# Patient Record
Sex: Male | Born: 1945
Health system: Southern US, Community
[De-identification: ages and names within clinical notes are randomized; demographics above are authoritative.]

## PROBLEM LIST (undated history)

## (undated) DIAGNOSIS — J45909 Unspecified asthma, uncomplicated: Secondary | ICD-10-CM

## (undated) DIAGNOSIS — T7840XA Allergy, unspecified, initial encounter: Secondary | ICD-10-CM

## (undated) DIAGNOSIS — I10 Essential (primary) hypertension: Secondary | ICD-10-CM

## (undated) DIAGNOSIS — Z973 Presence of spectacles and contact lenses: Secondary | ICD-10-CM

## (undated) DIAGNOSIS — F32A Depression, unspecified: Secondary | ICD-10-CM

## (undated) DIAGNOSIS — B999 Unspecified infectious disease: Secondary | ICD-10-CM

## (undated) DIAGNOSIS — Z974 Presence of external hearing-aid: Secondary | ICD-10-CM

## (undated) DIAGNOSIS — J342 Deviated nasal septum: Secondary | ICD-10-CM

## (undated) DIAGNOSIS — B1920 Unspecified viral hepatitis C without hepatic coma: Secondary | ICD-10-CM

## (undated) DIAGNOSIS — J189 Pneumonia, unspecified organism: Secondary | ICD-10-CM

## (undated) DIAGNOSIS — N189 Chronic kidney disease, unspecified: Secondary | ICD-10-CM

## (undated) DIAGNOSIS — F329 Major depressive disorder, single episode, unspecified: Secondary | ICD-10-CM

## (undated) DIAGNOSIS — D649 Anemia, unspecified: Secondary | ICD-10-CM

## (undated) HISTORY — DX: Depression, unspecified: F32.A

## (undated) HISTORY — PX: TONSILLECTOMY: SUR1361

## (undated) HISTORY — PX: NEPHRECTOMY: SHX65

## (undated) HISTORY — DX: Unspecified asthma, uncomplicated: J45.909

## (undated) HISTORY — PX: COLONOSCOPY W/ BIOPSIES AND POLYPECTOMY: SHX1376

## (undated) HISTORY — DX: Chronic kidney disease, unspecified: N18.9

## (undated) HISTORY — DX: Allergy, unspecified, initial encounter: T78.40XA

## (undated) HISTORY — PX: AV FISTULA PLACEMENT: SHX1204

## (undated) HISTORY — DX: Major depressive disorder, single episode, unspecified: F32.9

## (undated) HISTORY — PX: EYE SURGERY: SHX253

---

## 2000-04-18 ENCOUNTER — Encounter: Admission: RE | Admit: 2000-04-18 | Discharge: 2000-04-18 | Payer: Self-pay | Admitting: Sports Medicine

## 2000-05-02 ENCOUNTER — Encounter: Admission: RE | Admit: 2000-05-02 | Discharge: 2000-05-02 | Payer: Self-pay | Admitting: Family Medicine

## 2000-05-30 ENCOUNTER — Encounter: Admission: RE | Admit: 2000-05-30 | Discharge: 2000-05-30 | Payer: Self-pay | Admitting: Family Medicine

## 2002-10-19 ENCOUNTER — Ambulatory Visit (HOSPITAL_COMMUNITY): Admission: RE | Admit: 2002-10-19 | Discharge: 2002-10-19 | Payer: Self-pay | Admitting: Gastroenterology

## 2011-05-11 ENCOUNTER — Ambulatory Visit (INDEPENDENT_AMBULATORY_CARE_PROVIDER_SITE_OTHER): Payer: MEDICARE

## 2011-05-11 DIAGNOSIS — J111 Influenza due to unidentified influenza virus with other respiratory manifestations: Secondary | ICD-10-CM

## 2011-05-11 DIAGNOSIS — E119 Type 2 diabetes mellitus without complications: Secondary | ICD-10-CM

## 2011-08-07 ENCOUNTER — Ambulatory Visit (INDEPENDENT_AMBULATORY_CARE_PROVIDER_SITE_OTHER): Payer: MEDICARE | Admitting: Emergency Medicine

## 2011-08-07 VITALS — BP 105/68 | HR 69 | Temp 98.6°F | Resp 16 | Ht 67.0 in | Wt 176.8 lb

## 2011-08-07 DIAGNOSIS — N289 Disorder of kidney and ureter, unspecified: Secondary | ICD-10-CM

## 2011-08-07 DIAGNOSIS — N2889 Other specified disorders of kidney and ureter: Secondary | ICD-10-CM

## 2011-08-07 DIAGNOSIS — E213 Hyperparathyroidism, unspecified: Secondary | ICD-10-CM

## 2011-08-07 NOTE — Progress Notes (Signed)
  Subjective:    Patient ID: Scott Jones, male    DOB: 1946-01-23, 66 y.o.   MRN: [id number]  HPI patient here to discuss findings of an MRI that was done at the [facility]. He was found to have a 3.6 x 3.9 x 3.7 mass in left inferior pole of the kidney. He was advised to consider Cowles therapy to that area versus removal. He is concerned because he has diminished renal function. This is related to his hypertension and diabetes.    Review of Systems noncontributory     Objective:   Physical Exam Maran the kidney was patient has an 3 x 4 cm mass in the left kidney most consistent with renal cell cancer.        Assessment & Plan:  Assessment his renal mass in a patient with diabetes mellitus and diminished renal function. Will get an amateur referral to urology for their opinion. I suspect he will undergo nephrectomy and adjustment of his medications.

## 2011-08-16 ENCOUNTER — Telehealth: Payer: Self-pay

## 2011-08-16 NOTE — Telephone Encounter (Signed)
Pt is wanting to talk with dr Everlene Farrier about a third opinion on having a kidney removed   Please call [phone]

## 2011-08-16 NOTE — Telephone Encounter (Signed)
Dr. Everlene Farrier, what do you advise?

## 2011-08-17 NOTE — Telephone Encounter (Signed)
Please call [name] and ask him what facility he would like to go to. See if he wants to go to [facility] or [place].

## 2011-08-19 NOTE — Telephone Encounter (Signed)
lmom to cb and let us know what facility he would like to go to.

## 2011-08-19 NOTE — Telephone Encounter (Signed)
Pt wants to be referred to [facility] by April 2nd or the 12-15th of April. He will be out of town in between these 2 days.

## 2011-08-20 NOTE — Telephone Encounter (Signed)
Please make referral to [facility] April 12-15. For evaluation of a renal cell tumor.

## 2011-09-06 ENCOUNTER — Ambulatory Visit (INDEPENDENT_AMBULATORY_CARE_PROVIDER_SITE_OTHER): Payer: MEDICARE | Admitting: Family Medicine

## 2011-09-06 DIAGNOSIS — I10 Essential (primary) hypertension: Secondary | ICD-10-CM

## 2011-09-06 DIAGNOSIS — E119 Type 2 diabetes mellitus without complications: Secondary | ICD-10-CM

## 2011-09-06 MED ORDER — SITAGLIPTIN PHOSPHATE 50 MG PO TABS: 50.0000 mg | ORAL_TABLET | Freq: Every day | ORAL | Status: DC

## 2011-09-06 MED ORDER — HYDROCHLOROTHIAZIDE 25 MG PO TABS: 25.0000 mg | ORAL_TABLET | Freq: Every day | ORAL | Status: DC

## 2011-09-06 MED ORDER — NIFEDIPINE ER 90 MG PO TB24: 90.0000 mg | ORAL_TABLET | Freq: Every day | ORAL | Status: DC

## 2011-09-06 MED ORDER — GLIPIZIDE 5 MG PO TABS: 5.0000 mg | ORAL_TABLET | Freq: Two times a day (BID) | ORAL | Status: DC

## 2011-09-06 MED ORDER — LISINOPRIL 40 MG PO TABS: 40.0000 mg | ORAL_TABLET | Freq: Every day | ORAL | Status: DC

## 2011-09-06 NOTE — Progress Notes (Signed)
Subjective: Patient The patient is going to [place] tomorrow and needs his medicines refilled. When he returns from [place] he is going to undergo a partial nephrectomy in [place] for a malignancy of his kidney. Been running sugars of about 190. This in part is from restriction of medications to protect his kidney function until he has his surgery. His blood pressure is good today.  Objective: No acute distress. Chest clear. Heart regular without murmurs. Vitals as noted.  Plan assessment: Hypertension Diabetes Renal carcinoma History renal insufficiency  Plan: Re\re prescribe his medications.

## 2011-09-06 NOTE — Patient Instructions (Signed)
Monitor sugars carefully when you're traveling.

## 2011-10-08 HISTORY — PX: PARTIAL NEPHRECTOMY: SHX414

## 2011-11-03 ENCOUNTER — Encounter (HOSPITAL_COMMUNITY): Payer: Self-pay | Admitting: Emergency Medicine

## 2011-11-03 ENCOUNTER — Emergency Department (HOSPITAL_COMMUNITY)
Admission: EM | Admit: 2011-11-03 | Discharge: 2011-11-03 | Disposition: A | Discharge status: Home / Self Care | Payer: Medicare Other | Attending: Emergency Medicine | Admitting: Emergency Medicine

## 2011-11-03 DIAGNOSIS — B182 Chronic viral hepatitis C: Secondary | ICD-10-CM | POA: Insufficient documentation

## 2011-11-03 DIAGNOSIS — Z905 Acquired absence of kidney: Secondary | ICD-10-CM | POA: Insufficient documentation

## 2011-11-03 DIAGNOSIS — N390 Urinary tract infection, site not specified: Secondary | ICD-10-CM

## 2011-11-03 DIAGNOSIS — Z794 Long term (current) use of insulin: Secondary | ICD-10-CM | POA: Insufficient documentation

## 2011-11-03 DIAGNOSIS — Z79899 Other long term (current) drug therapy: Secondary | ICD-10-CM | POA: Insufficient documentation

## 2011-11-03 DIAGNOSIS — I10 Essential (primary) hypertension: Secondary | ICD-10-CM | POA: Insufficient documentation

## 2011-11-03 DIAGNOSIS — Z87891 Personal history of nicotine dependence: Secondary | ICD-10-CM | POA: Insufficient documentation

## 2011-11-03 DIAGNOSIS — E119 Type 2 diabetes mellitus without complications: Secondary | ICD-10-CM | POA: Insufficient documentation

## 2011-11-03 HISTORY — DX: Essential (primary) hypertension: I10

## 2011-11-03 HISTORY — DX: Unspecified viral hepatitis C without hepatic coma: B19.20

## 2011-11-03 LAB — URINALYSIS, ROUTINE W REFLEX MICROSCOPIC
Ketones, ur: 15 mg/dL — AB
Protein, ur: >300 mg/dL — AB
Urobilinogen, UA: 1 mg/dL (ref 0.0–1.0)

## 2011-11-03 MED ORDER — CIPROFLOXACIN HCL 500 MG PO TABS: 500.0000 mg | ORAL_TABLET | Freq: Two times a day (BID) | ORAL | Status: AC

## 2011-11-03 NOTE — ED Provider Notes (Signed)
History     CSN: [id number]  Arrival date & time 11/03/11  1528   First MD Initiated Contact with Patient 11/03/11 1646      Chief Complaint  Patient presents with  . Hematuria    3 weeks post partial nephrectomy  . Dysuria    concerned that blood clots may be causing difficulty urinating    (Consider location/radiation/quality/duration/timing/severity/associated sxs/prior treatment) HPI Comments: Kodah Maret Baum is a 66 y.o. Male who had a partial, left nephrectomy for a benign tumor, about 4 weeks ago. Since thenhe has had 2 episodes of hematuria. The last began today. He feels like the bleeding is improving somewhat. He has no fever, chills, nausea, vomiting, weakness, dizziness, or back pain. His bowel movements have been normal. He does not have any exacerbating or palliative factors.  Patient is a 66 y.o. male presenting with hematuria and dysuria. The history is provided by the patient.  Hematuria Associated symptoms include dysuria.  Dysuria  Associated symptoms include hematuria.    Past Medical History  Diagnosis Date  . Hypertension   . Diabetes mellitus   . Hepatitis C     Past Surgical History  Procedure Date  . Nephrectomy     partial - benign tumor    Family History  Problem Relation Age of Onset  . Diabetes Mother   . Hypertension Mother     History  Substance Use Topics  . Smoking status: Former Smoker -- 1.0 packs/day for 11 years    Types: Cigarettes, Pipe    Quit date: 08/07/1974  . Smokeless tobacco: Never Used  . Alcohol Use: Yes      Review of Systems  Genitourinary: Positive for dysuria and hematuria.  All other systems reviewed and are negative.    Allergies  Review of patient's allergies indicates no known allergies.  Home Medications   Current Outpatient Rx  Name Route Sig Dispense Refill  . ACETAMINOPHEN 500 MG PO TABS Oral Take 1,000 mg by mouth every 6 (six) hours as needed. For pain.    . ASPIRIN 81 MG PO TABS  Oral Take 81 mg by mouth daily.    Marland Kitchen CITALOPRAM HYDROBROMIDE 40 MG PO TABS Oral Take 20 mg by mouth daily.     Marland Kitchen GLIPIZIDE 5 MG PO TABS Oral Take 5 mg by mouth daily.    Marland Kitchen HYDROCHLOROTHIAZIDE 25 MG PO TABS Oral Take 1 tablet (25 mg total) by mouth daily. 30 tablet 1    Dispense as written.  Marland Kitchen LISINOPRIL 40 MG PO TABS Oral Take 1 tablet (40 mg total) by mouth daily. 30 tablet 1  . NIFEDIPINE ER 90 MG PO TB24 Oral Take 1 tablet (90 mg total) by mouth daily. 30 tablet 1  . SIMETHICONE 80 MG PO CHEW Oral Chew 80 mg by mouth every 6 (six) hours as needed. For gas.    Marland Kitchen SIMVASTATIN PO Oral Take 1 tablet by mouth daily.    Marland Kitchen SITAGLIPTIN PHOSPHATE 50 MG PO TABS Oral Take 1 tablet (50 mg total) by mouth daily. 30 tablet 1  . VITAMIN C 500 MG PO TABS Oral Take 500 mg by mouth daily.    Marland Kitchen CIPROFLOXACIN HCL 500 MG PO TABS Oral Take 1 tablet (500 mg total) by mouth every 12 (twelve) hours. 14 tablet 0    BP 134/76  Pulse 80  Temp 97.6 F (36.4 C) (Oral)  Resp 16  Ht 5\' 6"  (1.676 m)  Wt 175 lb (79.379 kg)  BMI  28.25 kg/m2  SpO2 100%  Physical Exam  Nursing note and vitals reviewed. Constitutional: He is oriented to person, place, and time. He appears well-developed and well-nourished.  HENT:  Head: Normocephalic and atraumatic.  Right Ear: External ear normal.  Left Ear: External ear normal.  Eyes: Conjunctivae and EOM are normal. Pupils are equal, round, and reactive to light.  Neck: Normal range of motion and phonation normal. Neck supple.  Cardiovascular: Normal rate, regular rhythm, normal heart sounds and intact distal pulses.   Pulmonary/Chest: Effort normal and breath sounds normal. He exhibits no bony tenderness.  Abdominal: Soft. Normal appearance. There is no tenderness.       Healing abdominal wall scars  Musculoskeletal: Normal range of motion.  Neurological: He is alert and oriented to person, place, and time. He has normal strength. No cranial nerve deficit or sensory deficit.  He exhibits normal muscle tone. Coordination normal.  Skin: Skin is warm, dry and intact.  Psychiatric: He has a normal mood and affect. His behavior is normal. Judgment and thought content normal.    ED Course  Procedures (including critical care time)   He was able to spontaneously void Reevaluation: 19:43-     Labs Reviewed  URINALYSIS, ROUTINE W REFLEX MICROSCOPIC - Abnormal; Notable for the following:    Color, Urine RED (*)  BIOCHEMICALS MAY BE AFFECTED BY COLOR   APPearance TURBID (*)     Specific Gravity, Urine 1.033 (*)     Hgb urine dipstick LARGE (*)     Bilirubin Urine LARGE (*)     Ketones, ur 15 (*)     Protein, ur >300 (*)     Nitrite POSITIVE (*)     Leukocytes, UA LARGE (*)     All other components within normal limits  URINE MICROSCOPIC-ADD ON - Abnormal; Notable for the following:    Squamous Epithelial / LPF FEW (*)     Bacteria, UA MANY (*)     All other components within normal limits  URINE CULTURE   No results found.   1. UTI (lower urinary tract infection)       MDM  Para UTI, status post recent instrumentation and partial, nephrectomy. Doubt metabolic instability, serious bacterial infection or impending vascular collapse; the patient is stable for discharge.    Plan: Home Medications- Cipro; Home Treatments- fluids; Recommended follow up- Urology in 1 week        Richarda Blade, MD 11/03/11 2345

## 2011-11-03 NOTE — ED Notes (Signed)
Pt reports dysuria, "dull" pain with urination with red "clots" present. States he had partial nephrectomy 1 mo ago d/t benign tumor.

## 2011-11-03 NOTE — ED Notes (Signed)
Pt reports he voided 20 min ago, passed the remainder of blood, and "feels much better".

## 2011-11-03 NOTE — Discharge Instructions (Signed)

## 2011-11-03 NOTE — ED Notes (Signed)
Pt reports 2 nd episode of hematuria post operative. Pt reports passing clots with previous episode. Having difficulty urinating today

## 2011-11-05 LAB — URINE CULTURE
Colony Count: 5000
Culture  Setup Time: 201306130128

## 2012-02-02 ENCOUNTER — Ambulatory Visit (INDEPENDENT_AMBULATORY_CARE_PROVIDER_SITE_OTHER): Payer: MEDICARE | Admitting: Family Medicine

## 2012-02-02 ENCOUNTER — Encounter: Payer: Self-pay | Admitting: Family Medicine

## 2012-02-02 VITALS — BP 120/76 | HR 66 | Temp 98.1°F | Resp 16 | Ht 66.0 in | Wt 187.2 lb

## 2012-02-02 DIAGNOSIS — J029 Acute pharyngitis, unspecified: Secondary | ICD-10-CM

## 2012-02-02 DIAGNOSIS — E119 Type 2 diabetes mellitus without complications: Secondary | ICD-10-CM | POA: Insufficient documentation

## 2012-02-02 DIAGNOSIS — R059 Cough, unspecified: Secondary | ICD-10-CM

## 2012-02-02 DIAGNOSIS — J069 Acute upper respiratory infection, unspecified: Secondary | ICD-10-CM

## 2012-02-02 DIAGNOSIS — R05 Cough: Secondary | ICD-10-CM

## 2012-02-02 DIAGNOSIS — I10 Essential (primary) hypertension: Secondary | ICD-10-CM | POA: Insufficient documentation

## 2012-02-02 MED ORDER — HYDROCODONE-HOMATROPINE 5-1.5 MG/5ML PO SYRP: 5.0000 mL | ORAL_SOLUTION | Freq: Three times a day (TID) | ORAL | Status: AC | PRN

## 2012-02-02 NOTE — Progress Notes (Signed)
Subjective: 66 year old man who is here with a history of having upper Sartori infection and cough since yesterday. He's been having runny nose, drainage, coughing. He has a sore throat. He doesn't know where he caught this. He does not smoke. He is diabetic and hypertensive. He had surgery in May of this year for a renal tumor, with a partial nephrectomy, and it was benign.  Objective: TMs are normal. Nose inflamed. Throat erythematous without exudate. Neck supple without significant nodes. Chest clear to auscultation. Heart regular without murmurs.  Assessment: Probable URI. Doubt just allergies.  Plan: With pharyngitis we'll check a strep screen and proceed from there   Results for orders placed in visit on 02/02/12  POCT RAPID STREP A (OFFICE)      Component Value Range   Rapid Strep A Screen Negative  Negative   Will rx cough syrup  Call if worse purulent phlegm and I will rx abx.

## 2012-02-02 NOTE — Patient Instructions (Addendum)
Upper Respiratory Infection, Adult An upper respiratory infection (URI) is also known as the common cold. It is often caused by a type of germ (virus). Colds are easily spread (contagious). You can pass it to others by kissing, coughing, sneezing, or drinking out of the same glass. Usually, you get better in 1 or 2 weeks.  HOME CARE   Only take medicine as told by your doctor.   Use a warm mist humidifier or breathe in steam from a hot shower.   Drink enough water and fluids to keep your pee (urine) clear or pale yellow.   Get plenty of rest.   Return to work when your temperature is back to normal or as told by your doctor. You may use a face mask and wash your hands to stop your cold from spreading.  GET HELP RIGHT AWAY IF:   After the first few days, you feel you are getting worse.   You have questions about your medicine.   You have chills, shortness of breath, or brown or red spit (mucus).   You have yellow or brown snot (nasal discharge) or pain in the face, especially when you bend forward.   You have a fever, puffy (swollen) neck, pain when you swallow, or white spots in the back of your throat.   You have a bad headache, ear pain, sinus pain, or chest pain.   You have a high-pitched whistling sound when you breathe in and out (wheezing).   You have a lasting cough or cough up blood.   You have sore muscles or a stiff neck.  MAKE SURE YOU:   Understand these instructions.   Will watch your condition.   Will get help right away if you are not doing well or get worse.  Document Released: 10/27/2007 Document Revised: 04/29/2011 Document Reviewed: 09/14/2010 [facility] Patient Information 2012 [place].

## 2012-02-23 ENCOUNTER — Other Ambulatory Visit: Payer: Self-pay | Admitting: Family Medicine

## 2012-02-24 ENCOUNTER — Other Ambulatory Visit: Payer: Self-pay | Admitting: Family Medicine

## 2012-04-13 ENCOUNTER — Ambulatory Visit (INDEPENDENT_AMBULATORY_CARE_PROVIDER_SITE_OTHER): Payer: MEDICARE | Admitting: Family Medicine

## 2012-04-13 VITALS — BP 136/84 | HR 76 | Temp 98.2°F | Resp 18 | Ht 67.0 in | Wt 188.0 lb

## 2012-04-13 DIAGNOSIS — I10 Essential (primary) hypertension: Secondary | ICD-10-CM

## 2012-04-13 DIAGNOSIS — F329 Major depressive disorder, single episode, unspecified: Secondary | ICD-10-CM

## 2012-04-13 DIAGNOSIS — E785 Hyperlipidemia, unspecified: Secondary | ICD-10-CM

## 2012-04-13 DIAGNOSIS — F32A Depression, unspecified: Secondary | ICD-10-CM

## 2012-04-13 DIAGNOSIS — F3289 Other specified depressive episodes: Secondary | ICD-10-CM

## 2012-04-13 MED ORDER — SERTRALINE HCL 100 MG PO TABS: 100.0000 mg | ORAL_TABLET | Freq: Every day | ORAL | Status: DC

## 2012-04-13 MED ORDER — SIMVASTATIN 40 MG PO TABS: 40.0000 mg | ORAL_TABLET | Freq: Every evening | ORAL | Status: DC

## 2012-04-13 MED ORDER — LISINOPRIL 40 MG PO TABS: 40.0000 mg | ORAL_TABLET | Freq: Every day | ORAL | Status: DC

## 2012-04-13 NOTE — Progress Notes (Signed)
[facility]:  Office Visit  Chief Complaint:  Chief Complaint  Patient presents with  . Medication Refill    Lisinopril, Sitalagliptin, Simvastatin, Sertaline    HPI: Scott Jones is a 66 y.o. male who complains of  Medication refills. Leaving to go out to [place] for 2 weeks. Gets meds through [place].  Recent labs about 1 month ago. He has some elevated creatinine, going to see a kidney specialist by [facility], has stopped metformin. He is still on lisinopril ONly needs 30 days until he gets his mail order meds from [place]. He is doing well and not having any SEs.  Past Medical History  Diagnosis Date  . Hypertension   . Diabetes mellitus   . Hepatitis C   . Asthma   . Depression   . Chronic kidney disease    Past Surgical History  Procedure Date  . Nephrectomy     partial - benign tumor  . Partial nephrectomy 10/08/2011    Left kidney   History   Social History  . Marital Status: Married    Spouse Name: N/A    Number of Children: N/A  . Years of Education: N/A   Social History Main Topics  . Smoking status: Former Smoker -- 1.0 packs/day for 11 years    Types: Cigarettes, Pipe    Quit date: 08/07/1974  . Smokeless tobacco: Never Used  . Alcohol Use: Yes  . Drug Use: None  . Sexually Active: None   Other Topics Concern  . None   Social History Narrative  . None   Family History  Problem Relation Age of Onset  . Diabetes Mother   . Hypertension Mother   . Diabetes Father    No Known Allergies Prior to Admission medications   Medication Sig Start Date End Date Taking? Authorizing Provider  aspirin 81 MG tablet Take 81 mg by mouth daily.   Yes Historical Provider, MD  cholecalciferol (VITAMIN D) 1000 UNITS tablet Take 1,000 Units by mouth daily.   Yes Historical Provider, MD  glipiZIDE (GLUCOTROL) 5 MG tablet TAKE ONE TABLET BY MOUTH TWICE DAILY BEFORE A MEAL 02/24/12  Yes Dionne Bucy McClung, PA-C  hydrochlorothiazide (HYDRODIURIL) 25 MG tablet Take 1  tablet (25 mg total) by mouth daily. 09/06/11  Yes Posey Boyer, MD  lisinopril (PRINIVIL,ZESTRIL) 40 MG tablet Take 1 tablet (40 mg total) by mouth daily. 09/06/11  Yes Posey Boyer, MD  NIFEdipine (ADALAT CC) 90 MG 24 hr tablet Take 1 tablet (90 mg total) by mouth daily. 09/06/11  Yes Posey Boyer, MD  sertraline (ZOLOFT) 100 MG tablet Take 100 mg by mouth daily.   Yes Historical Provider, MD  SIMVASTATIN PO Take 40 mg by mouth daily. Take 1/2 tablet at bedtime   Yes Historical Provider, MD  sitaGLIPtin (JANUVIA) 50 MG tablet Take 1 tablet (50 mg total) by mouth daily. 09/06/11  Yes Posey Boyer, MD  vitamin C (ASCORBIC ACID) 500 MG tablet Take 500 mg by mouth daily.   Yes Historical Provider, MD  acetaminophen (TYLENOL) 500 MG tablet Take 1,000 mg by mouth every 6 (six) hours as needed. For pain.    Historical Provider, MD  citalopram (CELEXA) 40 MG tablet Take 20 mg by mouth daily.     Historical Provider, MD  simethicone (MYLICON) 80 MG chewable tablet Chew 80 mg by mouth every 6 (six) hours as needed. For gas.    Historical Provider, MD  ROS: The patient denies fevers, chills, night sweats, unintentional weight loss, chest pain, palpitations, wheezing, dyspnea on exertion, nausea, vomiting, abdominal pain, dysuria, hematuria, melena, numbness, weakness, or tingling.   All other systems have been reviewed and were otherwise negative with the exception of those mentioned in the HPI and as above.    PHYSICAL EXAM: Filed Vitals:   04/13/12 1816  BP: 136/84  Pulse: 76  Temp: 98.2 F (36.8 C)  Resp: 18   Filed Vitals:   04/13/12 1816  Height: $Remove'5\' 7"'nhNusVc$  (1.702 m)  Weight: 188 lb (85.276 kg)   Body mass index is 29.44 kg/(m^2).  General: Alert, no acute distress HEENT:  Normocephalic, atraumatic, oropharynx patent.  Cardiovascular:  Regular rate and rhythm, no rubs murmurs or gallops.  No Carotid bruits, radial pulse intact. No pedal edema.  Respiratory: Clear to auscultation  bilaterally.  No wheezes, rales, or rhonchi.  No cyanosis, no use of accessory musculature GI: No organomegaly, abdomen is soft and non-tender, positive bowel sounds.  No masses. Skin: No rashes. Neurologic: Facial musculature symmetric. Psychiatric: Patient is appropriate throughout our interaction. Lymphatic: No cervical lymphadenopathy Musculoskeletal: Gait intact.   LABS: Results for orders placed in visit on 02/02/12  POCT RAPID STREP A (OFFICE)      Component Value Range   Rapid Strep A Screen Negative  Negative     EKG/XRAY:   Primary read interpreted by Dr. Marin Comment at [facility].   ASSESSMENT/PLAN: Encounter Diagnoses  Name Primary?  . HTN (hypertension) Yes  . Hyperlipidemia   . Depression    Refill on Zoloft, Simvastatin, and Lisinopril x 30 days, no refills F/u prn    LE, [place], DO 04/13/2012 7:21 PM

## 2012-05-09 ENCOUNTER — Ambulatory Visit: Payer: MEDICARE

## 2012-05-09 ENCOUNTER — Ambulatory Visit (INDEPENDENT_AMBULATORY_CARE_PROVIDER_SITE_OTHER): Payer: MEDICARE | Admitting: Family Medicine

## 2012-05-09 VITALS — BP 132/82 | HR 67 | Temp 98.6°F | Resp 17 | Ht 65.0 in | Wt 184.0 lb

## 2012-05-09 DIAGNOSIS — M7020 Olecranon bursitis, unspecified elbow: Secondary | ICD-10-CM

## 2012-05-09 DIAGNOSIS — S59909A Unspecified injury of unspecified elbow, initial encounter: Secondary | ICD-10-CM

## 2012-05-09 DIAGNOSIS — M7030 Other bursitis of elbow, unspecified elbow: Secondary | ICD-10-CM

## 2012-05-09 DIAGNOSIS — S6990XA Unspecified injury of unspecified wrist, hand and finger(s), initial encounter: Secondary | ICD-10-CM

## 2012-05-09 NOTE — Progress Notes (Signed)
Subjective: Patient fell several weeks ago and his right elbow. It hurt but gradually resolved. It has started swelling some and today it swelled up badly. It has not been painful or inflamed.  Objective:  Slightly swollen olecranon bursa left elbow only minimally tender.  [facility] reading (PRIMARY) by  Dr. Linna Darner Normal elbow    Procedure Lidocaine anesthetic. Aspirated using a 20-gauge needle getting about 10 cc of fluid which was bloody. This was sent for cell count and crystal analysis. With no inflammation of the tissues I decided not to do cultures. Injected with 1/2 cc of Depo-Medrol 80 (40 mg )   Assessment: Olecranon bursitis    Plan: Admit off not doing better.

## 2012-05-10 LAB — SYNOVIAL CELL COUNT + DIFF, W/ CRYSTALS
Crystals, Fluid: NONE SEEN
Eosinophils-Synovial: 2 % — ABNORMAL HIGH (ref 0–1)
Lymphocytes-Synovial Fld: 6 % (ref 0–20)
Monocyte/Macrophage: 51 % (ref 50–90)

## 2012-06-21 ENCOUNTER — Other Ambulatory Visit: Payer: Self-pay | Admitting: Family Medicine

## 2012-08-03 ENCOUNTER — Telehealth: Payer: Self-pay

## 2012-08-03 NOTE — Telephone Encounter (Signed)
PT STATES HE WAS GIVEN A PRESCRIPTION FOR CILIAS IN 2012, BUT NEVER GOT IT FILLED, BUT WOULD LIKE TO HAVE IT CALLED IN NOW. WOULD LIKE TO HAVE THE $Remove'20MG'triJtkG$ S. PLEASE CALL [phone]    [facility] ON [address]

## 2012-08-04 NOTE — Telephone Encounter (Signed)
It looks like pt has DM and he has not had labs for that.  Who is his PCP?  If it is [place] who takes care of his DM?  I think it would be best for him to have an OV.

## 2012-08-07 ENCOUNTER — Ambulatory Visit (INDEPENDENT_AMBULATORY_CARE_PROVIDER_SITE_OTHER): Payer: Medicare Other | Admitting: Family Medicine

## 2012-08-07 VITALS — BP 118/70 | HR 80 | Temp 98.0°F | Resp 18 | Wt 184.0 lb

## 2012-08-07 DIAGNOSIS — N529 Male erectile dysfunction, unspecified: Secondary | ICD-10-CM

## 2012-08-07 MED ORDER — TADALAFIL 20 MG PO TABS: 20.0000 mg | ORAL_TABLET | ORAL | Status: DC | PRN

## 2012-08-07 NOTE — Progress Notes (Signed)
Scott Jones is a 67 y.o. male who presents to Urgent Care today with complaints of ED:  1.  Erectile dysfunction:  Patient has PMH signficant for ED treated with Cialis 10 mg in the past.  Last refill was provided over a year ago.  He experienced relief of ED after use of Cialis but does have some concern that perhaps the dosage was too low as he had trouble keeping an erection at times.  Does awaken with AM erections.  He is monogamous with his wife.  No history of STDs.  No history of heart trouble or angina.  Walks for exercise without problems.  Not taking a nitrate.   PMH reviewed.  Past Medical History  Diagnosis Date  . Hypertension   . Diabetes mellitus   . Hepatitis C   . Asthma   . Depression   . Chronic kidney disease   . Allergy    Past Surgical History  Procedure Laterality Date  . Nephrectomy      partial - benign tumor  . Partial nephrectomy  10/08/2011    Left kidney    Medications reviewed. Current Outpatient Prescriptions  Medication Sig Dispense Refill  . aspirin 81 MG tablet Take 81 mg by mouth daily.      . cholecalciferol (VITAMIN D) 1000 UNITS tablet Take 1,000 Units by mouth daily.      Marland Kitchen glipiZIDE (GLUCOTROL) 5 MG tablet TAKE ONE TABLET BY MOUTH TWICE DAILY BEFORE A MEAL  60 tablet  0  . hydrochlorothiazide (HYDRODIURIL) 25 MG tablet Take 1 tablet (25 mg total) by mouth daily.  30 tablet  1  . lisinopril (PRINIVIL,ZESTRIL) 40 MG tablet Take 1 tablet (40 mg total) by mouth daily.  30 tablet  0  . NIFEdipine (ADALAT CC) 90 MG 24 hr tablet Take 1 tablet (90 mg total) by mouth daily.  30 tablet  1  . saxagliptin HCl (ONGLYZA) 2.5 MG TABS tablet Take 2.5 mg by mouth daily.      . sertraline (ZOLOFT) 100 MG tablet TAKE ONE TABLET BY MOUTH EVERY DAY  30 tablet  0  . simvastatin (ZOCOR) 40 MG tablet Take 1 tablet (40 mg total) by mouth every evening.  30 tablet  0  . vitamin C (ASCORBIC ACID) 500 MG tablet Take 500 mg by mouth daily.       No current  facility-administered medications for this visit.    ROS as above otherwise neg.     Physical Exam:  BP 118/70  Pulse 80  Temp(Src) 98 F (36.7 C) (Oral)  Resp 18  Wt 184 lb (83.462 kg)  BMI 30.62 kg/m2 Gen:  Alert, cooperative patient who appears stated age in no acute distress.  Vital signs reviewed. HEENT: EOMI,  MMM Pulm:  Clear to auscultation bilaterally with good air movement.  No wheezes or rales noted.   Cardiac:  Regular rate and rhythm without murmur auscultated.  Good S1/S2. GU:  Deferred  Assessment and Plan:  1.  ED - No red flags by history or physical for this medication - Although hx/o of HTN, no history of angina and does walk for exercise regularly.   - Provided 10 pills with refills.  - Dosed at 20 mg.   - FU PRN

## 2012-08-07 NOTE — Telephone Encounter (Signed)
Left message, needs office visit.

## 2012-08-15 NOTE — Progress Notes (Signed)
Reviewed and agree.  Kristi M.Tamala Julian, MD

## 2012-10-22 ENCOUNTER — Telehealth: Payer: Self-pay

## 2012-10-22 DIAGNOSIS — E785 Hyperlipidemia, unspecified: Secondary | ICD-10-CM

## 2012-10-22 DIAGNOSIS — I10 Essential (primary) hypertension: Secondary | ICD-10-CM

## 2012-10-22 NOTE — Telephone Encounter (Signed)
Patient called requesting refill on the following meds:hydrochlorothiazide,simvastatin and zoloft. He would like medications sent to [facility] $[email or web address]$ . Any further questions patient can be reached at [phone]. Thanks

## 2012-10-24 MED ORDER — SERTRALINE HCL 100 MG PO TABS: ORAL_TABLET | ORAL | Status: DC

## 2012-10-24 MED ORDER — HYDROCHLOROTHIAZIDE 25 MG PO TABS: 25.0000 mg | ORAL_TABLET | Freq: Every day | ORAL | Status: DC

## 2012-10-24 MED ORDER — SIMVASTATIN 40 MG PO TABS: 40.0000 mg | ORAL_TABLET | Freq: Every evening | ORAL | Status: DC

## 2012-10-24 NOTE — Telephone Encounter (Signed)
Done. Needs office visit

## 2012-10-24 NOTE — Telephone Encounter (Signed)
Please advise. Pended.  

## 2012-11-10 ENCOUNTER — Ambulatory Visit (INDEPENDENT_AMBULATORY_CARE_PROVIDER_SITE_OTHER): Payer: Medicare Other | Admitting: Family Medicine

## 2012-11-10 VITALS — BP 140/80 | HR 60 | Temp 98.0°F | Resp 18 | Ht 66.0 in | Wt 184.3 lb

## 2012-11-10 DIAGNOSIS — S61409A Unspecified open wound of unspecified hand, initial encounter: Secondary | ICD-10-CM

## 2012-11-10 DIAGNOSIS — S61412A Laceration without foreign body of left hand, initial encounter: Secondary | ICD-10-CM

## 2012-11-10 NOTE — Patient Instructions (Addendum)
Take care and let us know if you have any sign of infection of your wound.  Keep the wound clean and covered.  The strips will come off on their own- probably in a few days.  If the strips seem to come off too soon you can apply some more.  Keep the wound covered so the edge does not catch and pull open.    Any problems please get in touch!

## 2012-11-10 NOTE — Progress Notes (Signed)
[facility] [address], [place] [address] [phone]  Date:  11/10/2012   Name:  Scott Jones   DOB:  [birth year]   MRN:  [id number]  PCP:  No primary provider on file.    Chief Complaint: Laceration   History of Present Illness:  Scott Jones is a 67 y.o. very pleasant male patient who presents with the following:  He cut his left hypothenar eminence last night with a blade (part of his paininting equipment)- he is not sure if he needs stitches or any other care.  He does oil paintings.  The wound bled but has now stopped, and he is not sure if it is very deep.  Last tetanus shot was 1 or 2 years ago per the [place].  He is otherwise in his usual state of health today  Patient Active Problem List   Diagnosis Date Noted  . DM (diabetes mellitus) 02/02/2012  . HTN (hypertension) 02/02/2012    Past Medical History  Diagnosis Date  . Hypertension   . Diabetes mellitus   . Hepatitis C   . Asthma   . Depression   . Chronic kidney disease   . Allergy     Past Surgical History  Procedure Laterality Date  . Nephrectomy      partial - benign tumor  . Partial nephrectomy  10/08/2011    Left kidney    History  Substance Use Topics  . Smoking status: Former Smoker -- 1.00 packs/day for 11 years    Types: Cigarettes, Pipe    Quit date: 08/07/1974  . Smokeless tobacco: Never Used  . Alcohol Use: 1.8 oz/week    3 Cans of beer per week    Family History  Problem Relation Age of Onset  . Diabetes Mother   . Hypertension Mother   . COPD Mother   . Asthma Mother   . Diabetes Father   . Diabetes Sister   . Diabetes Brother     No Known Allergies  Medication list has been reviewed and updated.  Current Outpatient Prescriptions on File Prior to Visit  Medication Sig Dispense Refill  . aspirin 81 MG tablet Take 81 mg by mouth daily.      . cholecalciferol (VITAMIN D) 1000 UNITS tablet Take 1,000 Units by mouth daily.      Marland Kitchen  glipiZIDE (GLUCOTROL) 5 MG tablet TAKE ONE TABLET BY MOUTH TWICE DAILY BEFORE A MEAL  60 tablet  0  . hydrochlorothiazide (HYDRODIURIL) 25 MG tablet Take 1 tablet (25 mg total) by mouth daily.  30 tablet  0  . lisinopril (PRINIVIL,ZESTRIL) 40 MG tablet Take 1 tablet (40 mg total) by mouth daily.  30 tablet  0  . NIFEdipine (ADALAT CC) 90 MG 24 hr tablet Take 1 tablet (90 mg total) by mouth daily.  30 tablet  1  . saxagliptin HCl (ONGLYZA) 2.5 MG TABS tablet Take 2.5 mg by mouth daily.      . sertraline (ZOLOFT) 100 MG tablet TAKE ONE TABLET BY MOUTH EVERY DAY  30 tablet  0  . simvastatin (ZOCOR) 40 MG tablet Take 1 tablet (40 mg total) by mouth every evening.  30 tablet  0  . tadalafil (CIALIS) 20 MG tablet Take 1 tablet (20 mg total) by mouth every other day as needed for erectile dysfunction.  10 tablet  5  . vitamin C (ASCORBIC ACID) 500 MG tablet Take 500 mg by mouth daily.  No current facility-administered medications on file prior to visit.    Review of Systems:  As per HPI- otherwise negative.   Physical Examination: Filed Vitals:   11/10/12 1205  BP: 140/80  Pulse: 60  Temp: 98 F (36.7 C)  Resp: 18   Filed Vitals:   11/10/12 1205  Height: $Remove'5\' 6"'ZBzurAE$  (1.676 m)  Weight: 184 lb 4.8 oz (83.598 kg)   Body mass index is 29.76 kg/(m^2). Ideal Body Weight: Weight in (lb) to have BMI = 25: 154.6   GEN: WDWN, NAD, Non-toxic, Alert & Oriented x 3, overweight HEENT: Atraumatic, Normocephalic.  Ears and Nose: No external deformity. EXTR: No clubbing/cyanosis/edema NEURO: Normal gait.  PSYCH: Normally interactive. Conversant. Not depressed or anxious appearing.  Calm demeanor.  Left hypothenar eminence- there is an approx 2 cm very shallow, clean and straight laceration.  Thumb and finger strength and sensation tested- normal.  Normal perfusion.  No evidence of damage to any deeper structure Wound was washed with soap and water, then closed with steri- strips and dressed.  No  evidence of any infection.    Assessment and Plan: Laceration of hand, left, initial encounter  Cleaned and closed with steri- strips.  Tetanus is UTD.  Keep wound protected, follow- up if any concerns   Signed Lamar Blinks, MD

## 2012-12-08 ENCOUNTER — Ambulatory Visit (INDEPENDENT_AMBULATORY_CARE_PROVIDER_SITE_OTHER): Payer: Medicare Other | Admitting: Emergency Medicine

## 2012-12-08 VITALS — BP 112/62 | HR 70 | Temp 98.8°F | Resp 18 | Ht 67.0 in | Wt 184.0 lb

## 2012-12-08 DIAGNOSIS — S61219A Laceration without foreign body of unspecified finger without damage to nail, initial encounter: Secondary | ICD-10-CM

## 2012-12-08 DIAGNOSIS — S61209A Unspecified open wound of unspecified finger without damage to nail, initial encounter: Secondary | ICD-10-CM

## 2012-12-08 NOTE — Progress Notes (Signed)
[facility] [address] [place] [address] [phone]  Date:  12/08/2012   Name:  Scott Jones   DOB:  1945/10/06   MRN:  [id number]  PCP:  No primary provider on file.    Chief Complaint: injury to finger   History of Present Illness:  Scott Jones is a 67 y.o. very pleasant male patient who presents with the following:  Using a chain saw and cut the tip of his fourth finger with the blade.  Current on tetanus.  No improvement with over the counter medications or other home remedies. Denies other complaint or health concern today.   Patient Active Problem List   Diagnosis Date Noted  . DM (diabetes mellitus) 02/02/2012  . HTN (hypertension) 02/02/2012    Past Medical History  Diagnosis Date  . Hypertension   . Diabetes mellitus   . Hepatitis C   . Asthma   . Depression   . Chronic kidney disease   . Allergy     Past Surgical History  Procedure Laterality Date  . Nephrectomy      partial - benign tumor  . Partial nephrectomy  10/08/2011    Left kidney    History  Substance Use Topics  . Smoking status: Former Smoker -- 1.00 packs/day for 11 years    Types: Cigarettes, Pipe    Quit date: 08/07/1974  . Smokeless tobacco: Never Used  . Alcohol Use: 1.8 oz/week    3 Cans of beer per week    Family History  Problem Relation Age of Onset  . Diabetes Mother   . Hypertension Mother   . COPD Mother   . Asthma Mother   . Diabetes Father   . Diabetes Sister   . Diabetes Brother     No Known Allergies  Medication list has been reviewed and updated.  Current Outpatient Prescriptions on File Prior to Visit  Medication Sig Dispense Refill  . aspirin 81 MG tablet Take 81 mg by mouth daily.      . cholecalciferol (VITAMIN D) 1000 UNITS tablet Take 1,000 Units by mouth daily.      Marland Kitchen glipiZIDE (GLUCOTROL) 5 MG tablet TAKE ONE TABLET BY MOUTH TWICE DAILY BEFORE A MEAL  60 tablet  0  . hydrochlorothiazide (HYDRODIURIL) 25 MG  tablet Take 1 tablet (25 mg total) by mouth daily.  30 tablet  0  . lisinopril (PRINIVIL,ZESTRIL) 40 MG tablet Take 1 tablet (40 mg total) by mouth daily.  30 tablet  0  . NIFEdipine (ADALAT CC) 90 MG 24 hr tablet Take 1 tablet (90 mg total) by mouth daily.  30 tablet  1  . saxagliptin HCl (ONGLYZA) 2.5 MG TABS tablet Take 2.5 mg by mouth daily.      . sertraline (ZOLOFT) 100 MG tablet TAKE ONE TABLET BY MOUTH EVERY DAY  30 tablet  0  . simvastatin (ZOCOR) 40 MG tablet Take 1 tablet (40 mg total) by mouth every evening.  30 tablet  0  . tadalafil (CIALIS) 20 MG tablet Take 1 tablet (20 mg total) by mouth every other day as needed for erectile dysfunction.  10 tablet  5  . vitamin C (ASCORBIC ACID) 500 MG tablet Take 500 mg by mouth daily.       No current facility-administered medications on file prior to visit.    Review of Systems:  As per HPI, otherwise negative.    Physical Examination: Filed Vitals:  12/08/12 1726  BP: 112/62  Pulse: 70  Temp: 98.8 F (37.1 C)  Resp: 18   Filed Vitals:   12/08/12 1726  Height: $Remove'5\' 7"'QMXXspB$  (1.702 m)  Weight: 184 lb (83.462 kg)   Body mass index is 28.81 kg/(m^2). Ideal Body Weight: Weight in (lb) to have BMI = 25: 159.3   GEN: WDWN, NAD, Non-toxic, Alert & Oriented x 3 HEENT: Atraumatic, Normocephalic.  Ears and Nose: No external deformity. EXTR: No clubbing/cyanosis/edema NEURO: Normal gait.  PSYCH: Normally interactive. Conversant. Not depressed or anxious appearing.  Calm demeanor.  LEFT hand fourth finger tip laceration with proximal based flap.  NATI no FB.  Wound 1 mm x 3 mm.    Assessment and Plan: Laceration left fourth finger Treated with dermabond   Signed,  Ellison Carwin, MD

## 2012-12-08 NOTE — Patient Instructions (Addendum)

## 2013-01-02 ENCOUNTER — Encounter (HOSPITAL_COMMUNITY): Payer: Self-pay | Admitting: Emergency Medicine

## 2013-01-02 ENCOUNTER — Emergency Department (HOSPITAL_COMMUNITY): Payer: Medicare Other

## 2013-01-02 ENCOUNTER — Emergency Department (HOSPITAL_COMMUNITY)
Admission: EM | Admit: 2013-01-02 | Discharge: 2013-01-02 | Disposition: A | Discharge status: Home / Self Care | Payer: Medicare Other | Attending: Emergency Medicine | Admitting: Emergency Medicine

## 2013-01-02 DIAGNOSIS — R0989 Other specified symptoms and signs involving the circulatory and respiratory systems: Secondary | ICD-10-CM | POA: Insufficient documentation

## 2013-01-02 DIAGNOSIS — I129 Hypertensive chronic kidney disease with stage 1 through stage 4 chronic kidney disease, or unspecified chronic kidney disease: Secondary | ICD-10-CM | POA: Insufficient documentation

## 2013-01-02 DIAGNOSIS — J40 Bronchitis, not specified as acute or chronic: Secondary | ICD-10-CM | POA: Insufficient documentation

## 2013-01-02 DIAGNOSIS — Z79899 Other long term (current) drug therapy: Secondary | ICD-10-CM | POA: Insufficient documentation

## 2013-01-02 DIAGNOSIS — F3289 Other specified depressive episodes: Secondary | ICD-10-CM | POA: Insufficient documentation

## 2013-01-02 DIAGNOSIS — J45909 Unspecified asthma, uncomplicated: Secondary | ICD-10-CM | POA: Insufficient documentation

## 2013-01-02 DIAGNOSIS — J3489 Other specified disorders of nose and nasal sinuses: Secondary | ICD-10-CM | POA: Insufficient documentation

## 2013-01-02 DIAGNOSIS — Z8619 Personal history of other infectious and parasitic diseases: Secondary | ICD-10-CM | POA: Insufficient documentation

## 2013-01-02 DIAGNOSIS — F329 Major depressive disorder, single episode, unspecified: Secondary | ICD-10-CM | POA: Insufficient documentation

## 2013-01-02 DIAGNOSIS — N189 Chronic kidney disease, unspecified: Secondary | ICD-10-CM | POA: Insufficient documentation

## 2013-01-02 DIAGNOSIS — R0789 Other chest pain: Secondary | ICD-10-CM | POA: Insufficient documentation

## 2013-01-02 DIAGNOSIS — E119 Type 2 diabetes mellitus without complications: Secondary | ICD-10-CM | POA: Insufficient documentation

## 2013-01-02 DIAGNOSIS — Z7982 Long term (current) use of aspirin: Secondary | ICD-10-CM | POA: Insufficient documentation

## 2013-01-02 DIAGNOSIS — Z87891 Personal history of nicotine dependence: Secondary | ICD-10-CM | POA: Insufficient documentation

## 2013-01-02 MED ORDER — GUAIFENESIN ER 600 MG PO TB12: 1200.0000 mg | ORAL_TABLET | Freq: Once | ORAL | Status: DC

## 2013-01-02 MED ORDER — GUAIFENESIN ER 600 MG PO TB12
1200.0000 mg | ORAL_TABLET | Freq: Once | ORAL | Status: AC
  Administered 2013-01-02: 1200 mg via ORAL
  Filled 2013-01-02: qty 2

## 2013-01-02 MED ORDER — BENZONATATE 100 MG PO CAPS
200.0000 mg | ORAL_CAPSULE | Freq: Once | ORAL | Status: AC
  Administered 2013-01-02: 200 mg via ORAL
  Filled 2013-01-02 (×2): qty 1

## 2013-01-02 MED ORDER — BENZONATATE 200 MG PO CAPS: 200.0000 mg | ORAL_CAPSULE | Freq: Three times a day (TID) | ORAL | Status: DC | PRN

## 2013-01-02 NOTE — ED Notes (Signed)
PT. REPORTS PRODUCTIVE COUGH , CHEST CONGESTION AND RUNNY NOSE FOR SEVERAL DAYS , DENIES FEVER OR CHILLS .

## 2013-01-02 NOTE — ED Provider Notes (Signed)
CSN: [id number]     Arrival date & time 01/02/13  0150 History     First MD Initiated Contact with Patient 01/02/13 0340     Chief Complaint  Patient presents with  . Cough   HPI Scott Jones is a 67 y.o. male presenting with 3 days history of cough, chest congestion, runny nose, his cough is productive of some yellow sputum occasionally, no hemoptysis, no history of venous thromboembolic disease, no shortness of breath he denies fevers, chills he does have some tightness in his chest when he coughs, is does not radiate, it only occurs when he coughs. Is not taking any medicine for this. Patient's concerned for pneumonia, he thinks he may need antibiotics.   Past Medical History  Diagnosis Date  . Hypertension   . Diabetes mellitus   . Hepatitis C   . Asthma   . Depression   . Chronic kidney disease   . Allergy    Past Surgical History  Procedure Laterality Date  . Nephrectomy      partial - benign tumor  . Partial nephrectomy  10/08/2011    Left kidney   Family History  Problem Relation Age of Onset  . Diabetes Mother   . Hypertension Mother   . COPD Mother   . Asthma Mother   . Diabetes Father   . Diabetes Sister   . Diabetes Brother    History  Substance Use Topics  . Smoking status: Former Smoker -- 1.00 packs/day for 11 years    Types: Cigarettes, Pipe    Quit date: 08/07/1974  . Smokeless tobacco: Never Used  . Alcohol Use: 1.8 oz/week    3 Cans of beer per week    Review of Systems At least 10pt or greater review of systems completed and are negative except where specified in the HPI.  Allergies  Review of patient's allergies indicates no known allergies.  Home Medications   Current Outpatient Rx  Name  Route  Sig  Dispense  Refill  . aspirin 81 MG tablet   Oral   Take 81 mg by mouth daily.         . cholecalciferol (VITAMIN D) 1000 UNITS tablet   Oral   Take 1,000 Units by mouth daily.         Marland Kitchen glipiZIDE (GLUCOTROL) 5 MG tablet  Oral   Take 5 mg by mouth 2 (two) times daily before a meal.         . hydrochlorothiazide (HYDRODIURIL) 25 MG tablet   Oral   Take 1 tablet (25 mg total) by mouth daily.   30 tablet   0     Dispense as written.    Patient needs follow up for refills   . lisinopril (PRINIVIL,ZESTRIL) 40 MG tablet   Oral   Take 1 tablet (40 mg total) by mouth daily.   30 tablet   0   . NIFEdipine (ADALAT CC) 90 MG 24 hr tablet   Oral   Take 1 tablet (90 mg total) by mouth daily.   30 tablet   1   . saxagliptin HCl (ONGLYZA) 2.5 MG TABS tablet   Oral   Take 2.5 mg by mouth daily.         . sertraline (ZOLOFT) 100 MG tablet   Oral   Take 100 mg by mouth daily.         . simvastatin (ZOCOR) 40 MG tablet   Oral   Take 20 mg  by mouth every evening.         . tadalafil (CIALIS) 20 MG tablet   Oral   Take 1 tablet (20 mg total) by mouth every other day as needed for erectile dysfunction.   10 tablet   5   . vitamin C (ASCORBIC ACID) 500 MG tablet   Oral   Take 500 mg by mouth daily.         . benzonatate (TESSALON) 200 MG capsule   Oral   Take 1 capsule (200 mg total) by mouth 3 (three) times daily as needed for cough.   20 capsule   0   . guaiFENesin (MUCINEX) 600 MG 12 hr tablet   Oral   Take 2 tablets (1,200 mg total) by mouth once.   30 tablet   0    BP 153/90  Pulse 50  Temp(Src) 99.1 F (37.3 C) (Oral)  Resp 18  Ht $R'5\' 6"'aT$  (1.676 m)  Wt 184 lb (83.462 kg)  BMI 29.71 kg/m2  SpO2 94% Physical Exam  Nursing notes reviewed.  Electronic medical record reviewed. VITAL SIGNS:   Filed Vitals:   01/02/13 0400 01/02/13 0415 01/02/13 0430 01/02/13 0445  BP: 147/81 124/75 141/85 153/90  Pulse: 69 61 61 50  Temp:      TempSrc:      Resp:      Height:      Weight:      SpO2: 96% 97% 94% 94%   CONSTITUTIONAL: Awake, oriented, appears non-toxic HENT: Atraumatic, normocephalic, oral mucosa pink and moist, airway patent. Nares patent without drainage. External  ears normal. EYES: Conjunctiva clear, EOMI, PERRLA NECK: Trachea midline, non-tender, supple CARDIOVASCULAR: Normal heart rate, Normal rhythm, No murmurs, rubs, gallops PULMONARY/CHEST: Clear to auscultation, no rhonchi, wheezes, or rales. Symmetrical breath sounds. Non-tender. ABDOMINAL: Non-distended, soft, non-tender - no rebound or guarding.  BS normal. NEUROLOGIC: Non-focal, moving all four extremities, no gross sensory or motor deficits. EXTREMITIES: No clubbing, cyanosis, or edema SKIN: Warm, Dry, No erythema, No rash  ED Course   Procedures (including critical care time)  Labs Reviewed - No data to display Dg Chest 2 View  01/02/2013   *RADIOLOGY REPORT*  Clinical Data: Chest congestion.  CHEST - 2 VIEW  Comparison: No priors.  Findings: Lung volumes are normal.  No consolidative airspace disease.  No pleural effusions.  No pneumothorax.  No pulmonary nodule or mass noted.  Pulmonary vasculature and the cardiomediastinal silhouette are within normal limits. A coil is seen projecting over the left upper quadrant of the abdomen.  IMPRESSION: 1. No radiographic evidence of acute cardiopulmonary disease.   Original Report Authenticated By: Vinnie Langton, M.D.   1. Bronchitis     MDM  Scott Jones is a 67 y.o. male  presenting with cough, runny nose, symptoms consistent with a viral process. Chest x-ray is unremarkable. Patient is afebrile, nontoxic, patient's symptoms, does not need antibiotics at this time, and she's not wheezing I do not think albuterol either. He is a remote smoking history-do not think his COPD exacerbation, likely URI or bronchitis. Discussed the patient, he understands and agrees with medical plan and I have answered his questions to his satisfaction   Rhunette Croft, MD 01/02/13 [phone]

## 2013-05-17 ENCOUNTER — Ambulatory Visit (INDEPENDENT_AMBULATORY_CARE_PROVIDER_SITE_OTHER): Payer: Medicare Other | Admitting: Family Medicine

## 2013-05-17 VITALS — BP 157/78 | HR 66 | Temp 98.9°F | Resp 16 | Ht 65.0 in | Wt 182.0 lb

## 2013-05-17 DIAGNOSIS — I10 Essential (primary) hypertension: Secondary | ICD-10-CM

## 2013-05-17 DIAGNOSIS — E785 Hyperlipidemia, unspecified: Secondary | ICD-10-CM

## 2013-05-17 DIAGNOSIS — F32A Depression, unspecified: Secondary | ICD-10-CM

## 2013-05-17 DIAGNOSIS — F329 Major depressive disorder, single episode, unspecified: Secondary | ICD-10-CM

## 2013-05-17 DIAGNOSIS — N189 Chronic kidney disease, unspecified: Secondary | ICD-10-CM

## 2013-05-17 DIAGNOSIS — F3289 Other specified depressive episodes: Secondary | ICD-10-CM

## 2013-05-17 DIAGNOSIS — IMO0001 Reserved for inherently not codable concepts without codable children: Secondary | ICD-10-CM

## 2013-05-17 LAB — POCT GLYCOSYLATED HEMOGLOBIN (HGB A1C): Hemoglobin A1C: 7.1

## 2013-05-17 MED ORDER — SAXAGLIPTIN HCL 2.5 MG PO TABS: 2.5000 mg | ORAL_TABLET | Freq: Every day | ORAL | Status: DC

## 2013-05-17 MED ORDER — HYDROCHLOROTHIAZIDE 25 MG PO TABS: 25.0000 mg | ORAL_TABLET | Freq: Every day | ORAL | Status: DC

## 2013-05-17 MED ORDER — GLIPIZIDE 5 MG PO TABS: 5.0000 mg | ORAL_TABLET | Freq: Two times a day (BID) | ORAL | Status: DC

## 2013-05-17 MED ORDER — NIFEDIPINE ER 90 MG PO TB24: 90.0000 mg | ORAL_TABLET | Freq: Every day | ORAL | Status: DC

## 2013-05-17 MED ORDER — LISINOPRIL 40 MG PO TABS: 40.0000 mg | ORAL_TABLET | Freq: Every day | ORAL | Status: DC

## 2013-05-17 MED ORDER — SIMVASTATIN 40 MG PO TABS: 20.0000 mg | ORAL_TABLET | Freq: Every evening | ORAL | Status: DC

## 2013-05-17 MED ORDER — SERTRALINE HCL 100 MG PO TABS: 100.0000 mg | ORAL_TABLET | Freq: Every day | ORAL | Status: DC
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
## 2013-05-18 LAB — COMPREHENSIVE METABOLIC PANEL WITH GFR
ALT: 17 U/L (ref 0–53)
Albumin: 4.3 g/dL (ref 3.5–5.2)
BUN: 36 mg/dL — ABNORMAL HIGH (ref 6–23)
CO2: 29 meq/L (ref 19–32)
Calcium: 9.4 mg/dL (ref 8.4–10.5)
Chloride: 105 meq/L (ref 96–112)
Creat: 2.11 mg/dL — ABNORMAL HIGH (ref 0.50–1.35)
Potassium: 3.9 meq/L (ref 3.5–5.3)

## 2013-05-18 LAB — MICROALBUMIN, URINE: Microalb, Ur: 34.57 mg/dL — ABNORMAL HIGH (ref 0.00–1.89)

## 2013-05-18 LAB — COMPREHENSIVE METABOLIC PANEL
AST: 20 U/L (ref 0–37)
Alkaline Phosphatase: 56 U/L (ref 39–117)
Glucose, Bld: 155 mg/dL — ABNORMAL HIGH (ref 70–99)
Sodium: 144 mEq/L (ref 135–145)
Total Bilirubin: 0.4 mg/dL (ref 0.3–1.2)
Total Protein: 7.3 g/dL (ref 6.0–8.3)

## 2013-05-18 LAB — LDL CHOLESTEROL, DIRECT: Direct LDL: 82 mg/dL

## 2013-05-25 ENCOUNTER — Encounter: Payer: Self-pay | Admitting: Family Medicine

## 2013-09-17 LAB — BASIC METABOLIC PANEL
CREATININE: 2.2 mg/dL — AB (ref 0.6–1.3)
Glucose: 247 mg/dL
POTASSIUM: 3.6 mmol/L (ref 3.4–5.3)
Sodium: 138 mmol/L (ref 137–147)

## 2013-09-17 LAB — CBC AND DIFFERENTIAL
HEMATOCRIT: 36 % — AB (ref 41–53)
Hemoglobin: 12.9 g/dL — AB (ref 13.5–17.5)
PLATELETS: 194 10*3/uL (ref 150–399)
WBC: 7.4 10*3/mL

## 2013-09-22 ENCOUNTER — Other Ambulatory Visit: Payer: Self-pay | Admitting: Family Medicine

## 2013-09-26 ENCOUNTER — Telehealth: Payer: Self-pay

## 2013-09-26 DIAGNOSIS — I10 Essential (primary) hypertension: Secondary | ICD-10-CM

## 2013-09-26 MED ORDER — HYDROCHLOROTHIAZIDE 25 MG PO TABS: 25.0000 mg | ORAL_TABLET | Freq: Every day | ORAL | Status: DC

## 2013-09-26 MED ORDER — LISINOPRIL 40 MG PO TABS: 40.0000 mg | ORAL_TABLET | Freq: Every day | ORAL | Status: DC

## 2013-09-26 NOTE — Telephone Encounter (Signed)
Pt is upset that we would not refill his bp meds (HCTZ/Lisinopril), he states [facility] contacted [place] but we refused to refill. He is still having trouble with the [facility] delivering his meds to his new address and is out. He was not happy that we would not refill and would like to speak to the provider that denied his refills to get a better understanding.  [phone]  pt

## 2013-09-26 NOTE — Telephone Encounter (Signed)
Spoke with pt. After his visit with [place] in Dec (see OV) he was living in a rental property and has finally been able to get a new house to live in but there has been some problems with the address change and the [place]. He wanted to know if we could send in 2 weeks worth of the HCTZ and Lisinopril. Sent for him

## 2013-10-05 ENCOUNTER — Ambulatory Visit (INDEPENDENT_AMBULATORY_CARE_PROVIDER_SITE_OTHER): Payer: Medicare Other | Admitting: Family Medicine

## 2013-10-05 ENCOUNTER — Ambulatory Visit: Payer: Medicare Other

## 2013-10-05 VITALS — BP 148/66 | HR 64 | Temp 98.2°F | Resp 16 | Ht 64.5 in | Wt 185.0 lb

## 2013-10-05 DIAGNOSIS — M109 Gout, unspecified: Secondary | ICD-10-CM

## 2013-10-05 DIAGNOSIS — M79609 Pain in unspecified limb: Secondary | ICD-10-CM

## 2013-10-05 DIAGNOSIS — M79674 Pain in right toe(s): Secondary | ICD-10-CM

## 2013-10-05 LAB — COMPREHENSIVE METABOLIC PANEL
ALT: 15 U/L (ref 0–53)
AST: 20 U/L (ref 0–37)
Albumin: 4.9 g/dL (ref 3.5–5.2)
Alkaline Phosphatase: 65 U/L (ref 39–117)
BUN: 28 mg/dL — ABNORMAL HIGH (ref 6–23)
CO2: 28 mEq/L (ref 19–32)
Calcium: 10 mg/dL (ref 8.4–10.5)
Chloride: 100 mEq/L (ref 96–112)
Creat: 2.11 mg/dL — ABNORMAL HIGH (ref 0.50–1.35)
Glucose, Bld: 207 mg/dL — ABNORMAL HIGH (ref 70–99)
Potassium: 3.8 mEq/L (ref 3.5–5.3)
Sodium: 141 mEq/L (ref 135–145)
Total Bilirubin: 0.6 mg/dL (ref 0.2–1.2)
Total Protein: 8 g/dL (ref 6.0–8.3)

## 2013-10-05 LAB — URIC ACID: Uric Acid, Serum: 7.5 mg/dL (ref 4.0–7.8)

## 2013-10-05 MED ORDER — INDOMETHACIN 50 MG PO CAPS: 50.0000 mg | ORAL_CAPSULE | Freq: Three times a day (TID) | ORAL | Status: DC

## 2013-10-05 NOTE — Patient Instructions (Signed)
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
## 2013-10-10 ENCOUNTER — Telehealth: Payer: Self-pay

## 2013-10-10 NOTE — Telephone Encounter (Signed)
PA approved for indomethacin 50 mg through 10/10/14.

## 2014-01-02 ENCOUNTER — Ambulatory Visit (INDEPENDENT_AMBULATORY_CARE_PROVIDER_SITE_OTHER): Payer: Medicare Other | Admitting: Family Medicine

## 2014-01-02 VITALS — BP 140/78 | HR 72 | Temp 98.9°F | Resp 16 | Ht 66.5 in | Wt 178.2 lb

## 2014-01-02 DIAGNOSIS — K644 Residual hemorrhoidal skin tags: Secondary | ICD-10-CM

## 2014-01-02 MED ORDER — HYDROCORTISONE ACETATE 25 MG RE SUPP: 25.0000 mg | Freq: Two times a day (BID) | RECTAL | Status: DC

## 2014-01-02 NOTE — Progress Notes (Signed)
  Scott Jones - 68 y.o. male MRN [id number]  Date of birth: 1946/05/16  SUBJECTIVE:  Including CC & ROS.  patient C/O: hemorrhoid Onset of symptoms: last night  Symptoms: pain for straining and constipation for past 2-3 days, pain started with feels a large marble side hemorrhoid.  Relieving factors: Preporation H and warm compression with helped some  Worsened by: sitting, no BM today, last BM last night no straining   ROS:  Constitutional:  No fever, chills, or fatigue.  Review of systems otherwise negative except for what is stated in HPI  HISTORY: Past Medical, Surgical, Social, and Family History Reviewed & Updated per EMR. Pertinent Historical Findings include: Several years ago had hemorrhoids,   PHYSICAL EXAM:  VS: BP:140/78 mmHg  HR:72bpm  TEMP:98.9 F (37.2 C)(Oral)  RESP:98 %  HT:5' 6.5" (168.9 cm)   WT:178 lb 3.2 oz (80.831 kg)  BMI:28.4 PHYSICAL EXAM: General:  Alert and oriented, No acute distress.   Integumentary:  Warm, Dry, No rash.   Rectal exam: patient has a 3cmx3cm large thrombosed hemorrhoid, TTP swelling, no significant surrounding erythema, normal rectal tone  Procedure note:  Thrombosed Hemorrhoid excision and clot drainage procedure: Consent obtained and verified. Sterile betadine prep.  cleansed with betadine swab x3. Topical analgesic spray: Ethyl chloride. Injection Indication: thrombosed hemorrhoid Approached in typical fashion with: Completed without difficulty Meds: 2 cc of 1% lidocaine Needle: 27 g 1 inch Follow analgesic with lidocaine patient's hemorrhoid was excised with a 10 blade scalple. Manual express was used to express clot and a hemostat was used to pull out clot pieces. Once a fairly large clot was removed the hemorrhoid skin shrunk back to normal size and 2 4x4 were used to apply direct pressure.  Aftercare instructions and Red flags advised. Advised to call if fevers/chills, erythema, induration, drainage, or persistent  bleeding.   ASSESSMENT & PLAN:  Large thrombosed external hemorrhoid: - Discussed with patient option of conservative Tx with sitz bath, warm compresses or excision. Patient desired excision after counseling about risk and benefits. Procedure note above. Patient tolerated procedure well. Recommend sitz bath for next 3 days to draw out remaining blood. Patient also given hydrocortisone suppository to use PRN for pain control starting in 2-3 days after initial healing.

## 2014-01-02 NOTE — Patient Instructions (Signed)

## 2014-01-04 NOTE — Progress Notes (Signed)
Patient discussed with and present for procedure/assisted Dr. Ollen Barges.. Agree with assessment and plan of care per her note below. Central small incision into hemorrhoid after lidocaine centrally. No complications.

## 2014-01-05 NOTE — Addendum Note (Signed)
Addended by: Wardell Honour on: 01/05/2014 05:34 PM   Modules accepted: Level of Service

## 2014-01-06 ENCOUNTER — Emergency Department (HOSPITAL_COMMUNITY)
Admission: EM | Admit: 2014-01-06 | Discharge: 2014-01-07 | Discharge status: Against Medical Advice | Payer: Medicare Other | Attending: Emergency Medicine | Admitting: Emergency Medicine

## 2014-01-06 ENCOUNTER — Encounter (HOSPITAL_COMMUNITY): Payer: Self-pay | Admitting: Emergency Medicine

## 2014-01-06 DIAGNOSIS — K649 Unspecified hemorrhoids: Secondary | ICD-10-CM | POA: Insufficient documentation

## 2014-01-06 DIAGNOSIS — E119 Type 2 diabetes mellitus without complications: Secondary | ICD-10-CM | POA: Insufficient documentation

## 2014-01-06 DIAGNOSIS — J45909 Unspecified asthma, uncomplicated: Secondary | ICD-10-CM | POA: Insufficient documentation

## 2014-01-06 DIAGNOSIS — I129 Hypertensive chronic kidney disease with stage 1 through stage 4 chronic kidney disease, or unspecified chronic kidney disease: Secondary | ICD-10-CM | POA: Diagnosis not present

## 2014-01-06 DIAGNOSIS — N189 Chronic kidney disease, unspecified: Secondary | ICD-10-CM | POA: Insufficient documentation

## 2014-01-06 NOTE — ED Notes (Addendum)
Presents with hemorrhoids, pt is concerned because he recently had a hemorhoidectomy and is worried another one need to be drained or taken out.  Home treatments are not working. Cm,plains of pain with standing and walking, sitting on inflatable donut helps. No bleeding at this time.

## 2014-01-29 LAB — CBC AND DIFFERENTIAL
HEMATOCRIT: 36 % — AB (ref 41–53)
HEMOGLOBIN: 12.8 g/dL — AB (ref 13.5–17.5)
PLATELETS: 266 10*3/uL (ref 150–399)
WBC: 7.9 10^3/mL

## 2014-01-29 LAB — BASIC METABOLIC PANEL: Creatinine: 2.1 mg/dL — AB (ref 0.6–1.3)

## 2014-01-29 LAB — HEMOGLOBIN A1C: Hemoglobin A1C: 7.7

## 2014-02-12 ENCOUNTER — Telehealth: Payer: Self-pay

## 2014-02-12 NOTE — Telephone Encounter (Signed)
Patient says he missed a call from here, but there was no message left.

## 2014-02-12 NOTE — Telephone Encounter (Signed)
Unable to find where anyone called pt. Let him know this and apologized.

## 2014-08-08 ENCOUNTER — Ambulatory Visit (INDEPENDENT_AMBULATORY_CARE_PROVIDER_SITE_OTHER): Payer: Medicare Other

## 2014-08-08 ENCOUNTER — Ambulatory Visit (INDEPENDENT_AMBULATORY_CARE_PROVIDER_SITE_OTHER): Payer: Medicare Other | Admitting: Family Medicine

## 2014-08-08 VITALS — BP 116/66 | HR 73 | Temp 98.5°F | Resp 17 | Ht 65.5 in | Wt 126.0 lb

## 2014-08-08 DIAGNOSIS — J988 Other specified respiratory disorders: Secondary | ICD-10-CM

## 2014-08-08 DIAGNOSIS — J22 Unspecified acute lower respiratory infection: Secondary | ICD-10-CM

## 2014-08-08 DIAGNOSIS — N183 Chronic kidney disease, stage 3 unspecified: Secondary | ICD-10-CM

## 2014-08-08 DIAGNOSIS — R059 Cough, unspecified: Secondary | ICD-10-CM

## 2014-08-08 DIAGNOSIS — R05 Cough: Secondary | ICD-10-CM

## 2014-08-08 DIAGNOSIS — R062 Wheezing: Secondary | ICD-10-CM

## 2014-08-08 MED ORDER — AZITHROMYCIN 250 MG PO TABS
ORAL_TABLET | ORAL | Status: DC
Start: 2014-08-08 — End: 2014-12-16

## 2014-08-08 MED ORDER — HYDROCODONE-HOMATROPINE 5-1.5 MG/5ML PO SYRP: 5.0000 mL | ORAL_SOLUTION | Freq: Three times a day (TID) | ORAL | Status: DC | PRN

## 2014-08-08 MED ORDER — ALBUTEROL SULFATE HFA 108 (90 BASE) MCG/ACT IN AERS: 2.0000 | INHALATION_SPRAY | Freq: Four times a day (QID) | RESPIRATORY_TRACT | Status: DC | PRN

## 2014-08-08 MED ORDER — BENZONATATE 100 MG PO CAPS: 200.0000 mg | ORAL_CAPSULE | Freq: Two times a day (BID) | ORAL | Status: DC | PRN

## 2014-08-08 NOTE — Patient Instructions (Signed)
Acute Bronchitis °Bronchitis is inflammation of the airways that extend from the windpipe into the lungs (bronchi). The inflammation often causes mucus to develop. This leads to a cough, which is the most common symptom of bronchitis.  °In acute bronchitis, the condition usually develops suddenly and goes away over time, usually in a couple weeks. Smoking, allergies, and asthma can make bronchitis worse. Repeated episodes of bronchitis may cause further lung problems.  °CAUSES °Acute bronchitis is most often caused by the same virus that causes a cold. The virus can spread from person to person (contagious) through coughing, sneezing, and touching contaminated objects. °SIGNS AND SYMPTOMS  °· Cough.   °· Fever.   °· Coughing up mucus.   °· Body aches.   °· Chest congestion.   °· Chills.   °· Shortness of breath.   °· Sore throat.   °DIAGNOSIS  °Acute bronchitis is usually diagnosed through a physical exam. Your health care provider will also ask you questions about your medical history. Tests, such as chest X-rays, are sometimes done to rule out other conditions.  °TREATMENT  °Acute bronchitis usually goes away in a couple weeks. Oftentimes, no medical treatment is necessary. Medicines are sometimes given for relief of fever or cough. Antibiotic medicines are usually not needed but may be prescribed in certain situations. In some cases, an inhaler may be recommended to help reduce shortness of breath and control the cough. A cool mist vaporizer may also be used to help thin bronchial secretions and make it easier to clear the chest.  °HOME CARE INSTRUCTIONS °· Get plenty of rest.   °· Drink enough fluids to keep your urine clear or pale yellow (unless you have a medical condition that requires fluid restriction). Increasing fluids may help thin your respiratory secretions (sputum) and reduce chest congestion, and it will prevent dehydration.   °· Take medicines only as directed by your health care provider. °· If  you were prescribed an antibiotic medicine, finish it all even if you start to feel better. °· Avoid smoking and secondhand smoke. Exposure to cigarette smoke or irritating chemicals will make bronchitis worse. If you are a smoker, consider using nicotine gum or skin patches to help control withdrawal symptoms. Quitting smoking will help your lungs heal faster.   °· Reduce the chances of another bout of acute bronchitis by washing your hands frequently, avoiding people with cold symptoms, and trying not to touch your hands to your mouth, nose, or eyes.   °· Keep all follow-up visits as directed by your health care provider.   °SEEK MEDICAL CARE IF: °Your symptoms do not improve after 1 week of treatment.  °SEEK IMMEDIATE MEDICAL CARE IF: °· You develop an increased fever or chills.   °· You have chest pain.   °· You have severe shortness of breath. °· You have bloody sputum.   °· You develop dehydration. °· You faint or repeatedly feel like you are going to pass out. °· You develop repeated vomiting. °· You develop a severe headache. °MAKE SURE YOU:  °· Understand these instructions. °· Will watch your condition. °· Will get help right away if you are not doing well or get worse. °Document Released: 06/17/2004 Document Revised: 09/24/2013 Document Reviewed: 10/31/2012 °ExitCare® Patient Information ©2015 [employer]. This information is not intended to replace advice given to you by your health care provider. Make sure you discuss any questions you have with your health care provider. ° °

## 2014-08-08 NOTE — Progress Notes (Signed)
Chief Complaint:  Chief Complaint  Patient presents with  . Cough    x 1 week  . Fever    x 1 day    HPI: Scott Jones is a 69 y.o. male who is here for  2 day history of feeling like URI sxs, progressively worse , has had cough x 1 week, productive green sputum, and also fever last night. He did not measure his temperature. He was in [place] 2 weeks ago. Denies shortness of breath. He does have some wheezing with cough. The cough has been productive. Dark yellow, he has tried his wife's inhaler, she was sick. He has not tried any cough medicine. He was in [place] for a business trip when this occurred.  Creatinine is 2.11 , and had MRI at [facility] about 1 month ago and his kidney function was stable.   Past Medical History  Diagnosis Date  . Hypertension   . Diabetes mellitus   . Hepatitis C   . Asthma   . Depression   . Chronic kidney disease   . Allergy    Past Surgical History  Procedure Laterality Date  . Nephrectomy      partial - benign tumor  . Partial nephrectomy  10/08/2011    Left kidney   History   Social History  . Marital Status: Married    Spouse Name: N/A  . Number of Children: N/A  . Years of Education: N/A   Social History Main Topics  . Smoking status: Former Smoker -- 1.00 packs/day for 11 years    Types: Cigarettes, Pipe    Quit date: 08/07/1974  . Smokeless tobacco: Never Used  . Alcohol Use: 1.8 oz/week    3 Cans of beer per week  . Drug Use: No  . Sexual Activity: Yes    Birth Control/ Protection: None   Other Topics Concern  . None   Social History Narrative   Family History  Problem Relation Age of Onset  . Diabetes Mother   . Hypertension Mother   . COPD Mother   . Asthma Mother   . Diabetes Father   . Diabetes Sister   . Diabetes Brother    No Known Allergies Prior to Admission medications   Medication Sig Start Date End Date Taking? Authorizing Provider  aspirin 81 MG tablet Take 81 mg by mouth daily.   Yes  Historical Provider, MD  cholecalciferol (VITAMIN D) 1000 UNITS tablet Take 1,000 Units by mouth daily.   Yes Historical Provider, MD  glipiZIDE (GLUCOTROL) 5 MG tablet Take 1 tablet (5 mg total) by mouth 2 (two) times daily before a meal. 05/17/13  Yes Carianna Lague P Othella Slappey, DO  hydrochlorothiazide (HYDRODIURIL) 25 MG tablet Take 1 tablet (25 mg total) by mouth daily. 09/26/13  Yes Raife Lizer P Shona Pardo, DO  lisinopril (PRINIVIL,ZESTRIL) 40 MG tablet Take 1 tablet (40 mg total) by mouth daily. 09/26/13  Yes Lillian Tigges P Marki Frede, DO  NIFEdipine (ADALAT CC) 90 MG 24 hr tablet Take 1 tablet (90 mg total) by mouth daily. 05/17/13  Yes Shellia Hartl P Analysia Dungee, DO  saxagliptin HCl (ONGLYZA) 2.5 MG TABS tablet Take 1 tablet (2.5 mg total) by mouth daily. 05/17/13  Yes Matalynn Graff P Roslynn Holte, DO  sertraline (ZOLOFT) 100 MG tablet Take 1 tablet (100 mg total) by mouth daily. 05/17/13  Yes Shanvi Moyd P Shuntel Fishburn, DO  simvastatin (ZOCOR) 40 MG tablet Take 0.5 tablets (20 mg total) by mouth every evening. 05/17/13  Yes Sammi Stolarz  P Tylique Aull, DO  tadalafil (CIALIS) 20 MG tablet Take 1 tablet (20 mg total) by mouth every other day as needed for erectile dysfunction. 08/07/12  Yes Alveda Reasons, MD  vitamin C (ASCORBIC ACID) 500 MG tablet Take 500 mg by mouth daily.   Yes Historical Provider, MD  hydrocortisone (ANUSOL-HC) 25 MG suppository Place 1 suppository (25 mg total) rectally 2 (two) times daily. Patient not taking: Reported on 08/08/2014 01/02/14   Deanna M Didiano, DO  indomethacin (INDOCIN) 50 MG capsule Take 1 capsule (50 mg total) by mouth 3 (three) times daily with meals. Patient not taking: Reported on 08/08/2014 10/05/13   Robyn Haber, MD     ROS: The patient denies night sweats, unintentional weight loss, chest pain, palpitations,  dyspnea on exertion, nausea, vomiting, abdominal pain, dysuria, hematuria, melena, numbness, weakness, or tingling.   All other systems have been reviewed and were otherwise negative with the exception of those mentioned in the HPI and as above.     PHYSICAL EXAM: Filed Vitals:   08/08/14 1435  BP: 116/66  Pulse: 73  Temp: 98.5 F (36.9 C)  Resp: 17   SpO2 Readings from Last 3 Encounters:  08/08/14 95%  01/06/14 100%  01/02/14 98%    Filed Vitals:   08/08/14 1435  Height: 5' 5.5" (1.664 m)  Weight: 126 lb (57.153 kg)   Body mass index is 20.64 kg/(m^2).  General: Alert, no acute distress HEENT:  Normocephalic, atraumatic, oropharynx patent. EOMI, PERRLA Erythematous throat, no exudates, TM normal, +/- sinus tenderness, + erythematous/boggy nasal mucosa Cardiovascular:  Regular rate and rhythm, no rubs murmurs or gallops.  No Carotid bruits, radial pulse intact. No pedal edema.  Respiratory: Clear to auscultation bilaterally.  No wheezes, rales, or rhonchi.  No cyanosis, no use of accessory musculature GI: No organomegaly, abdomen is soft and non-tender, positive bowel sounds.  No masses. Skin: No rashes. Neurologic: Facial musculature symmetric. Psychiatric: Patient is appropriate throughout our interaction. Lymphatic: No cervical lymphadenopathy Musculoskeletal: Gait intact.   LABS: Results for orders placed or performed in visit on 10/05/13  Uric Acid  Result Value Ref Range   Uric Acid, Serum 7.5 4.0 - 7.8 mg/dL  Comprehensive metabolic panel  Result Value Ref Range   Sodium 141 135 - 145 mEq/L   Potassium 3.8 3.5 - 5.3 mEq/L   Chloride 100 96 - 112 mEq/L   CO2 28 19 - 32 mEq/L   Glucose, Bld 207 (H) 70 - 99 mg/dL   BUN 28 (H) 6 - 23 mg/dL   Creat 2.11 (H) 0.50 - 1.35 mg/dL   Total Bilirubin 0.6 0.2 - 1.2 mg/dL   Alkaline Phosphatase 65 39 - 117 U/L   AST 20 0 - 37 U/L   ALT 15 0 - 53 U/L   Total Protein 8.0 6.0 - 8.3 g/dL   Albumin 4.9 3.5 - 5.2 g/dL   Calcium 10.0 8.4 - 10.5 mg/dL     EKG/XRAY:   Primary read interpreted by Dr. Marin Comment at [facility]. Positive for questionable lower right infiltrate vs bronchitis   ASSESSMENT/PLAN: Encounter Diagnoses  Name Primary?  . Cough Yes  . Lower  respiratory infection (e.g., bronchitis, pneumonia, pneumonitis, pulmonitis)   . CKD (chronic kidney disease) stage 3, GFR 30-59 ml/min   . Wheezing    Prescribed azithromycin, Tessalon Perles, Hycodan, albuterol inhaler. Follow-up When necessary.  Gross sideeffects, risk and benefits, and alternatives of medications d/w patient. Patient is aware that all medications have potential sideeffects  and we are unable to predict every sideeffect or drug-drug interaction that may occur.  Kassity Woodson, [place], DO 08/08/2014 5:07 PM

## 2014-10-29 LAB — HEMOGLOBIN A1C: Hemoglobin A1C: 6.5

## 2014-12-16 ENCOUNTER — Ambulatory Visit (INDEPENDENT_AMBULATORY_CARE_PROVIDER_SITE_OTHER): Payer: Medicare Other | Admitting: Family Medicine

## 2014-12-16 ENCOUNTER — Ambulatory Visit (INDEPENDENT_AMBULATORY_CARE_PROVIDER_SITE_OTHER): Payer: Medicare Other

## 2014-12-16 VITALS — BP 132/72 | HR 71 | Temp 98.7°F | Resp 18 | Ht 67.0 in | Wt 184.0 lb

## 2014-12-16 DIAGNOSIS — S8001XA Contusion of right knee, initial encounter: Secondary | ICD-10-CM

## 2014-12-16 DIAGNOSIS — R1013 Epigastric pain: Secondary | ICD-10-CM | POA: Diagnosis not present

## 2014-12-16 DIAGNOSIS — E739 Lactose intolerance, unspecified: Secondary | ICD-10-CM

## 2014-12-16 DIAGNOSIS — M25561 Pain in right knee: Secondary | ICD-10-CM

## 2014-12-16 NOTE — Progress Notes (Signed)
  Subjective:  Patient ID: Scott Jones, male    DOB: [birth year]  Age: 69 y.o. 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 He hit the bottom step which had a metal rim. He got a little laceration on his left shin which is healing up, but he has been continuing to hurt a lot at the right tibial tuberosity area.  He was fine prior to this accident.   Objective:   No acute distress. Right tibial tuberosity is prominent. Is very tender. He is not tender on the tendon itself just above there. Minimal bruise. Left leg has a little tenderness and edema around the healing scabbed laceration.  Assessment & Plan:   Assessment: Contusion left and right legs, right leg pain  Plan:  X-ray right knee [facility] reading (PRIMARY) by  Dr. Linna Darner Normal knee  There are no Patient Instructions on file for this visit.   Kepler Mccabe, MD 12/16/2014

## 2014-12-16 NOTE — Patient Instructions (Signed)
Tylenol for pain  Give the knee time.  Return if not improving.  Avoid antiinflammatory meds  Try Lactaid when eating dairy products.

## 2015-04-29 LAB — CBC AND DIFFERENTIAL
HCT: 35 % — AB (ref 41–53)
Hemoglobin: 12.4 g/dL — AB (ref 13.5–17.5)
Platelets: 211 10*3/uL (ref 150–399)
WBC: 9.1 10^3/mL

## 2015-08-07 LAB — HEMOGLOBIN A1C
HEMOGLOBIN A1C: 8.2
Hemoglobin A1C: 8.2

## 2015-08-07 LAB — BASIC METABOLIC PANEL
Creatinine: 2.7 mg/dL — AB (ref 0.6–1.3)
GLUCOSE: 199 mg/dL
Potassium: 4 mmol/L (ref 3.4–5.3)
SODIUM: 143 mmol/L (ref 137–147)

## 2015-08-07 LAB — HEPATIC FUNCTION PANEL
ALT: 22 U/L (ref 10–40)
AST: 23 U/L (ref 14–40)
Alkaline Phosphatase: 63 U/L (ref 25–125)
Bilirubin, Total: 0.5 mg/dL

## 2015-08-07 LAB — CBC AND DIFFERENTIAL
HEMATOCRIT: 34 % — AB (ref 41–53)
HEMOGLOBIN: 12.2 g/dL — AB (ref 13.5–17.5)
Platelets: 232 10*3/uL (ref 150–399)
WBC: 9.7 10*3/mL

## 2015-08-07 LAB — TSH: TSH: 1.91 u[IU]/mL (ref <=5.90)

## 2015-08-07 LAB — LIPID PANEL
CHOLESTEROL: 171 mg/dL (ref 0–200)
HDL: 42 mg/dL (ref 35–70)
LDL CALC: 92 mg/dL
TRIGLYCERIDES: 186 mg/dL — AB (ref 40–160)

## 2015-12-24 ENCOUNTER — Telehealth: Payer: Self-pay | Admitting: *Deleted

## 2015-12-24 NOTE — Telephone Encounter (Signed)
Unable to reach patient at time of Pre-Visit Call. Line was picked up, but not answered. Call ended.

## 2015-12-25 ENCOUNTER — Ambulatory Visit (HOSPITAL_BASED_OUTPATIENT_CLINIC_OR_DEPARTMENT_OTHER)
Admission: RE | Admit: 2015-12-25 | Discharge: 2015-12-25 | Disposition: A | Discharge status: Home / Self Care | Payer: PPO | Source: Ambulatory Visit | Attending: Family Medicine | Admitting: Family Medicine

## 2015-12-25 ENCOUNTER — Encounter: Payer: Self-pay | Admitting: Family Medicine

## 2015-12-25 ENCOUNTER — Ambulatory Visit (INDEPENDENT_AMBULATORY_CARE_PROVIDER_SITE_OTHER): Payer: PPO | Admitting: Family Medicine

## 2015-12-25 VITALS — BP 138/78 | HR 64 | Temp 97.9°F | Ht 66.0 in | Wt 187.2 lb

## 2015-12-25 DIAGNOSIS — I10 Essential (primary) hypertension: Secondary | ICD-10-CM

## 2015-12-25 DIAGNOSIS — E1122 Type 2 diabetes mellitus with diabetic chronic kidney disease: Secondary | ICD-10-CM

## 2015-12-25 DIAGNOSIS — N184 Chronic kidney disease, stage 4 (severe): Secondary | ICD-10-CM | POA: Diagnosis not present

## 2015-12-25 DIAGNOSIS — R0781 Pleurodynia: Secondary | ICD-10-CM | POA: Diagnosis not present

## 2015-12-25 DIAGNOSIS — E785 Hyperlipidemia, unspecified: Secondary | ICD-10-CM

## 2015-12-25 DIAGNOSIS — F4323 Adjustment disorder with mixed anxiety and depressed mood: Secondary | ICD-10-CM

## 2015-12-25 DIAGNOSIS — R079 Chest pain, unspecified: Secondary | ICD-10-CM | POA: Diagnosis not present

## 2015-12-25 LAB — BASIC METABOLIC PANEL
BUN: 74 mg/dL — AB (ref 6–23)
CO2: 34 meq/L — AB (ref 19–32)
CREATININE: 2.95 mg/dL — AB (ref 0.40–1.50)
Calcium: 9.3 mg/dL (ref 8.4–10.5)
Chloride: 101 mEq/L (ref 96–112)
GFR: 22.48 mL/min — ABNORMAL LOW (ref >60.00)
GLUCOSE: 245 mg/dL — AB (ref 70–99)
Potassium: 3.9 mEq/L (ref 3.5–5.1)
Sodium: 141 mEq/L (ref 135–145)

## 2015-12-25 LAB — MICROALBUMIN / CREATININE URINE RATIO
Creatinine,U: 107.6 mg/dL
Microalb Creat Ratio: 42.1 mg/g — ABNORMAL HIGH (ref 0.0–30.0)
Microalb, Ur: 45.3 mg/dL — ABNORMAL HIGH (ref 0.0–1.9)

## 2015-12-25 LAB — HEMOGLOBIN A1C: Hgb A1c MFr Bld: 6.2 % (ref 4.6–6.5)

## 2015-12-25 MED ORDER — NIFEDIPINE ER 90 MG PO TB24: 90.0000 mg | ORAL_TABLET | Freq: Every day | ORAL | 3 refills | Status: DC

## 2015-12-25 MED ORDER — SERTRALINE HCL 100 MG PO TABS: 100.0000 mg | ORAL_TABLET | Freq: Every day | ORAL | 3 refills | Status: DC

## 2015-12-25 MED ORDER — GLIPIZIDE 10 MG PO TABS: 10.0000 mg | ORAL_TABLET | Freq: Three times a day (TID) | ORAL | 3 refills | Status: DC

## 2015-12-25 MED ORDER — LISINOPRIL 40 MG PO TABS: 40.0000 mg | ORAL_TABLET | Freq: Every day | ORAL | 3 refills | Status: DC

## 2015-12-25 MED ORDER — PIOGLITAZONE HCL 30 MG PO TABS: 30.0000 mg | ORAL_TABLET | Freq: Every day | ORAL | 3 refills | Status: DC

## 2015-12-25 MED ORDER — HYDROCHLOROTHIAZIDE 25 MG PO TABS: 25.0000 mg | ORAL_TABLET | Freq: Every day | ORAL | 3 refills | Status: DC

## 2015-12-25 MED ORDER — SIMVASTATIN 40 MG PO TABS: 40.0000 mg | ORAL_TABLET | Freq: Every day | ORAL | 3 refills | Status: DC

## 2015-12-25 NOTE — Patient Instructions (Signed)
It was nice to see you today!  Please go to lab, and then to the imaging department on the ground floor to have x-rays of your left ribs I will be in touch with these results asap I sent in 90 days supplies with refills of your medications to your [facility] today, and did a referral for a nephrologist for you.   Please come and see me in 4-6 months to check in- let me know if you need anything further in the meantime

## 2015-12-25 NOTE — Addendum Note (Signed)
Addended by: Emi Holes on: 12/25/2015 03:41 PM   Modules accepted: Orders

## 2015-12-25 NOTE — Progress Notes (Addendum)
[place] [facility] [address] [place], [place] [address] [phone]  Date:  12/25/2015   Name:  Scott Jones   DOB:  [birth year]   MRN:  [id number]  PCP:  Lamar Blinks, MD    Chief Complaint: Establish Care (Pt here to est care. Seen by [facility] for several years. Pt is not satisified with services received from [place]. Will need referral for nephrologist. )   History of Present Illness:  Scott Jones is a 70 y.o. very pleasant male patient who presents with the following:  Here today as a new patient to establish care- I did see him a few years ago for a wound on his hand at [facility]. He has been getting most of his care at the [place] but now has health team advantage which he would like to use instead of the [place].    He would like a referral to a nephrologist; he has chronic kidney disease, stage IV as of 2013.   He had a partial left nephrectomy in 2013 for a benign tumor. He went from stage III to IV after the operation.   He reports that his DM has been under good control- he started actos and this "has made a major difference," his A1c was approx 6 at his last check in May of this year  He uses actos and glipizide His renal disease is thought due to his DM and HTN He does monitor his BP at home- it is under control  His hep C was treated in the 1990s- he was treated with interferon.  He is cured at this time but he cannot donate blood.    He had asthma as a child- no longer bothersome to him He does have hearing loss- recently got new hearing aids from the [place] that have a bluetooth connection for his phone also!   He also has noted a pain at his left inferior rib border when he tries to sit up in bed for the last month or so,  He has not noted anything else abnormal on self exam, NKI.  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 DM (diabetes mellitus) ([place]) 02/02/2012  . HTN (hypertension) 02/02/2012    Past Medical History:  Diagnosis Date  . Allergy   . Asthma   . Chronic kidney disease   . Depression   . Diabetes mellitus   . Hepatitis C   . Hypertension     Past Surgical History:  Procedure Laterality Date  . NEPHRECTOMY     partial - benign tumor  . PARTIAL NEPHRECTOMY  10/08/2011   Left kidney    Social History  Substance Use Topics  . Smoking status: Former Smoker    Packs/day: 1.00    Years: 11.00    Types: Cigarettes, Pipe    Quit date: 08/07/1974  . Smokeless tobacco: Never Used  . Alcohol use 1.8 oz/week    3 Cans of beer per week    Family History  Problem Relation Age of Onset  . Diabetes Mother   . Hypertension Mother   . COPD Mother   . Asthma Mother   .  Diabetes Father   . Diabetes Sister   . Diabetes Brother     No Known Allergies  Medication list has been reviewed and updated.  Current Outpatient Prescriptions on File Prior to Visit  Medication Sig Dispense Refill  . aspirin 81 MG tablet Take 81 mg by mouth daily.    . cholecalciferol (VITAMIN D) 1000 UNITS tablet Take 1,000 Units by mouth daily.    . hydrochlorothiazide (HYDRODIURIL) 25 MG tablet Take 1 tablet (25 mg total) by mouth daily. 15 tablet 0  . indomethacin (INDOCIN) 50 MG capsule Take 1 capsule (50 mg total) by mouth 3 (three) times daily with meals. 30 capsule 3  . lisinopril (PRINIVIL,ZESTRIL) 40 MG tablet Take 1 tablet (40 mg total) by mouth daily. 15 tablet 0  . NIFEdipine (ADALAT CC) 90 MG 24 hr tablet Take 1 tablet (90 mg total) by mouth daily. 30 tablet 0  . saxagliptin HCl (ONGLYZA) 2.5 MG TABS tablet Take 1 tablet (2.5 mg total) by mouth daily. 30 tablet 0  . sertraline (ZOLOFT) 100 MG tablet Take 1 tablet (100 mg total) by mouth daily. 30 tablet 0  . simvastatin (ZOCOR) 40 MG tablet  Take 0.5 tablets (20 mg total) by mouth every evening. 30 tablet 0  . tadalafil (CIALIS) 20 MG tablet Take 1 tablet (20 mg total) by mouth every other day as needed for erectile dysfunction. 10 tablet 5  . vitamin C (ASCORBIC ACID) 500 MG tablet Take 500 mg by mouth daily.     No current facility-administered medications on file prior to visit.     Review of Systems:  As per HPI- otherwise negative.   Physical Examination: Vitals:   12/25/15 1059  BP: 138/78  Pulse: 64  Temp: 97.9 F (36.6 C)   Vitals:   12/25/15 1059  Weight: 187 lb 3.2 oz (84.9 kg)  Height: $Remove'5\' 6"'[id number]$  (1.676 m)   Body mass index is 30.21 kg/m. Ideal Body Weight: Weight in (lb) to have BMI = 25: 154.6  GEN: WDWN, NAD, Non-toxic, A & O x 3 HEENT: Atraumatic, Normocephalic. Neck supple. No masses, No LAD. Ears and Nose: No external deformity. CV: RRR, No M/G/R. No JVD. No thrill. No extra heart sounds. PULM: CTA B, no wheezes, crackles, rhonchi. No retractions. No resp. distress. No accessory muscle use. ABD: S, NT, ND, +BS. No rebound. No HSM. EXTR: No c/c/e NEURO Normal gait.  PSYCH: Normally interactive. Conversant. Not depressed or anxious appearing.  Calm demeanor.  Examined area of tenderness at his left ribs.  He does have a firm mass under the skin at the area of concern- seems to be a lipoma.  No ventral hernia appreciated No swelling, redness or change of the skin   Assessment and Plan: Controlled type 2 diabetes mellitus with stage 4 chronic kidney disease, without long-term current use of insulin ([place]) - Plan: Ambulatory referral to Nephrology, Basic metabolic panel, Hemoglobin A1c, Urine Microalbumin w/creat. ratio, pioglitazone (ACTOS) 30 MG tablet  Essential hypertension - Plan: hydrochlorothiazide (HYDRODIURIL) 25 MG tablet, lisinopril (PRINIVIL,ZESTRIL) 40 MG tablet, NIFEdipine (ADALAT CC) 90 MG 24 hr tablet  Rib pain on left side - Plan: DG Ribs Unilateral W/Chest Left  Chronic renal  disease, stage 4 (severe) ([place]) - Plan: Ambulatory referral to Nephrology, Urine Microalbumin w/creat. ratio, glipiZIDE (GLUCOTROL) 10 MG tablet  Dyslipidemia - Plan: simvastatin (ZOCOR) 40 MG tablet  Adjustment disorder with mixed anxiety and depressed mood - Plan: sertraline (ZOLOFT) 100 MG tablet  Await labs today  Referral to nephrology for his chronic renal disease, will check status today Plain films for rib pain- called and let him know that these are negaitve  Refilled all medications today  Dg Ribs Unilateral W/chest Left  Result Date: 12/25/2015 CLINICAL DATA:  One month history of left-sided chest pain. EXAM: LEFT RIBS AND CHEST - 3+ VIEW COMPARISON:  Chest x-ray 08/08/2014 FINDINGS: The cardiac silhouette, mediastinal and hilar contours are within normal limits and stable. No acute pulmonary findings. Dedicated views of the left ribs did not demonstrate any definite acute left-sided rib fractures or worrisome bone lesions. IMPRESSION: No acute cardiopulmonary findings and no definite left-sided rib fractures. Electronically Signed   By: Marijo Sanes M.D.   On: 12/25/2015 12:06   Signed Lamar Blinks, MD  Received his labs. Creat clearance is 3ml/min.  We should decrease his glucotrol to BID to adjust for renal impairment. Otherwise med doses are ok  Results for orders placed or performed in visit on 81/77/11  Basic metabolic panel  Result Value Ref Range   Sodium 141 135 - 145 mEq/L   Potassium 3.9 3.5 - 5.1 mEq/L   Chloride 101 96 - 112 mEq/L   CO2 34 (H) 19 - 32 mEq/L   Glucose, Bld 245 (H) 70 - 99 mg/dL   BUN 74 (H) 6 - 23 mg/dL   Creatinine, Ser 2.95 (H) 0.40 - 1.50 mg/dL   Calcium 9.3 8.4 - 10.5 mg/dL   GFR 22.48 (L) >60.00 mL/min  Hemoglobin A1c  Result Value Ref Range   Hgb A1c MFr Bld 6.2 4.6 - 6.5 %  Urine Microalbumin w/creat. ratio  Result Value Ref Range   Microalb, Ur 45.3 (H) 0.0 - 1.9 mg/dL   Creatinine,U 107.6 mg/dL   Microalb Creat Ratio 42.1 (H)  0.0 - 30.0 mg/g   A1c is fine, but his renal function is significantly worse c/w our most recent labs (which are 70 years old)- do not have more recent numbers for comparison.  Agree with nephrology evaluation. Will ask pt to let me know if appt is more than 4 weeks away, if so will repeat labs in the meantime

## 2015-12-25 NOTE — Progress Notes (Signed)
Pre visit review using our clinic review tool, if applicable. No additional management support is needed unless otherwise documented below in the visit note. 

## 2015-12-26 ENCOUNTER — Encounter: Payer: Self-pay | Admitting: Family Medicine

## 2015-12-28 ENCOUNTER — Encounter: Payer: Self-pay | Admitting: Family Medicine

## 2015-12-28 DIAGNOSIS — F32A Depression, unspecified: Secondary | ICD-10-CM | POA: Insufficient documentation

## 2015-12-28 DIAGNOSIS — J309 Allergic rhinitis, unspecified: Secondary | ICD-10-CM | POA: Insufficient documentation

## 2015-12-28 DIAGNOSIS — E785 Hyperlipidemia, unspecified: Secondary | ICD-10-CM | POA: Insufficient documentation

## 2015-12-28 DIAGNOSIS — N184 Chronic kidney disease, stage 4 (severe): Secondary | ICD-10-CM | POA: Insufficient documentation

## 2015-12-28 DIAGNOSIS — F329 Major depressive disorder, single episode, unspecified: Secondary | ICD-10-CM | POA: Insufficient documentation

## 2015-12-28 DIAGNOSIS — Z8619 Personal history of other infectious and parasitic diseases: Secondary | ICD-10-CM | POA: Insufficient documentation

## 2016-02-06 DIAGNOSIS — I129 Hypertensive chronic kidney disease with stage 1 through stage 4 chronic kidney disease, or unspecified chronic kidney disease: Secondary | ICD-10-CM | POA: Diagnosis not present

## 2016-02-06 DIAGNOSIS — N184 Chronic kidney disease, stage 4 (severe): Secondary | ICD-10-CM | POA: Diagnosis not present

## 2016-02-06 DIAGNOSIS — E1122 Type 2 diabetes mellitus with diabetic chronic kidney disease: Secondary | ICD-10-CM | POA: Diagnosis not present

## 2016-02-06 DIAGNOSIS — Z6830 Body mass index (BMI) 30.0-30.9, adult: Secondary | ICD-10-CM | POA: Diagnosis not present

## 2016-02-16 ENCOUNTER — Encounter: Payer: Self-pay | Admitting: Family Medicine

## 2016-02-16 DIAGNOSIS — Z905 Acquired absence of kidney: Secondary | ICD-10-CM | POA: Insufficient documentation

## 2016-03-18 DIAGNOSIS — I129 Hypertensive chronic kidney disease with stage 1 through stage 4 chronic kidney disease, or unspecified chronic kidney disease: Secondary | ICD-10-CM | POA: Diagnosis not present

## 2016-03-18 DIAGNOSIS — Z6830 Body mass index (BMI) 30.0-30.9, adult: Secondary | ICD-10-CM | POA: Diagnosis not present

## 2016-03-18 DIAGNOSIS — Z23 Encounter for immunization: Secondary | ICD-10-CM | POA: Diagnosis not present

## 2016-03-18 DIAGNOSIS — N2581 Secondary hyperparathyroidism of renal origin: Secondary | ICD-10-CM | POA: Diagnosis not present

## 2016-03-18 DIAGNOSIS — D631 Anemia in chronic kidney disease: Secondary | ICD-10-CM | POA: Diagnosis not present

## 2016-03-18 DIAGNOSIS — N184 Chronic kidney disease, stage 4 (severe): Secondary | ICD-10-CM | POA: Diagnosis not present

## 2016-03-18 DIAGNOSIS — E1122 Type 2 diabetes mellitus with diabetic chronic kidney disease: Secondary | ICD-10-CM | POA: Diagnosis not present

## 2016-04-20 ENCOUNTER — Encounter: Payer: Self-pay | Admitting: Family Medicine

## 2016-04-20 ENCOUNTER — Telehealth: Payer: Self-pay | Admitting: Family Medicine

## 2016-04-20 NOTE — Telephone Encounter (Signed)
Received his records from the [place]- will abstract and scan as indicated

## 2016-04-25 ENCOUNTER — Telehealth: Payer: Self-pay | Admitting: Family Medicine

## 2016-04-25 NOTE — Telephone Encounter (Signed)
Received records from the [place]- will abstract and scan as appropriate

## 2016-04-28 ENCOUNTER — Encounter: Payer: Self-pay | Admitting: Medical

## 2016-04-28 ENCOUNTER — Ambulatory Visit (INDEPENDENT_AMBULATORY_CARE_PROVIDER_SITE_OTHER): Payer: PPO | Admitting: Medical

## 2016-04-28 VITALS — BP 128/78 | HR 62 | Temp 97.7°F | Resp 16 | Ht 66.0 in | Wt 188.2 lb

## 2016-04-28 DIAGNOSIS — H9201 Otalgia, right ear: Secondary | ICD-10-CM

## 2016-04-28 MED ORDER — FLUTICASONE PROPIONATE 50 MCG/ACT NA SUSP: 2.0000 | Freq: Every day | NASAL | 1 refills | Status: DC

## 2016-04-28 MED ORDER — AMOXICILLIN-POT CLAVULANATE 875-125 MG PO TABS
1.0000 | ORAL_TABLET | Freq: Two times a day (BID) | ORAL | 0 refills | Status: DC
Start: 2016-04-28 — End: 2016-07-26

## 2016-04-28 MED ORDER — NEOMYCIN-POLYMYXIN-HC 3.5-10000-1 OT SOLN: 3.0000 [drp] | Freq: Four times a day (QID) | OTIC | 0 refills | Status: DC

## 2016-04-28 NOTE — Progress Notes (Signed)
Subjective:    Patient ID: Scott Jones, male    DOB: [birth year], 70 y.o.   MRN: [id number]  HPI  Pt in with some ear pain every few minutes or so since yesterday. Pt states one month ago had some nasal congestion and cough with ear pain. Those symptoms resolved but then yesterday ear pain/pressure returned minus prior symptoms.  No fever, no chills or sweats.  No prior ear infection as an adult.  Pt did have some hx of tmj pain in past.  Yesterday some pain behind the ear yesterday. But no pain behind ear presently. Deep pain in ear yesterday but not the case today.   Review of Systems  Constitutional: Negative for chills, fatigue and fever.  HENT: Positive for ear pain. Negative for congestion, ear discharge, sinus pain and sneezing.   Respiratory: Negative for cough, chest tightness, shortness of breath and wheezing.   Cardiovascular: Negative for chest pain and palpitations.  Gastrointestinal: Negative for abdominal pain, constipation, diarrhea, nausea and vomiting.  Musculoskeletal: Negative for back pain.  Skin: Negative for rash.  Neurological: Negative for dizziness, weakness, numbness and headaches.  Hematological: Negative for adenopathy. Does not bruise/bleed easily.  Psychiatric/Behavioral: Negative for behavioral problems and confusion.    Past Medical History:  Diagnosis Date  . Allergy   . Asthma   . Chronic kidney disease   . Depression   . Diabetes mellitus   . Hepatitis C   . Hypertension      Social History   Social History  . Marital status: Married    Spouse name: N/A  . Number of children: N/A  . Years of education: N/A   Occupational History  . Not on file.   Social History Main Topics  . Smoking status: Former Smoker    Packs/day: 1.00    Years: 11.00    Types: Cigarettes, Pipe    Quit date: 08/07/1974  . Smokeless tobacco: Never Used  . Alcohol use 1.8 oz/week    3 Cans of beer per week  . Drug use: No  . Sexual activity:  Yes    Birth control/ protection: None   Other Topics Concern  . Not on file   Social History Narrative  . No narrative on file    Past Surgical History:  Procedure Laterality Date  . NEPHRECTOMY     partial - benign tumor  . PARTIAL NEPHRECTOMY  10/08/2011   Left kidney    Family History  Problem Relation Age of Onset  . Diabetes Mother   . Hypertension Mother   . COPD Mother   . Asthma Mother   . Diabetes Father   . Diabetes Sister   . Diabetes Brother     No Known Allergies  Current Outpatient Prescriptions on File Prior to Visit  Medication Sig Dispense Refill  . aspirin 81 MG tablet Take 81 mg by mouth daily.    . cholecalciferol (VITAMIN D) 1000 UNITS tablet Take 1,000 Units by mouth daily.    Marland Kitchen glipiZIDE (GLUCOTROL) 10 MG tablet Take 1 tablet (10 mg total) by mouth 3 (three) times daily. (Patient taking differently: Take 10 mg by mouth 2 (two) times daily before a meal. ) 270 tablet 3  . lisinopril (PRINIVIL,ZESTRIL) 40 MG tablet Take 1 tablet (40 mg total) by mouth daily. 90 tablet 3  . NIFEdipine (ADALAT CC) 90 MG 24 hr tablet Take 1 tablet (90 mg total) by mouth daily. 90 tablet 3  . pioglitazone (ACTOS)  30 MG tablet Take 1 tablet (30 mg total) by mouth daily. 90 tablet 3  . sertraline (ZOLOFT) 100 MG tablet Take 1 tablet (100 mg total) by mouth daily. 90 tablet 3  . simvastatin (ZOCOR) 40 MG tablet Take 1 tablet (40 mg total) by mouth daily. 90 tablet 3  . tadalafil (CIALIS) 20 MG tablet Take 1 tablet (20 mg total) by mouth every other day as needed for erectile dysfunction. 10 tablet 5   No current facility-administered medications on file prior to visit.     BP 128/78 (BP Location: Left Arm, Patient Position: Sitting, Cuff Size: Large)   Pulse 62   Temp 97.7 F (36.5 C) (Oral)   Resp 16   Ht $R'5\' 6"'Hd$  (1.676 m)   Wt 188 lb 4 oz (85.4 kg)   SpO2 99%   BMI 30.38 kg/m       Objective:   Physical Exam  General  Mental Status - Alert. General  Appearance - Well groomed. Not in acute distress.  Skin Rashes- No Rashes.  HEENT Head- Normal. Ear Auditory Canal - Left- Normal. Right - faint mild swollen appearance.Tympanic Membrane- Left- Normal. Right- Normal.(no ovbious pain rt mastoid area presently) Eye Sclera/Conjunctiva- Left- Normal. Right- Normal. Nose & Sinuses Nasal Mucosa- Left-  Boggy and Congested. Right-  Boggy and  Congested.Bilateral no maxillary and  no frontal sinus pressure. Mouth & Throat Lips: Upper Lip- Normal: no dryness, cracking, pallor, cyanosis, or vesicular eruption. Lower Lip-Normal: no dryness, cracking, pallor, cyanosis or vesicular eruption. Buccal Mucosa- Bilateral- No Aphthous ulcers. Oropharynx- No Discharge or Erythema. Tonsils: Characteristics- Bilateral- No Erythema or Congestion. Size/Enlargement- Bilateral- No enlargement. Discharge- bilateral-None.  Neck Neck- Supple. No Masses.   Chest and Lung Exam Auscultation: Breath Sounds:-Clear even and unlabored.  Cardiovascular Auscultation:Rythm- Regular, rate and rhythm. Murmurs & Other Heart Sounds:Ausculatation of the heart reveal- No Murmurs.  Lymphatic Head & Neck General Head & Neck Lymphatics: Bilateral: Description- No Localized lymphadenopathy.    Neurologic Cranial Nerve exam:- CN III-XII intact(No nystagmus), symmetric smile. Finger to Nose:- Normal/Intact Strength:- 5/5 equal and symmetric strength both upper and lower extremities.       Assessment & Plan:  For your ear pain I want you to take flonase nasal spray. Also I want you start cortisoporin ear drops. I think you have pressure in eustachian tube and some inflammation of the ear canal.(for next  4 day recommend not using your rt ear hearing aid)  If your have worse ear pain despite the above then start augmentin.   If any severe ear pain, ha or neurologic symptoms then ED evaluation.   Follow up in 7 days  Or as needed  Shahin Knierim, Percell Miller, [employer]

## 2016-04-28 NOTE — Progress Notes (Signed)
Pre visit review using our clinic review tool, if applicable. No additional management support is needed unless otherwise documented below in the visit note/SLS  

## 2016-04-28 NOTE — Patient Instructions (Addendum)
For your ear pain I want you to take flonase nasal spray. Also I want you start cortisoporin ear drops. I think you have pressure in eustachian tube and some inflammation of the ear canal.(for next  4 days recommend not using your rt ear hearing aid)  If your have worse ear pain despite the above then start augmentin.   If any severe ear pain, ha or neurologic symptoms then ED evaluation.   Follow up in 7 days  Or as needed

## 2016-04-30 ENCOUNTER — Encounter: Payer: Self-pay | Admitting: Family Medicine

## 2016-05-14 DIAGNOSIS — Z6830 Body mass index (BMI) 30.0-30.9, adult: Secondary | ICD-10-CM | POA: Diagnosis not present

## 2016-05-14 DIAGNOSIS — N2581 Secondary hyperparathyroidism of renal origin: Secondary | ICD-10-CM | POA: Diagnosis not present

## 2016-05-14 DIAGNOSIS — I129 Hypertensive chronic kidney disease with stage 1 through stage 4 chronic kidney disease, or unspecified chronic kidney disease: Secondary | ICD-10-CM | POA: Diagnosis not present

## 2016-05-14 DIAGNOSIS — E1122 Type 2 diabetes mellitus with diabetic chronic kidney disease: Secondary | ICD-10-CM | POA: Diagnosis not present

## 2016-05-14 DIAGNOSIS — D631 Anemia in chronic kidney disease: Secondary | ICD-10-CM | POA: Diagnosis not present

## 2016-05-14 DIAGNOSIS — N184 Chronic kidney disease, stage 4 (severe): Secondary | ICD-10-CM | POA: Diagnosis not present

## 2016-07-08 DIAGNOSIS — H40033 Anatomical narrow angle, bilateral: Secondary | ICD-10-CM | POA: Diagnosis not present

## 2016-07-08 DIAGNOSIS — H5203 Hypermetropia, bilateral: Secondary | ICD-10-CM | POA: Diagnosis not present

## 2016-07-16 DIAGNOSIS — D631 Anemia in chronic kidney disease: Secondary | ICD-10-CM | POA: Diagnosis not present

## 2016-07-16 DIAGNOSIS — Z6830 Body mass index (BMI) 30.0-30.9, adult: Secondary | ICD-10-CM | POA: Diagnosis not present

## 2016-07-16 DIAGNOSIS — N2581 Secondary hyperparathyroidism of renal origin: Secondary | ICD-10-CM | POA: Diagnosis not present

## 2016-07-16 DIAGNOSIS — N184 Chronic kidney disease, stage 4 (severe): Secondary | ICD-10-CM | POA: Diagnosis not present

## 2016-07-16 DIAGNOSIS — I129 Hypertensive chronic kidney disease with stage 1 through stage 4 chronic kidney disease, or unspecified chronic kidney disease: Secondary | ICD-10-CM | POA: Diagnosis not present

## 2016-07-16 DIAGNOSIS — E1122 Type 2 diabetes mellitus with diabetic chronic kidney disease: Secondary | ICD-10-CM | POA: Diagnosis not present

## 2016-07-26 ENCOUNTER — Encounter: Payer: Self-pay | Admitting: Family Medicine

## 2016-07-26 ENCOUNTER — Ambulatory Visit (INDEPENDENT_AMBULATORY_CARE_PROVIDER_SITE_OTHER): Payer: PPO | Admitting: Family Medicine

## 2016-07-26 VITALS — BP 134/80 | HR 72 | Temp 97.9°F | Ht 66.0 in | Wt 191.0 lb

## 2016-07-26 DIAGNOSIS — E11649 Type 2 diabetes mellitus with hypoglycemia without coma: Secondary | ICD-10-CM | POA: Diagnosis not present

## 2016-07-26 DIAGNOSIS — N183 Chronic kidney disease, stage 3 unspecified: Secondary | ICD-10-CM

## 2016-07-26 DIAGNOSIS — E1122 Type 2 diabetes mellitus with diabetic chronic kidney disease: Secondary | ICD-10-CM

## 2016-07-26 DIAGNOSIS — E785 Hyperlipidemia, unspecified: Secondary | ICD-10-CM

## 2016-07-26 DIAGNOSIS — N184 Chronic kidney disease, stage 4 (severe): Secondary | ICD-10-CM | POA: Diagnosis not present

## 2016-07-26 LAB — LIPID PANEL
CHOL/HDL RATIO: 3
CHOLESTEROL: 131 mg/dL (ref 0–200)
HDL: 38.9 mg/dL — ABNORMAL LOW (ref >39.00)
LDL CALC: 65 mg/dL (ref 0–99)
NonHDL: 92.26
Triglycerides: 137 mg/dL (ref 0.0–149.0)
VLDL: 27.4 mg/dL (ref 0.0–40.0)

## 2016-07-26 LAB — BASIC METABOLIC PANEL
BUN: 80 mg/dL — AB (ref 6–23)
CHLORIDE: 102 meq/L (ref 96–112)
CO2: 29 meq/L (ref 19–32)
Calcium: 9.5 mg/dL (ref 8.4–10.5)
Creatinine, Ser: 3.89 mg/dL — ABNORMAL HIGH (ref 0.40–1.50)
GFR: 16.31 mL/min — ABNORMAL LOW (ref >60.00)
Glucose, Bld: 123 mg/dL — ABNORMAL HIGH (ref 70–99)
POTASSIUM: 3.9 meq/L (ref 3.5–5.1)
Sodium: 143 mEq/L (ref 135–145)

## 2016-07-26 LAB — HEMOGLOBIN A1C
HEMOGLOBIN A1C: 5.3 % (ref <5.7)
MEAN PLASMA GLUCOSE: 105 mg/dL

## 2016-07-26 MED ORDER — FUROSEMIDE 40 MG PO TABS: ORAL_TABLET | ORAL | 3 refills | Status: DC

## 2016-07-26 NOTE — Progress Notes (Addendum)
[place] at [facility] [address], [place], [place] [address] [phone]  Date:  07/26/2016   Name:  Scott Jones   DOB:  08-13-1945   MRN:  [id number]  PCP:  Lamar Blinks, MD    Chief Complaint: Follow-up (Pt here for 4 month f/u visit. Pt would like to discuss blood sugar, states that he has had a lot of blood sugar drops. )   History of Present Illness:  Scott Jones is a 71 y.o. very pleasant male patient who presents with the following:  Here for follow-up on DM, HTN and CKD.  Has seen Dr. Mercy Moore with [facility].  Last notes we have are from 05-14-2016, but most recent visit was about 10 days ago, should receive this note soon.  He stated that Dr. Mercy Moore started on Calcitriol on MWF.  Now taking TUMS with every meal.  Has limited phosphorus intake.  Does pay attention to sodium & potassium intake.  Still urinating ok.  Not drinking fluids as much.  Patient states that he is feeling OK.    Is taking Glipizide BID and Actos daily.  Feels like he is having some hypoglycemia.  States that if he take Glipizide with dinner, he feels better.  If he takes it after he eats he will more frequently feel like he is hypoglycemia  CBG as low as 47 about 3 weeks.  Reports having hypoglycemic episodes approximately two times per week.  Typically brings some form of sugar with him, for hypoglycemic events.  He has not had any syncope or other severe SE from hypoglycemia   Blood pressure has been normal, here recently.  Checks his BP about twice per week.  He is using lasix 40 mg, 2 am and 1 pm- pt sent me a mychart message to clarify his dosage after visit  They are 40 mg tabs. They may have been a lower dosage when I was only taking one a day, but they are 40 mg now: Two tabs in the morning and one at night.     Also, I couldn't remember the date of my last appt. with Dr. Mercy Moore at [facility].  Your record of the last session was Dec. 22nd. Actually, I had a more recent appt. on Friday, Feb. 23rd. FYI       Patient Active Problem List   Diagnosis Date Noted  . H/O partial nephrectomy 02/16/2016  . Depression (emotion) 12/28/2015  . Allergic rhinitis 12/28/2015  . History of hepatitis C 12/28/2015  . Dyslipidemia 12/28/2015  . Chronic kidney disease (CKD), stage IV (severe) ([place]) 12/28/2015  . DM (diabetes mellitus) ([place]) 02/02/2012  . HTN (hypertension) 02/02/2012    Past Medical History:  Diagnosis Date  . Allergy   . Asthma   . Chronic kidney disease   . Depression   . Diabetes mellitus   . Hepatitis C   . Hypertension     Past Surgical History:  Procedure Laterality Date  . NEPHRECTOMY     partial - benign tumor  . PARTIAL NEPHRECTOMY  10/08/2011   Left kidney    Social History  Substance Use Topics  . Smoking status: Former Smoker    Packs/day: 1.00    Years: 11.00    Types: Cigarettes, Pipe    Quit date: 08/07/1974  . Smokeless tobacco: Never Used  . Alcohol use 1.8 oz/week    3 Cans of beer per week  Family History  Problem Relation Age of Onset  . Diabetes Mother   . Hypertension Mother   . COPD Mother   . Asthma Mother   . Diabetes Father   . Diabetes Sister   . Diabetes Brother     No Known Allergies  Medication list has been reviewed and updated.  Current Outpatient Prescriptions on File Prior to Visit  Medication Sig Dispense Refill  . aspirin 81 MG tablet Take 81 mg by mouth daily.    Marland Kitchen glipiZIDE (GLUCOTROL) 10 MG tablet Take 1 tablet (10 mg total) by mouth 3 (three) times daily. (Patient taking differently: Take 10 mg by mouth 2 (two) times daily before a meal. ) 270 tablet 3  . lisinopril (PRINIVIL,ZESTRIL) 40 MG tablet Take 1 tablet (40 mg total) by mouth daily. 90 tablet 3  . NIFEdipine (ADALAT CC) 90 MG 24 hr tablet Take 1 tablet (90 mg total) by mouth daily. 90 tablet 3  . pioglitazone (ACTOS) 30 MG tablet Take 1  tablet (30 mg total) by mouth daily. 90 tablet 3  . sertraline (ZOLOFT) 100 MG tablet Take 1 tablet (100 mg total) by mouth daily. 90 tablet 3  . simvastatin (ZOCOR) 40 MG tablet Take 1 tablet (40 mg total) by mouth daily. 90 tablet 3   No current facility-administered medications on file prior to visit.     Review of Systems:  As per HPI, otherwise negative.  Physical Examination: Vitals:   07/26/16 1011  BP: 134/80  Pulse: 72  Temp: 97.9 F (36.6 C)   Vitals:   07/26/16 1011  Weight: 191 lb (86.6 kg)  Height: $Remove'5\' 6"'XJutOap$  (1.676 m)   Body mass index is 30.83 kg/m. Ideal Body Weight: Weight in (lb) to have BMI = 25: 154.6  Physical Examination: General appearance - alert, well appearing, and in no distress, oriented to person, place, and time and overweight Mental status - alert, oriented to person, place, and time, normal mood, behavior, speech, dress, motor activity, and thought processes Eyes - pupils equal and reactive, extraocular eye movements intact Neck - supple, no significant adenopathy Lymphatics - no palpable lymphadenopathy, no hepatosplenomegaly Chest - clear to auscultation, no wheezes, rales or rhonchi, symmetric air entry Heart - normal rate, regular rhythm, normal S1, S2, no murmurs, rubs, clicks or gallops Abdomen - soft, nontender, nondistended, no masses or organomegaly Neurological - alert, oriented, normal speech, no focal findings or movement disorder noted, normal diabetic foot exam Musculoskeletal - no joint tenderness, deformity or swelling Extremities - peripheral pulses normal, no pedal edema, no clubbing or cyanosis, normal diabetic foot exam Skin - normal coloration and turgor, no rashes, no suspicious skin lesions noted   Assessment and Plan:  Controlled type 2 diabetes mellitus with stage 4 chronic kidney disease, without long-term current use of insulin (HCC) - Plan: Hemoglobin M4Q, Basic metabolic panel  Dyslipidemia - Plan: Lipid  panel  Hypoglycemia associated with diabetes ([place]) - Plan: Basic metabolic panel  CKD (chronic kidney disease) stage 3, GFR 30-59 ml/min Here today to discuss his DM and concerns about hypoglycemia.  As his renal function worsens he may be at more risk of hypoglycemia from his glipizide.  Will have him immediately cut his dose in 1/2 and may stop this all together pending his lab results/ A1c Await labs and will be in touch with him  Patient instructions:  [facility] to [facility] of your [facility] the next time you see them  For the time being please cut your glipizide dose down to 1/2 ($Remov'5mg'IoHHRK$ ) twice a day and be sure to take the evening dose with a meal We will get an A1c for you today If you continue to have low blood sugars we will plan to stop the glipizide.  Be sure to ALWAYS have a source of sugar on you in case you feel like you are getting low  Signed Lamar Blinks, MD  Received his labs  Results for orders placed or performed in visit on 07/26/16  Hemoglobin A1c  Result Value Ref Range   Hgb A1c MFr Bld 5.3 <5.7 %   Mean Plasma Glucose 105 mg/dL  Basic metabolic panel  Result Value Ref Range   Sodium 143 135 - 145 mEq/L   Potassium 3.9 3.5 - 5.1 mEq/L   Chloride 102 96 - 112 mEq/L   CO2 29 19 - 32 mEq/L   Glucose, Bld 123 (H) 70 - 99 mg/dL   BUN 80 (H) 6 - 23 mg/dL   Creatinine, Ser 3.89 (H) 0.40 - 1.50 mg/dL   Calcium 9.5 8.4 - 10.5 mg/dL   GFR 16.31 (L) >60.00 mL/min  Lipid panel  Result Value Ref Range   Cholesterol 131 0 - 200 mg/dL   Triglycerides 137.0 0.0 - 149.0 mg/dL   HDL 38.90 (L) >39.00 mg/dL   VLDL 27.4 0.0 - 40.0 mg/dL   LDL Cholesterol 65 0 - 99 mg/dL   Total CHOL/HDL Ratio 3    NonHDL 92.26    Message to pt, will fax to Dr. Mercy Moore as well

## 2016-07-26 NOTE — Patient Instructions (Signed)
[facility] to give you a copy of your immunization records the next time you see them   For the time being please cut your glipizide dose down to 1/2 ($Remov'5mg'KRShqo$ ) twice a day and be sure to take the evening dose with a meal We will get an A1c for you today If you continue to have low blood sugars we will plan to stop the glipizide.  Be sure to ALWAYS have a source of sugar on you in case you feel like you are getting low

## 2016-07-27 ENCOUNTER — Encounter: Payer: Self-pay | Admitting: Family Medicine

## 2016-08-02 DIAGNOSIS — H40033 Anatomical narrow angle, bilateral: Secondary | ICD-10-CM | POA: Diagnosis not present

## 2016-08-10 DIAGNOSIS — E876 Hypokalemia: Secondary | ICD-10-CM | POA: Diagnosis not present

## 2016-08-11 NOTE — Progress Notes (Signed)
Pre visit review using our clinic review tool, if applicable. No additional management support is needed unless otherwise documented below in the visit note. 

## 2016-08-11 NOTE — Progress Notes (Addendum)
Subjective:   Scott Jones is a 71 y.o. male who presents for an Initial Medicare Annual Wellness Visit.  The Patient was informed that the wellness visit is to identify future health risk and educate and initiate measures that can reduce risk for increased disease through the lifespan.   Describes health as fair, good or great? "I have a lot of issues."  Was previously seen by the [place], but is in the process of transferring much of his care elsewhere because he feels his care there was not sufficient.  PCP recently decreased his dose of glipizide to 5 mg BID due to hypoglycemia. Since then, he has been experiencing much less frequent lows and thinks this dose will work for him. He did wake up this morning feeling shaky. He ate a few glucose gummies and checked his CBG, which was 88.  Review of Systems  No ROS.  Medicare Wellness Visit.  Cardiac Risk Factors include: obesity (BMI >30kg/m2);advanced age (>22men, >75 women);male gender;hypertension;dyslipidemia;diabetes mellitus  Sleep patterns: no sleep issues, falls asleep easily, awakens early, feels rested on waking and does not get up to void.   Home Safety/Smoke Alarms: Feels safe in home. Smoke alarms in place.  Living environment; residence and Firearm Safety: Lives w/ wife and their dog and 3 cats. His sister currently lives in [place], [place], but may be moving in with them temporarily while she recovers from a prolonged hospitalization. 2-story house, no firearms.   Counseling:   Eye Exam- Dr. Claudean Kinds yearly. Dental- Follows w/ Dr. Andrew Au regularly. Pt reports he has periodontal disease.   Male:   CCS- last within the last year. No polyps per patient.    PSA- Not on file. Follows w/ Dr. Tedd Sias, urology at the [facility].    Objective:    Today's Vitals   08/12/16 1049  BP: (!) 146/80  Pulse: 65  SpO2: 99%  Weight: 187 lb (84.8 kg)  Height: $Remove'5\' 6"'iLcPWFP$  (1.676 m)   Body mass index is 30.18 kg/m.  BP  Readings from Last 3 Encounters:  08/12/16 (!) 146/80  07/26/16 134/80  04/28/16 128/78   Wt Readings from Last 3 Encounters:  08/12/16 187 lb (84.8 kg)  07/26/16 191 lb (86.6 kg)  04/28/16 188 lb 4 oz (85.4 kg)   Current Medications (verified) Outpatient Encounter Prescriptions as of 08/12/2016  Medication Sig  . aspirin 81 MG tablet Take 81 mg by mouth daily.  . calcitRIOL (ROCALTROL) 0.25 MCG capsule Take 0.25 mcg by mouth 3 (three) times a week. Monday, Wednesday & Friday   . calcium carbonate (TUMS - DOSED IN MG ELEMENTAL CALCIUM) 500 MG chewable tablet Chew 1 tablet by mouth 3 (three) times daily with meals.   . Cholecalciferol (VITAMIN D3) 1000 units CAPS Take 1 capsule by mouth daily.  . furosemide (LASIX) 40 MG tablet Take 2 tablets in the morning and 1 in the afternoon  . glipiZIDE (GLUCOTROL) 10 MG tablet Take 1 tablet (10 mg total) by mouth 3 (three) times daily. (Patient taking differently: Take 5 mg by mouth 2 (two) times daily before a meal. )  . lisinopril (PRINIVIL,ZESTRIL) 40 MG tablet Take 1 tablet (40 mg total) by mouth daily.  . Multiple Vitamin (MULTIVITAMIN) tablet Take 1 tablet by mouth daily.  Marland Kitchen NIFEdipine (ADALAT CC) 90 MG 24 hr tablet Take 1 tablet (90 mg total) by mouth daily.  . pioglitazone (ACTOS) 30 MG tablet Take 1 tablet (30 mg total) by mouth daily.  Marland Kitchen  sertraline (ZOLOFT) 100 MG tablet Take 1 tablet (100 mg total) by mouth daily.  . simvastatin (ZOCOR) 40 MG tablet Take 1 tablet (40 mg total) by mouth daily.  . vitamin C (ASCORBIC ACID) 500 MG tablet Take 500 mg by mouth daily.   No facility-administered encounter medications on file as of 08/12/2016.     Allergies (verified) Patient has no known allergies.   History: Past Medical History:  Diagnosis Date  . Allergy   . Asthma   . Chronic kidney disease   . Depression   . Diabetes mellitus   . Hepatitis C   . Hypertension    Past Surgical History:  Procedure Laterality Date  . NEPHRECTOMY      partial - benign tumor  . PARTIAL NEPHRECTOMY  10/08/2011   Left kidney   Family History  Problem Relation Age of Onset  . Diabetes Mother   . Hypertension Mother   . COPD Mother   . Asthma Mother   . Diabetes Father   . Diabetes Sister   . Diabetes Brother   . Kidney disease Brother    Social History   Occupational History  . Not on file.   Social History Main Topics  . Smoking status: Former Smoker    Packs/day: 1.00    Years: 11.00    Types: Cigarettes, Pipe    Quit date: 08/07/1974  . Smokeless tobacco: Never Used  . Alcohol use 0.6 oz/week    1 Cans of beer per week     Comment: 1 glass wine every few weeks.  . Drug use: Yes    Types: Marijuana     Comment: Marijuana cookie/candy 1x monthly  . Sexual activity: Yes    Birth control/ protection: None   Tobacco Counseling Counseling given: Not Answered   Activities of Daily Living In your present state of health, do you have any difficulty performing the following activities: 08/12/2016  Hearing? Y  Vision? N  Difficulty concentrating or making decisions? N  Walking or climbing stairs? N  Dressing or bathing? N  Doing errands, shopping? N  Preparing Food and eating ? N  Using the Toilet? N  In the past six months, have you accidently leaked urine? N  Do you have problems with loss of bowel control? N  Managing your Medications? N  Managing your Finances? N  Housekeeping or managing your Housekeeping? N  Some recent data might be hidden    Immunizations and Health Maintenance  There is no immunization history on file for this patient. Health Maintenance Due  Topic Date Due  . PNA vac Low Risk Adult (1 of 2 - PCV13) 03/10/2010  . OPHTHALMOLOGY EXAM  12/23/2015    Patient Care Team: Darreld Mclean, MD as PCP - General (Family Medicine) Ralene Bathe, MD as Consulting Physician (Ophthalmology) Fleet Contras, MD as Consulting Physician (Nephrology)  Indicate any recent Medical Services  you may have received from other than [facility] providers in the past year (date may be approximate).    Assessment:   This is a routine wellness examination for Scott Jones. Physical assessment deferred to PCP.  Hearing/Vision screen Hearing Screening Comments: Able to hear conversational tones w/o difficulty. Wears hearing aids bilaterally from the [place]. He does typically wear hearing aids, but is not wearing them today.  Vision Screening Comments: Wears bilateral contacts. Follows w/ Dr. Claudean Kinds. Is planning to have laser eye surgery in the near future. Reports he does have some "angular closure glaucoma" recently diagnosed.  Dietary issues and exercise activities discussed: Current Exercise Habits: Home exercise routine, Type of exercise: walking  Diet (meal preparation, eat out, water intake, caffeinated beverages, dairy products, fruits and vegetables): in general, a "healthy" diet  , well balanced. Renal diet. Substitutes coconut milk in coffee.      Goals    . Increase physical activity      Depression Screen PHQ 2/9 Scores 08/12/2016 12/25/2015 12/16/2014  PHQ - 2 Score 0 0 0    Fall Risk Fall Risk  08/12/2016 12/25/2015  Falls in the past year? Yes No  Number falls in past yr: 1 -  Injury with Fall? No -  Follow up Falls prevention discussed -    Cognitive Function: MMSE - Mini Mental State Exam 08/12/2016  Orientation to time 5  Orientation to Place 5  Registration 3  Attention/ Calculation 5  Recall 3  Language- name 2 objects 2  Language- repeat 1  Language- follow 3 step command 3  Language- read & follow direction 1  Write a sentence 1  Copy design 1  Total score 30        Screening Tests Health Maintenance  Topic Date Due  . PNA vac Low Risk Adult (1 of 2 - PCV13) 03/10/2010  . OPHTHALMOLOGY EXAM  12/23/2015  . HEMOGLOBIN A1C  01/26/2017  . FOOT EXAM  07/26/2017  . TETANUS/TDAP  05/02/2021  . COLONOSCOPY  09/29/2025  . INFLUENZA VACCINE  Addressed  . Hepatitis C  Screening  Completed        Plan:    Follow-up w/ PCP as scheduled. Follow-up sooner if any continued episodes of hypoglycemia.  Bring a copy of your advance directives to your next office visit.  Please bring a copy of your immunization record and colonoscopy report to your next visit.  During the course of the visit Tino was educated and counseled about the following appropriate screening and preventive services:   Vaccines to include Pneumoccal, Influenza, Hepatitis B, Td, HCV  Colorectal cancer screening  Cardiovascular disease screening  Diabetes screening  Glaucoma screening  Nutrition counseling  Prostate cancer screening  Patient Instructions (the written plan) were given to the patient.   Dorrene German, RN   08/12/2016    I have reviewed the above note by Ms. Ermalinda Barrios an agree with her documentation  Reviewed and agreed with evaluation. But did send note to RN asking her advise pt check sugars in am fasting for one week and check if feels poorly(possible hypoglycemic symptoms). My chart [place] readings in one week or call us so we can confirm sugar levels not to low.  [place], Percell Miller, PA-C

## 2016-08-12 ENCOUNTER — Encounter: Payer: Self-pay | Admitting: *Deleted

## 2016-08-12 ENCOUNTER — Ambulatory Visit (INDEPENDENT_AMBULATORY_CARE_PROVIDER_SITE_OTHER): Payer: PPO | Admitting: *Deleted

## 2016-08-12 VITALS — BP 146/80 | HR 65 | Ht 66.0 in | Wt 187.0 lb

## 2016-08-12 DIAGNOSIS — Z0000 Encounter for general adult medical examination without abnormal findings: Secondary | ICD-10-CM | POA: Diagnosis not present

## 2016-08-12 NOTE — Patient Instructions (Addendum)
[name] , Thank you for taking time to come for your Medicare Wellness Visit. I appreciate your ongoing commitment to your health goals. Please review the following plan we discussed and let me know if I can assist you in the future.   Bring a copy of your advance directives to your next office visit. Please bring a copy of your immunization record and colonoscopy report to your next visit.  These are the goals we discussed: Goals    . Increase physical activity       This is a list of the screening recommended for you and due dates:  Health Maintenance  Topic Date Due  . Pneumonia vaccines (1 of 2 - PCV13) 03/10/2010  . Eye exam for diabetics  12/23/2015  . Hemoglobin A1C  01/26/2017  . Complete foot exam   07/26/2017  . Tetanus Vaccine  05/02/2021  . Colon Cancer Screening  09/29/2025  . Flu Shot  Addressed  .  Hepatitis C: One time screening is recommended by Center for Disease Control  (CDC) for  adults born from [birth year] through 1966.   Completed

## 2016-08-17 NOTE — Telephone Encounter (Signed)
Staff message sent to provider regarding below.  Patient was instructed at time of visit to continue checking CBG as directed by PCP and to notify PCP if any continued episodes of hypoglycemia.

## 2016-08-17 NOTE — Telephone Encounter (Signed)
I thought that was probably reason. I was just wondering if was guideline rule or because of the low sugar. Again not problem that I reviewed. Just wanted to know since it is rare that I review someone else wellness exam. Again not a problem. Thanks for your help.  Kasmira Cacioppo, Percell Miller, PA-C

## 2016-08-17 NOTE — Telephone Encounter (Signed)
Wanted to know why this wellness sent to me. Dr. Edilia Bo pt is pcp. Then reviewed the note and saw she was having hypoglycemia at times. Is this why you sent if not not a problem for me to review. But in light of her low sugars would ask she check sugars daily in am. Document readings and call us or my chart [place] the results. Also ask her to check sugars if feeling symptomatic. Light headed, dizzy weak etc.

## 2016-08-17 NOTE — Telephone Encounter (Signed)
Thanks

## 2016-08-18 DIAGNOSIS — H40031 Anatomical narrow angle, right eye: Secondary | ICD-10-CM | POA: Diagnosis not present

## 2016-08-18 DIAGNOSIS — H40033 Anatomical narrow angle, bilateral: Secondary | ICD-10-CM | POA: Diagnosis not present

## 2016-08-18 DIAGNOSIS — H26491 Other secondary cataract, right eye: Secondary | ICD-10-CM | POA: Diagnosis not present

## 2016-08-25 DIAGNOSIS — H40032 Anatomical narrow angle, left eye: Secondary | ICD-10-CM | POA: Diagnosis not present

## 2016-08-25 DIAGNOSIS — H40033 Anatomical narrow angle, bilateral: Secondary | ICD-10-CM | POA: Diagnosis not present

## 2016-09-09 ENCOUNTER — Telehealth: Payer: Self-pay | Admitting: Family Medicine

## 2016-09-09 ENCOUNTER — Encounter: Payer: Self-pay | Admitting: Family Medicine

## 2016-09-09 NOTE — Telephone Encounter (Signed)
Received his records from the [place]- will abstract for him

## 2016-09-10 DIAGNOSIS — H43811 Vitreous degeneration, right eye: Secondary | ICD-10-CM | POA: Diagnosis not present

## 2016-09-10 DIAGNOSIS — H01001 Unspecified blepharitis right upper eyelid: Secondary | ICD-10-CM | POA: Diagnosis not present

## 2016-09-10 DIAGNOSIS — H47021 Hemorrhage in optic nerve sheath, right eye: Secondary | ICD-10-CM | POA: Diagnosis not present

## 2016-09-11 ENCOUNTER — Encounter: Payer: Self-pay | Admitting: Family Medicine

## 2016-09-16 DIAGNOSIS — N2581 Secondary hyperparathyroidism of renal origin: Secondary | ICD-10-CM | POA: Diagnosis not present

## 2016-09-16 DIAGNOSIS — I129 Hypertensive chronic kidney disease with stage 1 through stage 4 chronic kidney disease, or unspecified chronic kidney disease: Secondary | ICD-10-CM | POA: Diagnosis not present

## 2016-09-16 DIAGNOSIS — Z6830 Body mass index (BMI) 30.0-30.9, adult: Secondary | ICD-10-CM | POA: Diagnosis not present

## 2016-09-16 DIAGNOSIS — N184 Chronic kidney disease, stage 4 (severe): Secondary | ICD-10-CM | POA: Diagnosis not present

## 2016-09-16 DIAGNOSIS — D631 Anemia in chronic kidney disease: Secondary | ICD-10-CM | POA: Diagnosis not present

## 2016-09-16 DIAGNOSIS — E1122 Type 2 diabetes mellitus with diabetic chronic kidney disease: Secondary | ICD-10-CM | POA: Diagnosis not present

## 2016-09-17 DIAGNOSIS — D631 Anemia in chronic kidney disease: Secondary | ICD-10-CM | POA: Diagnosis not present

## 2016-09-21 DIAGNOSIS — H43811 Vitreous degeneration, right eye: Secondary | ICD-10-CM | POA: Diagnosis not present

## 2016-09-27 ENCOUNTER — Other Ambulatory Visit: Payer: Self-pay | Admitting: Nephrology

## 2016-09-27 DIAGNOSIS — N184 Chronic kidney disease, stage 4 (severe): Secondary | ICD-10-CM

## 2016-09-29 NOTE — Discharge Instructions (Signed)

## 2016-10-01 ENCOUNTER — Other Ambulatory Visit: Payer: PPO

## 2016-10-01 ENCOUNTER — Inpatient Hospital Stay (HOSPITAL_COMMUNITY)
Admission: RE | Admit: 2016-10-01 | Discharge: 2016-10-01 | Disposition: A | Discharge status: Home / Self Care | Payer: PPO | Source: Ambulatory Visit | Attending: Nephrology | Admitting: Nephrology

## 2016-10-02 ENCOUNTER — Encounter: Payer: Self-pay | Admitting: Family Medicine

## 2016-10-13 ENCOUNTER — Ambulatory Visit (INDEPENDENT_AMBULATORY_CARE_PROVIDER_SITE_OTHER): Payer: PPO | Admitting: Internal Medicine

## 2016-10-13 ENCOUNTER — Other Ambulatory Visit (INDEPENDENT_AMBULATORY_CARE_PROVIDER_SITE_OTHER): Payer: PPO

## 2016-10-13 ENCOUNTER — Encounter: Payer: Self-pay | Admitting: Internal Medicine

## 2016-10-13 DIAGNOSIS — Z794 Long term (current) use of insulin: Secondary | ICD-10-CM

## 2016-10-13 DIAGNOSIS — M109 Gout, unspecified: Secondary | ICD-10-CM

## 2016-10-13 DIAGNOSIS — N183 Chronic kidney disease, stage 3 unspecified: Secondary | ICD-10-CM

## 2016-10-13 DIAGNOSIS — E1122 Type 2 diabetes mellitus with diabetic chronic kidney disease: Secondary | ICD-10-CM | POA: Diagnosis not present

## 2016-10-13 DIAGNOSIS — N184 Chronic kidney disease, stage 4 (severe): Secondary | ICD-10-CM

## 2016-10-13 LAB — URIC ACID: URIC ACID, SERUM: 9.1 mg/dL — AB (ref 4.0–7.8)

## 2016-10-13 MED ORDER — ALLOPURINOL 100 MG PO TABS: 100.0000 mg | ORAL_TABLET | Freq: Every day | ORAL | 3 refills | Status: DC

## 2016-10-13 MED ORDER — METHYLPREDNISOLONE ACETATE 80 MG/ML IJ SUSP
80.0000 mg | Freq: Once | INTRAMUSCULAR | Status: AC
  Administered 2016-10-13: 80 mg via INTRAMUSCULAR

## 2016-10-13 MED ORDER — PREDNISONE 10 MG PO TABS: 10.0000 mg | ORAL_TABLET | Freq: Every day | ORAL | 0 refills | Status: AC

## 2016-10-13 MED ORDER — HYDROCODONE-ACETAMINOPHEN 5-325 MG PO TABS: 1.0000 | ORAL_TABLET | Freq: Four times a day (QID) | ORAL | 0 refills | Status: DC | PRN

## 2016-10-13 NOTE — Assessment & Plan Note (Signed)
To cont f/u with renal as planned

## 2016-10-13 NOTE — Patient Instructions (Signed)
You had the steroid shot today  Please take all new medication as prescribed - the prednisone, and pain medication only if needed  Please take all new medication as prescribed - the allopurinol only after the swelling and pain are improved  Please continue all other medications as before, and refills have been done if requested.  Please have the pharmacy call with any other refills you may need.  Please keep your appointments with your specialists as you may have planned  Please go to the LAB in the Basement (turn left off the elevator) for the tests to be done today - just the uric acid level today  You will be contacted by phone if any changes need to be made immediately.  Otherwise, you will receive a letter about your results with an explanation, but please check with MyChart first.  Please remember to sign up for MyChart if you have not done so, as this will be important to you in the future with finding out test results, communicating by private email, and scheduling acute appointments online when needed.

## 2016-10-13 NOTE — Progress Notes (Signed)
Subjective:    Patient ID: Scott Jones, male    DOB: 1945-08-18, 71 y.o.   MRN: [id number]  HPI  Here with acute visit for sudden recent onset right first MTP red/tender/swelling similar to a milder episode a year ago tx with short course of prn NSAID as acute gout.  Does not recall any other episodes or uric acid levels done.  Has hx of CKD and followed closely per renal, with some worsening overall in the past yr per pt.  Seemed to start suddenly just on arrival on plane back from [place] last evening.  Had been there with wife visiting family, admits to higher protein diet while there.  Has some swelling overall to the legs as well right > left after all night in a plane in sitting position.  No fever or trauma.  Has not heard of allopurinol preventive.  Pt denies polydipsia, polyuria, or low sugar and CBG's in lower 100.s but has not checked in 2 days.  Pt states overall good compliance with meds, trying to follow lower cholesterol, diabetic diet, at home  andwt overall stable Wt Readings from Last 3 Encounters:  10/13/16 183 lb (83 kg)  08/12/16 187 lb (84.8 kg)  07/26/16 191 lb (86.6 kg)      Past Medical History:  Diagnosis Date  . Allergy   . Asthma   . Chronic kidney disease   . Depression   . Diabetes mellitus   . Hepatitis C   . Hypertension    Past Surgical History:  Procedure Laterality Date  . NEPHRECTOMY     partial - benign tumor  . PARTIAL NEPHRECTOMY  10/08/2011   Left kidney    reports that he quit smoking about 42 years ago. His smoking use included Cigarettes and Pipe. He has a 11.00 pack-year smoking history. He has never used smokeless tobacco. He reports that he drinks about 0.6 oz of alcohol per week . He reports that he uses drugs, including Marijuana. family history includes Asthma in his mother; COPD in his mother; Diabetes in his brother, father, mother, and sister; Hypertension in his mother; Kidney disease in his brother. No Known Allergies Current  Outpatient Prescriptions on File Prior to Visit  Medication Sig Dispense Refill  . aspirin 81 MG tablet Take 81 mg by mouth daily.    . calcitRIOL (ROCALTROL) 0.25 MCG capsule Take 0.25 mcg by mouth 3 (three) times a week. Monday, Wednesday & Friday     . calcium carbonate (TUMS - DOSED IN MG ELEMENTAL CALCIUM) 500 MG chewable tablet Chew 1 tablet by mouth 3 (three) times daily with meals.     . Cholecalciferol (VITAMIN D3) 1000 units CAPS Take 1 capsule by mouth daily.    . furosemide (LASIX) 40 MG tablet Take 2 tablets in the morning and 1 in the afternoon 1 tablet 3  . glipiZIDE (GLUCOTROL) 10 MG tablet Take 1 tablet (10 mg total) by mouth 3 (three) times daily. (Patient taking differently: Take 5 mg by mouth 2 (two) times daily before a meal. ) 270 tablet 3  . lisinopril (PRINIVIL,ZESTRIL) 40 MG tablet Take 1 tablet (40 mg total) by mouth daily. 90 tablet 3  . Multiple Vitamin (MULTIVITAMIN) tablet Take 1 tablet by mouth daily.    Marland Kitchen NIFEdipine (ADALAT CC) 90 MG 24 hr tablet Take 1 tablet (90 mg total) by mouth daily. 90 tablet 3  . pioglitazone (ACTOS) 30 MG tablet Take 1 tablet (30 mg total) by mouth  daily. 90 tablet 3  . sertraline (ZOLOFT) 100 MG tablet Take 1 tablet (100 mg total) by mouth daily. 90 tablet 3  . simvastatin (ZOCOR) 40 MG tablet Take 1 tablet (40 mg total) by mouth daily. 90 tablet 3  . vitamin C (ASCORBIC ACID) 500 MG tablet Take 500 mg by mouth daily.     No current facility-administered medications on file prior to visit.    Review of Systems  Constitutional: Negative for other unusual diaphoresis or sweats HENT: Negative for ear discharge or swelling Eyes: Negative for other worsening visual disturbances Respiratory: Negative for stridor or other swelling  Gastrointestinal: Negative for worsening distension or other blood Genitourinary: Negative for retention or other urinary change Musculoskeletal: Negative for other MSK pain or swelling Skin: Negative for color  change or other new lesions Neurological: Negative for worsening tremors and other numbness  Psychiatric/Behavioral: Negative for worsening agitation or other fatigue All other system neg per pt    Objective:   Physical Exam BP (!) 146/90   Pulse 67   Ht $R'5\' 6"'or$  (1.676 m)   Wt 183 lb (83 kg)   SpO2 100%   BMI 29.54 kg/m  VS noted,  Constitutional: Pt appears in NAD HENT: Head: NCAT.  Right Ear: External ear normal.  Left Ear: External ear normal.  Eyes: . Pupils are equal, round, and reactive to light. Conjunctivae and EOM are normal Nose: without d/c or deformity Neck: Neck supple. Gross normal ROM Cardiovascular: Normal rate and regular rhythm.   Pulmonary/Chest: Effort normal and breath sounds without rales or wheezing.  Right first MTP with 2-3+ red/tender/swelling, Bilat LE with 1-1+ edema right > left (some of which has been chronic in past) Neurological: Pt is alert. At baseline orientation, motor grossly intact Skin: Skin is warm. No rashes, other new lesions, no LE edema Psychiatric: Pt behavior is normal without agitation  No other exam findings Lab Results  Component Value Date   WBC 9.7 08/07/2015   HGB 12.2 (A) 08/07/2015   HCT 34 (A) 08/07/2015   PLT 232 08/07/2015   GLUCOSE 123 (H) 07/26/2016   CHOL 131 07/26/2016   TRIG 137.0 07/26/2016   HDL 38.90 (L) 07/26/2016   LDLDIRECT 82 05/17/2013   LDLCALC 65 07/26/2016   ALT 22 08/07/2015   AST 23 08/07/2015   NA 143 07/26/2016   K 3.9 07/26/2016   CL 102 07/26/2016   CREATININE 3.89 (H) 07/26/2016   BUN 80 (H) 07/26/2016   CO2 29 07/26/2016   TSH 1.91 08/07/2015   HGBA1C 5.3 07/26/2016   MICROALBUR 45.3 (H) 12/25/2015       Assessment & Plan:

## 2016-10-13 NOTE — Assessment & Plan Note (Addendum)
Mod to severe with limping with walking; walks out today without shoes due to pain; for depomedrol IM 80, prednisone asd, hydrocodone prn pain, check uric acid, and add allopurinol 100 qd due to high risk renal pt for recurrence, and low purine diet

## 2016-10-13 NOTE — Assessment & Plan Note (Signed)
stable overall by history and exam, recent data reviewed with pt, and pt to continue medical treatment as before,  to f/u any worsening symptoms or concerns;  Lab Results  Component Value Date   HGBA1C 5.3 07/26/2016   Pt to call for onset polys or cbg > 200 with steroid tx

## 2016-10-14 ENCOUNTER — Ambulatory Visit
Admission: RE | Admit: 2016-10-14 | Discharge: 2016-10-14 | Disposition: A | Discharge status: Home / Self Care | Payer: PPO | Source: Ambulatory Visit | Attending: Nephrology | Admitting: Nephrology

## 2016-10-14 DIAGNOSIS — N184 Chronic kidney disease, stage 4 (severe): Secondary | ICD-10-CM | POA: Diagnosis not present

## 2016-10-19 DIAGNOSIS — N184 Chronic kidney disease, stage 4 (severe): Secondary | ICD-10-CM | POA: Diagnosis not present

## 2016-11-04 DIAGNOSIS — H47021 Hemorrhage in optic nerve sheath, right eye: Secondary | ICD-10-CM | POA: Diagnosis not present

## 2016-11-11 DIAGNOSIS — Z6830 Body mass index (BMI) 30.0-30.9, adult: Secondary | ICD-10-CM | POA: Diagnosis not present

## 2016-11-11 DIAGNOSIS — D631 Anemia in chronic kidney disease: Secondary | ICD-10-CM | POA: Diagnosis not present

## 2016-11-11 DIAGNOSIS — E1122 Type 2 diabetes mellitus with diabetic chronic kidney disease: Secondary | ICD-10-CM | POA: Diagnosis not present

## 2016-11-11 DIAGNOSIS — N2581 Secondary hyperparathyroidism of renal origin: Secondary | ICD-10-CM | POA: Diagnosis not present

## 2016-11-11 DIAGNOSIS — N184 Chronic kidney disease, stage 4 (severe): Secondary | ICD-10-CM | POA: Diagnosis not present

## 2016-11-11 DIAGNOSIS — I129 Hypertensive chronic kidney disease with stage 1 through stage 4 chronic kidney disease, or unspecified chronic kidney disease: Secondary | ICD-10-CM | POA: Diagnosis not present

## 2016-11-16 ENCOUNTER — Ambulatory Visit (HOSPITAL_COMMUNITY)
Admission: RE | Admit: 2016-11-16 | Discharge: 2016-11-16 | Disposition: A | Discharge status: Home / Self Care | Payer: PPO | Source: Ambulatory Visit | Attending: Nephrology | Admitting: Nephrology

## 2016-11-16 DIAGNOSIS — N184 Chronic kidney disease, stage 4 (severe): Secondary | ICD-10-CM | POA: Insufficient documentation

## 2016-11-16 DIAGNOSIS — D631 Anemia in chronic kidney disease: Secondary | ICD-10-CM | POA: Insufficient documentation

## 2016-11-16 LAB — POCT HEMOGLOBIN-HEMACUE: Hemoglobin: 9.4 g/dL — ABNORMAL LOW (ref 13.0–17.0)

## 2016-11-16 MED ORDER — DARBEPOETIN ALFA 100 MCG/0.5ML IJ SOSY
100.0000 ug | PREFILLED_SYRINGE | INTRAMUSCULAR | Status: DC
  Administered 2016-11-16: 12:00:00 100 ug via SUBCUTANEOUS

## 2016-11-16 MED ORDER — DARBEPOETIN ALFA 100 MCG/0.5ML IJ SOSY
PREFILLED_SYRINGE | INTRAMUSCULAR | Status: AC
Start: 2016-11-16 — End: 2016-11-16
  Filled 2016-11-16: qty 0.5

## 2016-11-16 NOTE — Discharge Instructions (Signed)

## 2016-11-30 ENCOUNTER — Encounter (HOSPITAL_COMMUNITY)
Admission: RE | Admit: 2016-11-30 | Discharge: 2016-11-30 | Disposition: A | Discharge status: Home / Self Care | Payer: PPO | Source: Ambulatory Visit | Attending: Nephrology | Admitting: Nephrology

## 2016-11-30 DIAGNOSIS — N184 Chronic kidney disease, stage 4 (severe): Secondary | ICD-10-CM | POA: Insufficient documentation

## 2016-11-30 DIAGNOSIS — D631 Anemia in chronic kidney disease: Secondary | ICD-10-CM | POA: Insufficient documentation

## 2016-11-30 LAB — POCT HEMOGLOBIN-HEMACUE: Hemoglobin: 11.1 g/dL — ABNORMAL LOW (ref 13.0–17.0)

## 2016-11-30 MED ORDER — DARBEPOETIN ALFA 100 MCG/0.5ML IJ SOSY
PREFILLED_SYRINGE | INTRAMUSCULAR | Status: AC
  Filled 2016-11-30: qty 0.5

## 2016-11-30 MED ORDER — DARBEPOETIN ALFA 100 MCG/0.5ML IJ SOSY
100.0000 ug | PREFILLED_SYRINGE | INTRAMUSCULAR | Status: DC
  Administered 2016-11-30: 100 ug via SUBCUTANEOUS

## 2016-11-30 NOTE — Progress Notes (Signed)
[facility] [address], [place], [place] [address] [phone]  Date:  12/01/2016   Name:  Scott Jones   DOB:  15-Dec-1945   MRN:  [id number]  PCP:  Darreld Mclean, MD    Chief Complaint: Annual Exam (Pt here for CPE. )   History of Present Illness:  Scott Jones is a 71 y.o. very pleasant male patient who presents with the following:  Last seen by myself about 4 months ago, here today for a CPE.  History of renal failure, DM, HTN, dyslipidemia and gout Has seen Dr. Mercy Moore with [facility].  Last notes we have are from 05-14-2016, but most recent visit was about 10 days ago, should receive this note soon.  He stated that Dr. Mercy Moore started on Calcitriol on MWF.  Now taking TUMS with every meal.  Has limited phosphorus intake.  Does pay attention to sodium & potassium intake.  Still urinating ok.  Not drinking fluids as much.  Patient states that he is feeling OK.    Is taking Glipizide BID and Actos daily.  Feels like he is having some hypoglycemia.  States that if he take Glipizide with dinner, he feels better.  If he takes it after he eats he will more frequently feel like he is hypoglycemia  CBG as low as 47 about 3 weeks.  Reports having hypoglycemic episodes approximately two times per week.  Typically brings some form of sugar with him, for hypoglycemic events.  He has not had any syncope or other severe SE from hypoglycemia  He is starting preliminary discussion of a kidney transplant.  His DM has been under very good control Last cholesterol check in March, looked very good Lab Results  Component Value Date   HGBA1C 5.3 07/26/2016   He is also getting treatment for his anemia with an Aranesp injection every 14 days - this is helping rebuild his red blood counts.  He feels much better since his Hg is higher and is very pleased with this   Lab Results  Component Value Date   WBC 9.7  08/07/2015   HGB 11.1 (L) 11/30/2016   HCT 34 (A) 08/07/2015   PLT 232 08/07/2015   He will likely need to start dialysis at some point  They are going to place a fistula in his arm- this can be done by the [place]. He hopes to get a home hemodialysis machine when the time comes He plans to get his eventual transplant through [facility]; he is awaiting his consultations with them His nephrologist is Dr. Mercy Moore His hemoglobin has really improved with his Aranesp injections and his energy level is much improved   He is on Actos once a day and glipizide as needed- he takes 5 mg once or twice a day He is on lasix 40 mg, lisinopril 40  We will get a BMP for him - his creat is currently running upper 3's to 4's  He has been under more stress and wonders about increasing his sertraline to 200 mg- this is fine, will change his rx for him  He went on a trip to [place] a few months ago and had a great time, and they are planning to visit [place] in September for a family wedding   Patient Active Problem List   Diagnosis Date Noted  . Acute gouty arthritis 10/13/2016  . H/O partial nephrectomy 02/16/2016  . Depression (  emotion) 12/28/2015  . Allergic rhinitis 12/28/2015  . History of hepatitis C 12/28/2015  . Dyslipidemia 12/28/2015  . Chronic kidney disease (CKD), stage IV (severe) ([place]) 12/28/2015  . DM (diabetes mellitus) ([place]) 02/02/2012  . HTN (hypertension) 02/02/2012    Past Medical History:  Diagnosis Date  . Allergy   . Asthma   . Chronic kidney disease   . Depression   . Diabetes mellitus   . Hepatitis C   . Hypertension     Past Surgical History:  Procedure Laterality Date  . NEPHRECTOMY     partial - benign tumor  . PARTIAL NEPHRECTOMY  10/08/2011   Left kidney    Social History  Substance Use Topics  . Smoking status: Former Smoker    Packs/day: 1.00    Years: 11.00    Types: Cigarettes, Pipe    Quit date: 08/07/1974  . Smokeless tobacco: Never Used  . Alcohol  use 0.6 oz/week    1 Cans of beer per week     Comment: 1 glass wine every few weeks.    Family History  Problem Relation Age of Onset  . Diabetes Mother   . Hypertension Mother   . COPD Mother   . Asthma Mother   . Diabetes Father   . Diabetes Sister   . Diabetes Brother   . Kidney disease Brother     No Known Allergies  Medication list has been reviewed and updated.  Current Outpatient Prescriptions on File Prior to Visit  Medication Sig Dispense Refill  . allopurinol (ZYLOPRIM) 100 MG tablet Take 1 tablet (100 mg total) by mouth daily. 90 tablet 3  . aspirin 81 MG tablet Take 81 mg by mouth daily.    . calcitRIOL (ROCALTROL) 0.25 MCG capsule Take 0.25 mcg by mouth 3 (three) times a week. Monday, Wednesday & Friday     . calcium carbonate (TUMS - DOSED IN MG ELEMENTAL CALCIUM) 500 MG chewable tablet Chew 1 tablet by mouth 3 (three) times daily with meals.     . Cholecalciferol (VITAMIN D3) 1000 units CAPS Take 1 capsule by mouth daily.    . furosemide (LASIX) 40 MG tablet Take 2 tablets in the morning and 1 in the afternoon 1 tablet 3  . glipiZIDE (GLUCOTROL) 10 MG tablet Take 1 tablet (10 mg total) by mouth 3 (three) times daily. (Patient taking differently: Take 5 mg by mouth 2 (two) times daily before a meal. ) 270 tablet 3  . HYDROcodone-acetaminophen (NORCO/VICODIN) 5-325 MG tablet Take 1 tablet by mouth every 6 (six) hours as needed for moderate pain. 30 tablet 0  . lisinopril (PRINIVIL,ZESTRIL) 40 MG tablet Take 1 tablet (40 mg total) by mouth daily. 90 tablet 3  . Multiple Vitamin (MULTIVITAMIN) tablet Take 1 tablet by mouth daily.    Marland Kitchen NIFEdipine (ADALAT CC) 90 MG 24 hr tablet Take 1 tablet (90 mg total) by mouth daily. 90 tablet 3  . pioglitazone (ACTOS) 30 MG tablet Take 1 tablet (30 mg total) by mouth daily. 90 tablet 3  . simvastatin (ZOCOR) 40 MG tablet Take 1 tablet (40 mg total) by mouth daily. 90 tablet 3  . vitamin C (ASCORBIC ACID) 500 MG tablet Take 500 mg by  mouth daily.     No current facility-administered medications on file prior to visit.     Review of Systems:  As per HPI- otherwise negative. Cured of hep C 20- years ago   Physical Examination: Vitals:   12/01/16 1040  BP: 138/63  Pulse: 74  Temp: 98.1 F (36.7 C)   Vitals:   12/01/16 1040  Weight: 188 lb (85.3 kg)  Height: $Remove'5\' 6"'ossFaXR$  (1.676 m)   Body mass index is 30.34 kg/m. Ideal Body Weight: Weight in (lb) to have BMI = 25: 154.6  GEN: WDWN, NAD, Non-toxic, A & O x 3, overweight, looks well, color is improved HEENT: Atraumatic, Normocephalic. Neck supple. No masses, No LAD. Ears and Nose: No external deformity. CV: RRR, No M/G/R. No JVD. No thrill. No extra heart sounds. PULM: CTA B, no wheezes, crackles, rhonchi. No retractions. No resp. distress. No accessory muscle use. EXTR: No c/c/e NEURO Normal gait.  PSYCH: Normally interactive. Conversant. Not depressed or anxious appearing.  Calm demeanor.    Assessment and Plan: Controlled type 2 diabetes mellitus with stage 4 chronic kidney disease, without long-term current use of insulin ([place]) - Plan: Hemoglobin A1c  Dyslipidemia  Hypoglycemia associated with diabetes ([place])  Essential hypertension - Plan: Comprehensive metabolic panel  Adjustment disorder with mixed anxiety and depressed mood - Plan: sertraline (ZOLOFT) 100 MG tablet  Anemia due to end stage renal disease ([place])  Here today for a recheck visit His renal disease is reaching the point of dialysis as bridge to transplant.  He is seeing Mercy Moore at [facility], and [facility] for transplant services Will check on his A1c today He is feeling much better with his hg a bit higher Will plan further follow- up pending labs. Plan to have him see me in about 6 months as he is busy seeing lots of doctors right now  Signed Lamar Blinks, MD

## 2016-12-01 ENCOUNTER — Telehealth: Payer: Self-pay | Admitting: *Deleted

## 2016-12-01 ENCOUNTER — Ambulatory Visit: Payer: PPO | Admitting: Family Medicine

## 2016-12-01 ENCOUNTER — Encounter: Payer: Self-pay | Admitting: Family Medicine

## 2016-12-01 ENCOUNTER — Ambulatory Visit (INDEPENDENT_AMBULATORY_CARE_PROVIDER_SITE_OTHER): Payer: PPO | Admitting: Family Medicine

## 2016-12-01 VITALS — BP 138/63 | HR 74 | Temp 98.1°F | Ht 66.0 in | Wt 188.0 lb

## 2016-12-01 DIAGNOSIS — F4323 Adjustment disorder with mixed anxiety and depressed mood: Secondary | ICD-10-CM | POA: Diagnosis not present

## 2016-12-01 DIAGNOSIS — E1122 Type 2 diabetes mellitus with diabetic chronic kidney disease: Secondary | ICD-10-CM

## 2016-12-01 DIAGNOSIS — E11649 Type 2 diabetes mellitus with hypoglycemia without coma: Secondary | ICD-10-CM

## 2016-12-01 DIAGNOSIS — N186 End stage renal disease: Secondary | ICD-10-CM | POA: Diagnosis not present

## 2016-12-01 DIAGNOSIS — N184 Chronic kidney disease, stage 4 (severe): Secondary | ICD-10-CM

## 2016-12-01 DIAGNOSIS — I10 Essential (primary) hypertension: Secondary | ICD-10-CM | POA: Diagnosis not present

## 2016-12-01 DIAGNOSIS — E785 Hyperlipidemia, unspecified: Secondary | ICD-10-CM | POA: Diagnosis not present

## 2016-12-01 DIAGNOSIS — D631 Anemia in chronic kidney disease: Secondary | ICD-10-CM | POA: Diagnosis not present

## 2016-12-01 LAB — HEMOGLOBIN A1C: HEMOGLOBIN A1C: 5.7 % (ref 4.6–6.5)

## 2016-12-01 LAB — COMPREHENSIVE METABOLIC PANEL
ALBUMIN: 4.2 g/dL (ref 3.5–5.2)
ALT: 13 U/L (ref 0–53)
AST: 17 U/L (ref 0–37)
Alkaline Phosphatase: 46 U/L (ref 39–117)
BILIRUBIN TOTAL: 0.3 mg/dL (ref 0.2–1.2)
BUN: 84 mg/dL — AB (ref 6–23)
CHLORIDE: 99 meq/L (ref 96–112)
CO2: 29 mEq/L (ref 19–32)
CREATININE: 4.58 mg/dL — AB (ref 0.40–1.50)
Calcium: 9.8 mg/dL (ref 8.4–10.5)
GFR: 13.49 mL/min — CL (ref >60.00)
Glucose, Bld: 307 mg/dL — ABNORMAL HIGH (ref 70–99)
Potassium: 4.4 mEq/L (ref 3.5–5.1)
Sodium: 139 mEq/L (ref 135–145)
Total Protein: 7.7 g/dL (ref 6.0–8.3)

## 2016-12-01 MED ORDER — SERTRALINE HCL 100 MG PO TABS: 200.0000 mg | ORAL_TABLET | Freq: Every day | ORAL | 3 refills | Status: DC

## 2016-12-01 NOTE — Telephone Encounter (Signed)
Called [facility] and spoke to [facility], Environmental consultant for Dr. Mercy Moore.  Olie's most recent creat with them was just under 4.5, so no significant change.  Will fax report to Dr. Jerilynn Mages

## 2016-12-01 NOTE — Telephone Encounter (Signed)
CRITICAL VALUE STICKER  CRITICAL VALUE:Creat:4.58,GFR:13.49  RECEIVER (on-site recipient of call):Angie  DATE & TIME NOTIFIED:12/01/16 @ 3:18pm  MESSENGER (representative from lab):[place]  MD NOTIFIED:Dr. Copland  TIME OF NOTIFICATION:3:42pm  RESPONSE:Send message to the provider.

## 2016-12-01 NOTE — Patient Instructions (Addendum)
The fax number you need is [place]  It was great to see you feeling a bit better today- I'm glad that the medication to boost you red cells is working!  I'll be in touch with your labs asap

## 2016-12-02 ENCOUNTER — Encounter: Payer: Self-pay | Admitting: Family Medicine

## 2016-12-04 ENCOUNTER — Encounter: Payer: Self-pay | Admitting: Family Medicine

## 2016-12-06 ENCOUNTER — Other Ambulatory Visit: Payer: Self-pay | Admitting: Family Medicine

## 2016-12-06 DIAGNOSIS — F4323 Adjustment disorder with mixed anxiety and depressed mood: Secondary | ICD-10-CM

## 2016-12-13 ENCOUNTER — Other Ambulatory Visit (HOSPITAL_COMMUNITY): Payer: Self-pay | Admitting: *Deleted

## 2016-12-14 ENCOUNTER — Encounter (HOSPITAL_COMMUNITY)
Admission: RE | Admit: 2016-12-14 | Discharge: 2016-12-14 | Disposition: A | Discharge status: Home / Self Care | Payer: PPO | Source: Ambulatory Visit | Attending: Nephrology | Admitting: Nephrology

## 2016-12-14 DIAGNOSIS — D631 Anemia in chronic kidney disease: Secondary | ICD-10-CM | POA: Diagnosis not present

## 2016-12-14 DIAGNOSIS — N184 Chronic kidney disease, stage 4 (severe): Secondary | ICD-10-CM

## 2016-12-14 LAB — FERRITIN: FERRITIN: 30 ng/mL (ref 24–336)

## 2016-12-14 LAB — IRON AND TIBC
IRON: 61 ug/dL (ref 45–182)
Saturation Ratios: 15 % — ABNORMAL LOW (ref 17.9–39.5)
TIBC: 407 ug/dL (ref 250–450)
UIBC: 346 ug/dL

## 2016-12-14 LAB — POCT HEMOGLOBIN-HEMACUE: HEMOGLOBIN: 10.8 g/dL — AB (ref 13.0–17.0)

## 2016-12-14 MED ORDER — DARBEPOETIN ALFA 100 MCG/0.5ML IJ SOSY
PREFILLED_SYRINGE | INTRAMUSCULAR | Status: AC
  Administered 2016-12-14: 100 ug via SUBCUTANEOUS
  Filled 2016-12-14: qty 0.5

## 2016-12-14 MED ORDER — DARBEPOETIN ALFA 100 MCG/0.5ML IJ SOSY
100.0000 ug | PREFILLED_SYRINGE | INTRAMUSCULAR | Status: DC
  Administered 2016-12-14: 100 ug via SUBCUTANEOUS

## 2016-12-28 ENCOUNTER — Encounter (HOSPITAL_COMMUNITY)
Admission: RE | Admit: 2016-12-28 | Discharge: 2016-12-28 | Disposition: A | Discharge status: Home / Self Care | Payer: PPO | Source: Ambulatory Visit | Attending: Nephrology | Admitting: Nephrology

## 2016-12-28 DIAGNOSIS — D631 Anemia in chronic kidney disease: Secondary | ICD-10-CM | POA: Insufficient documentation

## 2016-12-28 DIAGNOSIS — N184 Chronic kidney disease, stage 4 (severe): Secondary | ICD-10-CM | POA: Insufficient documentation

## 2016-12-28 LAB — POCT HEMOGLOBIN-HEMACUE: Hemoglobin: 11.9 g/dL — ABNORMAL LOW (ref 13.0–17.0)

## 2016-12-28 MED ORDER — DARBEPOETIN ALFA 100 MCG/0.5ML IJ SOSY
100.0000 ug | PREFILLED_SYRINGE | INTRAMUSCULAR | Status: DC
  Administered 2016-12-28: 11:00:00 100 ug via SUBCUTANEOUS

## 2016-12-28 MED ORDER — DARBEPOETIN ALFA 100 MCG/0.5ML IJ SOSY
PREFILLED_SYRINGE | INTRAMUSCULAR | Status: AC
  Filled 2016-12-28: qty 0.5

## 2017-01-07 ENCOUNTER — Other Ambulatory Visit: Payer: Self-pay | Admitting: Family Medicine

## 2017-01-07 DIAGNOSIS — E785 Hyperlipidemia, unspecified: Secondary | ICD-10-CM

## 2017-01-11 ENCOUNTER — Encounter (HOSPITAL_COMMUNITY)
Admission: RE | Admit: 2017-01-11 | Discharge: 2017-01-11 | Disposition: A | Discharge status: Home / Self Care | Payer: PPO | Source: Ambulatory Visit | Attending: Nephrology | Admitting: Nephrology

## 2017-01-11 DIAGNOSIS — N184 Chronic kidney disease, stage 4 (severe): Secondary | ICD-10-CM

## 2017-01-11 DIAGNOSIS — D631 Anemia in chronic kidney disease: Secondary | ICD-10-CM | POA: Diagnosis not present

## 2017-01-11 LAB — POCT HEMOGLOBIN-HEMACUE: Hemoglobin: 12.2 g/dL — ABNORMAL LOW (ref 13.0–17.0)

## 2017-01-11 LAB — IRON AND TIBC
IRON: 64 ug/dL (ref 45–182)
Saturation Ratios: 16 % — ABNORMAL LOW (ref 17.9–39.5)
TIBC: 407 ug/dL (ref 250–450)
UIBC: 343 ug/dL

## 2017-01-11 LAB — FERRITIN: FERRITIN: 28 ng/mL (ref 24–336)

## 2017-01-11 MED ORDER — DARBEPOETIN ALFA 100 MCG/0.5ML IJ SOSY: 100.0000 ug | PREFILLED_SYRINGE | INTRAMUSCULAR | Status: DC

## 2017-01-14 ENCOUNTER — Other Ambulatory Visit: Payer: Self-pay | Admitting: Family Medicine

## 2017-01-14 DIAGNOSIS — E1122 Type 2 diabetes mellitus with diabetic chronic kidney disease: Secondary | ICD-10-CM

## 2017-01-14 DIAGNOSIS — I10 Essential (primary) hypertension: Secondary | ICD-10-CM

## 2017-01-14 DIAGNOSIS — N184 Chronic kidney disease, stage 4 (severe): Secondary | ICD-10-CM

## 2017-01-16 ENCOUNTER — Other Ambulatory Visit: Payer: Self-pay | Admitting: Family Medicine

## 2017-01-16 DIAGNOSIS — I10 Essential (primary) hypertension: Secondary | ICD-10-CM

## 2017-01-21 DIAGNOSIS — D631 Anemia in chronic kidney disease: Secondary | ICD-10-CM | POA: Diagnosis not present

## 2017-01-21 DIAGNOSIS — I129 Hypertensive chronic kidney disease with stage 1 through stage 4 chronic kidney disease, or unspecified chronic kidney disease: Secondary | ICD-10-CM | POA: Diagnosis not present

## 2017-01-21 DIAGNOSIS — N184 Chronic kidney disease, stage 4 (severe): Secondary | ICD-10-CM | POA: Diagnosis not present

## 2017-01-21 DIAGNOSIS — N2581 Secondary hyperparathyroidism of renal origin: Secondary | ICD-10-CM | POA: Diagnosis not present

## 2017-01-21 DIAGNOSIS — Z6830 Body mass index (BMI) 30.0-30.9, adult: Secondary | ICD-10-CM | POA: Diagnosis not present

## 2017-01-21 DIAGNOSIS — E1122 Type 2 diabetes mellitus with diabetic chronic kidney disease: Secondary | ICD-10-CM | POA: Diagnosis not present

## 2017-01-25 ENCOUNTER — Other Ambulatory Visit (HOSPITAL_COMMUNITY): Payer: Self-pay | Admitting: *Deleted

## 2017-01-26 ENCOUNTER — Encounter (HOSPITAL_COMMUNITY)
Admission: RE | Admit: 2017-01-26 | Discharge: 2017-01-26 | Disposition: A | Discharge status: Home / Self Care | Payer: PPO | Source: Ambulatory Visit | Attending: Nephrology | Admitting: Nephrology

## 2017-01-26 DIAGNOSIS — Z5181 Encounter for therapeutic drug level monitoring: Secondary | ICD-10-CM | POA: Diagnosis not present

## 2017-01-26 DIAGNOSIS — Z79899 Other long term (current) drug therapy: Secondary | ICD-10-CM | POA: Insufficient documentation

## 2017-01-26 DIAGNOSIS — D631 Anemia in chronic kidney disease: Secondary | ICD-10-CM | POA: Diagnosis not present

## 2017-01-26 DIAGNOSIS — N184 Chronic kidney disease, stage 4 (severe): Secondary | ICD-10-CM | POA: Insufficient documentation

## 2017-01-26 LAB — POCT HEMOGLOBIN-HEMACUE: HEMOGLOBIN: 11.8 g/dL — AB (ref 13.0–17.0)

## 2017-01-26 MED ORDER — FERUMOXYTOL INJECTION 510 MG/17 ML
510.0000 mg | INTRAVENOUS | Status: DC
  Administered 2017-01-26: 11:00:00 510 mg via INTRAVENOUS
  Filled 2017-01-26: qty 17

## 2017-01-26 MED ORDER — DARBEPOETIN ALFA 100 MCG/0.5ML IJ SOSY
PREFILLED_SYRINGE | INTRAMUSCULAR | Status: AC
  Filled 2017-01-26: qty 0.5

## 2017-01-26 MED ORDER — DARBEPOETIN ALFA 100 MCG/0.5ML IJ SOSY
100.0000 ug | PREFILLED_SYRINGE | INTRAMUSCULAR | Status: DC
  Administered 2017-01-26: 100 ug via SUBCUTANEOUS

## 2017-02-07 DIAGNOSIS — H04123 Dry eye syndrome of bilateral lacrimal glands: Secondary | ICD-10-CM | POA: Diagnosis not present

## 2017-02-22 ENCOUNTER — Other Ambulatory Visit (HOSPITAL_COMMUNITY): Payer: Self-pay | Admitting: *Deleted

## 2017-02-23 ENCOUNTER — Encounter (HOSPITAL_COMMUNITY)
Admission: RE | Admit: 2017-02-23 | Discharge: 2017-02-23 | Disposition: A | Discharge status: Home / Self Care | Payer: PPO | Source: Ambulatory Visit | Attending: Nephrology | Admitting: Nephrology

## 2017-02-23 DIAGNOSIS — N184 Chronic kidney disease, stage 4 (severe): Secondary | ICD-10-CM | POA: Diagnosis not present

## 2017-02-23 LAB — POCT HEMOGLOBIN-HEMACUE: Hemoglobin: 10.7 g/dL — ABNORMAL LOW (ref 13.0–17.0)

## 2017-02-23 MED ORDER — DARBEPOETIN ALFA 100 MCG/0.5ML IJ SOSY
PREFILLED_SYRINGE | INTRAMUSCULAR | Status: AC
  Administered 2017-02-23: 100 ug via SUBCUTANEOUS
  Filled 2017-02-23: qty 0.5

## 2017-02-23 MED ORDER — SODIUM CHLORIDE 0.9 % IV SOLN
510.0000 mg | INTRAVENOUS | Status: AC
  Administered 2017-02-23: 10:00:00 510 mg via INTRAVENOUS
  Filled 2017-02-23: qty 17

## 2017-02-23 MED ORDER — DARBEPOETIN ALFA 100 MCG/0.5ML IJ SOSY
100.0000 ug | PREFILLED_SYRINGE | INTRAMUSCULAR | Status: DC
  Administered 2017-02-23: 100 ug via SUBCUTANEOUS

## 2017-03-14 DIAGNOSIS — N184 Chronic kidney disease, stage 4 (severe): Secondary | ICD-10-CM | POA: Diagnosis not present

## 2017-03-20 ENCOUNTER — Emergency Department (HOSPITAL_COMMUNITY): Payer: PPO

## 2017-03-20 ENCOUNTER — Encounter (HOSPITAL_COMMUNITY): Payer: Self-pay | Admitting: *Deleted

## 2017-03-20 ENCOUNTER — Emergency Department (HOSPITAL_COMMUNITY)
Admission: EM | Admit: 2017-03-20 | Discharge: 2017-03-20 | Disposition: A | Discharge status: Home / Self Care | Payer: PPO | Attending: Emergency Medicine | Admitting: Emergency Medicine

## 2017-03-20 DIAGNOSIS — Y9384 Activity, sleeping: Secondary | ICD-10-CM | POA: Insufficient documentation

## 2017-03-20 DIAGNOSIS — R220 Localized swelling, mass and lump, head: Secondary | ICD-10-CM | POA: Diagnosis not present

## 2017-03-20 DIAGNOSIS — S0990XA Unspecified injury of head, initial encounter: Secondary | ICD-10-CM | POA: Insufficient documentation

## 2017-03-20 DIAGNOSIS — E1122 Type 2 diabetes mellitus with diabetic chronic kidney disease: Secondary | ICD-10-CM | POA: Diagnosis not present

## 2017-03-20 DIAGNOSIS — Z7982 Long term (current) use of aspirin: Secondary | ICD-10-CM | POA: Diagnosis not present

## 2017-03-20 DIAGNOSIS — W06XXXA Fall from bed, initial encounter: Secondary | ICD-10-CM | POA: Diagnosis not present

## 2017-03-20 DIAGNOSIS — Y92003 Bedroom of unspecified non-institutional (private) residence as the place of occurrence of the external cause: Secondary | ICD-10-CM | POA: Insufficient documentation

## 2017-03-20 DIAGNOSIS — Z87891 Personal history of nicotine dependence: Secondary | ICD-10-CM | POA: Diagnosis not present

## 2017-03-20 DIAGNOSIS — J45909 Unspecified asthma, uncomplicated: Secondary | ICD-10-CM | POA: Insufficient documentation

## 2017-03-20 DIAGNOSIS — M546 Pain in thoracic spine: Secondary | ICD-10-CM | POA: Diagnosis not present

## 2017-03-20 DIAGNOSIS — I129 Hypertensive chronic kidney disease with stage 1 through stage 4 chronic kidney disease, or unspecified chronic kidney disease: Secondary | ICD-10-CM | POA: Diagnosis not present

## 2017-03-20 DIAGNOSIS — N184 Chronic kidney disease, stage 4 (severe): Secondary | ICD-10-CM | POA: Diagnosis not present

## 2017-03-20 DIAGNOSIS — Z79899 Other long term (current) drug therapy: Secondary | ICD-10-CM | POA: Insufficient documentation

## 2017-03-20 DIAGNOSIS — Y999 Unspecified external cause status: Secondary | ICD-10-CM | POA: Diagnosis not present

## 2017-03-20 MED ORDER — OXYCODONE HCL 5 MG PO TABS
5.0000 mg | ORAL_TABLET | Freq: Once | ORAL | Status: AC
  Administered 2017-03-20: 5 mg via ORAL
  Filled 2017-03-20: qty 1

## 2017-03-20 MED ORDER — DIAZEPAM 2 MG PO TABS
2.0000 mg | ORAL_TABLET | Freq: Once | ORAL | Status: AC
  Administered 2017-03-20: 2 mg via ORAL
  Filled 2017-03-20: qty 1

## 2017-03-20 MED ORDER — ACETAMINOPHEN 500 MG PO TABS
1000.0000 mg | ORAL_TABLET | Freq: Once | ORAL | Status: AC
  Administered 2017-03-20: 1000 mg via ORAL
  Filled 2017-03-20: qty 2

## 2017-03-20 MED ORDER — DIAZEPAM 2 MG PO TABS: 2.0000 mg | ORAL_TABLET | Freq: Two times a day (BID) | ORAL | 0 refills | Status: DC

## 2017-03-20 NOTE — ED Provider Notes (Signed)
[facility] EMERGENCY DEPARTMENT Provider Note   CSN: [id number] Arrival date & time: 03/20/17  4098     History   Chief Complaint Chief Complaint  Patient presents with  . Back Pain    HPI Scott Jones is a 71 y.o. male.  71 yo M with a chief complaint of right-sided thoracic back pain.  This happened when he was playing golf.  He felt a catch when he swung his club.  Since then has been applying BenGay and taking Tylenol with no relief.  Pain is worse with twisting palpation.  He denies fevers denies urinary symptoms.  Today he was trying to get out of bed and with the worsening of his back pain he lost his balance and struck the back of his head.  Denied loss of consciousness.  Denies blood thinner use.  He had a hematoma with mild bleeding to the back of his head.  Family was concerned and brought him to the ED.   The history is provided by the patient and the spouse.  Back Pain   This is a new problem. The current episode started more than 1 week ago. The problem occurs constantly. The problem has not changed since onset.The pain is associated with twisting. The pain is present in the thoracic spine. The quality of the pain is described as stabbing and shooting. The pain does not radiate. The pain is at a severity of 7/10. The pain is moderate. The symptoms are aggravated by bending, twisting and certain positions. The pain is worse during the night. Associated symptoms include headaches. Pertinent negatives include no chest pain, no fever and no abdominal pain. He has tried ice, heat and analgesics for the symptoms. The treatment provided no relief. Risk factors include obesity.    Past Medical History:  Diagnosis Date  . Allergy   . Asthma   . Chronic kidney disease   . Depression   . Diabetes mellitus   . Hepatitis C   . Hypertension     Patient Active Problem List   Diagnosis Date Noted  . Acute gouty arthritis 10/13/2016  . H/O partial  nephrectomy 02/16/2016  . Depression (emotion) 12/28/2015  . Allergic rhinitis 12/28/2015  . History of hepatitis C 12/28/2015  . Dyslipidemia 12/28/2015  . Chronic kidney disease (CKD), stage IV (severe) ([place]) 12/28/2015  . DM (diabetes mellitus) ([place]) 02/02/2012  . HTN (hypertension) 02/02/2012    Past Surgical History:  Procedure Laterality Date  . NEPHRECTOMY     partial - benign tumor  . PARTIAL NEPHRECTOMY  10/08/2011   Left kidney       Home Medications    Prior to Admission medications   Medication Sig Start Date End Date Taking? Authorizing Provider  allopurinol (ZYLOPRIM) 100 MG tablet Take 1 tablet (100 mg total) by mouth daily. 10/13/16  Yes Biagio Borg, MD  aspirin 81 MG tablet Take 81 mg by mouth daily.   Yes [provider]  calcitRIOL (ROCALTROL) 0.25 MCG capsule Take 0.25 mcg by mouth 3 (three) times a week. Monday, Wednesday & Friday    Yes [provider]  calcium carbonate (TUMS - DOSED IN MG ELEMENTAL CALCIUM) 500 MG chewable tablet Chew 1 tablet by mouth 3 (three) times daily with meals.    Yes [provider]  Cholecalciferol (VITAMIN D3) 1000 units CAPS Take 1 capsule by mouth daily.   Yes [provider]  furosemide (LASIX) 40 MG tablet Take 2 tablets  in the morning and 1 in the afternoon 07/26/16  Yes Copland, Gay Filler, MD  HYDROcodone-acetaminophen (NORCO/VICODIN) 5-325 MG tablet Take 1 tablet by mouth every 6 (six) hours as needed for moderate pain. 10/13/16  Yes Biagio Borg, MD  lisinopril (PRINIVIL,ZESTRIL) 40 MG tablet Take 1 tablet (40 mg total) by mouth daily. 01/17/17  Yes Copland, Gay Filler, MD  Multiple Vitamin (MULTIVITAMIN) tablet Take 1 tablet by mouth daily.   Yes [provider]  NIFEdipine (ADALAT CC) 90 MG 24 hr tablet Take 1 tablet (90 mg total) by mouth daily. 01/14/17  Yes Copland, Gay Filler, MD  pioglitazone (ACTOS) 30 MG tablet Take 1 tablet (30 mg total) by mouth daily. 01/14/17  Yes Copland,  Gay Filler, MD  sertraline (ZOLOFT) 100 MG tablet Take 2 tablets (200 mg total) by mouth daily. 12/01/16  Yes Copland, Gay Filler, MD  simvastatin (ZOCOR) 40 MG tablet Take 1 tablet (40 mg total) by mouth daily. 01/10/17  Yes Copland, Gay Filler, MD  vitamin C (ASCORBIC ACID) 500 MG tablet Take 500 mg by mouth daily.   Yes [provider]  vitamin E 400 UNIT capsule Take 400 Units by mouth daily.   Yes [provider]  diazepam (VALIUM) 2 MG tablet Take 1 tablet (2 mg total) by mouth 2 (two) times daily. 03/20/17   Deno Etienne, DO    Family History Family History  Problem Relation Age of Onset  . Diabetes Mother   . Hypertension Mother   . COPD Mother   . Asthma Mother   . Diabetes Father   . Diabetes Sister   . Diabetes Brother   . Kidney disease Brother     Social History Social History  Substance Use Topics  . Smoking status: Former Smoker    Packs/day: 1.00    Years: 11.00    Types: Cigarettes, Pipe    Quit date: 08/07/1974  . Smokeless tobacco: Never Used  . Alcohol use 0.6 oz/week    1 Cans of beer per week     Comment: 1 glass wine every few weeks.     Allergies   Patient has no known allergies.   Review of Systems Review of Systems  Constitutional: Negative for chills and fever.  HENT: Negative for congestion and facial swelling.   Eyes: Negative for discharge and visual disturbance.  Respiratory: Negative for shortness of breath.   Cardiovascular: Negative for chest pain and palpitations.  Gastrointestinal: Negative for abdominal pain, diarrhea and vomiting.  Musculoskeletal: Positive for back pain. Negative for arthralgias and myalgias.  Skin: Negative for color change and rash.  Neurological: Positive for headaches. Negative for tremors and syncope.  Psychiatric/Behavioral: Negative for confusion and dysphoric mood.     Physical Exam Updated Vital Signs BP (!) 144/72   Pulse 70   Temp 97.6 F (36.4 C) (Oral)   Resp 18   SpO2 98%    Physical Exam  Constitutional: He is oriented to person, place, and time. He appears well-developed and well-nourished.  HENT:  Head: Normocephalic and atraumatic.  Occipital hematoma  Eyes: Pupils are equal, round, and reactive to light. EOM are normal.  Neck: Normal range of motion. Neck supple. No JVD present.  Cardiovascular: Normal rate and regular rhythm.  Exam reveals no gallop and no friction rub.   No murmur heard. Pulmonary/Chest: No respiratory distress. He has no wheezes.  Abdominal: He exhibits no distension and no mass. There is no tenderness. There is no rebound and no  guarding.  Musculoskeletal: Normal range of motion. He exhibits tenderness.  Tenderness to palpation of the right lateral paraspinal musculature about T11-12.  Pulse motor and sensation is intact to bilateral lower extremities.  No clonus reflexes are normal  Neurological: He is alert and oriented to person, place, and time.  Skin: No rash noted. No pallor.  Psychiatric: He has a normal mood and affect. His behavior is normal.  Nursing note and vitals reviewed.    ED Treatments / Results  Labs (all labs ordered are listed, but only abnormal results are displayed) Labs Reviewed - No data to display  EKG  EKG Interpretation None       Radiology Ct Head Wo Contrast  Result Date: 03/20/2017 CLINICAL DATA:  Status post fall while getting into chair. Hit back of head on dresser. Initial encounter. EXAM: CT HEAD WITHOUT CONTRAST TECHNIQUE: Contiguous axial images were obtained from the base of the skull through the vertex without intravenous contrast. COMPARISON:  None. FINDINGS: Brain: No evidence of acute infarction, hemorrhage, hydrocephalus, extra-axial collection or mass lesion/mass effect. Prominence of the ventricles and sulci reflects moderate cortical volume loss. Cerebellar atrophy is noted. The brainstem and fourth ventricle are within normal limits. The basal ganglia are unremarkable in  appearance. The cerebral hemispheres demonstrate grossly normal gray-white differentiation. No mass effect or midline shift is seen. Vascular: No hyperdense vessel or unexpected calcification. Skull: There is no evidence of fracture; visualized osseous structures are unremarkable in appearance. Sinuses/Orbits: The orbits are within normal limits. The paranasal sinuses and mastoid air cells are well-aerated. Other: Mild soft tissue swelling is noted at the right occiput. IMPRESSION: 1. No evidence of traumatic intracranial injury or fracture. 2. Mild soft tissue swelling at the right occiput. 3. Moderate cortical volume loss noted. Electronically Signed   By: Garald Balding M.D.   On: 03/20/2017 06:49    Procedures Procedures (including critical care time)  Medications Ordered in ED Medications  acetaminophen (TYLENOL) tablet 1,000 mg (1,000 mg Oral Given 03/20/17 0648)  diazepam (VALIUM) tablet 2 mg (2 mg Oral Given 03/20/17 7564)  oxyCODONE (Oxy IR/ROXICODONE) immediate release tablet 5 mg (5 mg Oral Given 03/20/17 3329)     Initial Impression / Assessment and Plan / ED Course  I have reviewed the triage vital signs and the nursing notes.  Pertinent labs & imaging results that were available during my care of the patient were reviewed by me and considered in my medical decision making (see chart for details).     71 yo M with a chief complaint of back pain and headache.  Headache is traumatic and occurred just prior to arrival.  Will obtain a CT of the head.  Patient's back pain is been going on for the past week and sounds musculoskeletal in nature.  Will treat symptomatically.  Have him follow-up with his PCP. CT negative. D/c home.   7:47 AM:  I have discussed the diagnosis/risks/treatment options with the patient and family and believe the pt to be eligible for discharge home to follow-up with PCP. We also discussed returning to the ED immediately if new or worsening sx occur. We  discussed the sx which are most concerning (e.g., sudden worsening pain, fever, inability to tolerate by mouth) that necessitate immediate return. Medications administered to the patient during their visit and any new prescriptions provided to the patient are listed below.  Medications given during this visit Medications  acetaminophen (TYLENOL) tablet 1,000 mg (1,000 mg Oral Given 03/20/17 0648)  diazepam (VALIUM) tablet 2 mg (2 mg Oral Given 03/20/17 8682)  oxyCODONE (Oxy IR/ROXICODONE) immediate release tablet 5 mg (5 mg Oral Given 03/20/17 5749)     The patient appears reasonably screen and/or stabilized for discharge and I doubt any other medical condition or other [facility] requiring further screening, evaluation, or treatment in the ED at this time prior to discharge.   Final Clinical Impressions(s) / ED Diagnoses   Final diagnoses:  Injury of head, initial encounter  Acute right-sided thoracic back pain    New Prescriptions Discharge Medication List as of 03/20/2017  6:56 AM    START taking these medications   Details  diazepam (VALIUM) 2 MG tablet Take 1 tablet (2 mg total) by mouth 2 (two) times daily., Starting Sun 03/20/2017, [place], Linna Hoff, DO 03/20/17 [phone]

## 2017-03-20 NOTE — ED Triage Notes (Signed)
Pt c/o lower R sided back pain for the past week. Reports playing golf a week ago and has been having lower back pain since. Pt has tried [employer], oxycontin, and hydrocodone without relief. Pt also reports falling tonight after missing the chair he thought he was going to sit in, and hit his head on a wooden bed. Denies LOC

## 2017-03-20 NOTE — ED Notes (Signed)
Pt presents with back pain s/p fall; his back was hurting so he was walking around the house.  He went to sit down in a chair and missed the chair.  Hit back of head and his butt.

## 2017-03-20 NOTE — Discharge Instructions (Addendum)
Take tylenol $RemoveBefo'1000mg'fDwJsChoOEj$ (2 extra strength) four times a day.  Take the muscle relaxer at night to help you with the pain and to help you sleep

## 2017-03-23 ENCOUNTER — Encounter (HOSPITAL_COMMUNITY)
Admission: RE | Admit: 2017-03-23 | Discharge: 2017-03-23 | Disposition: A | Discharge status: Home / Self Care | Payer: PPO | Source: Ambulatory Visit | Attending: Nephrology | Admitting: Nephrology

## 2017-03-23 DIAGNOSIS — N184 Chronic kidney disease, stage 4 (severe): Secondary | ICD-10-CM | POA: Diagnosis not present

## 2017-03-23 LAB — IRON AND TIBC
Iron: 144 ug/dL (ref 45–182)
Saturation Ratios: 41 % — ABNORMAL HIGH (ref 17.9–39.5)
TIBC: 350 ug/dL (ref 250–450)
UIBC: 206 ug/dL

## 2017-03-23 LAB — FERRITIN: FERRITIN: 398 ng/mL — AB (ref 24–336)

## 2017-03-23 LAB — POCT HEMOGLOBIN-HEMACUE: Hemoglobin: 11 g/dL — ABNORMAL LOW (ref 13.0–17.0)

## 2017-03-23 MED ORDER — DARBEPOETIN ALFA 100 MCG/0.5ML IJ SOSY
PREFILLED_SYRINGE | INTRAMUSCULAR | Status: AC
  Administered 2017-03-23: 100 ug via SUBCUTANEOUS
  Filled 2017-03-23: qty 0.5

## 2017-03-23 MED ORDER — DARBEPOETIN ALFA 100 MCG/0.5ML IJ SOSY
100.0000 ug | PREFILLED_SYRINGE | INTRAMUSCULAR | Status: DC
  Administered 2017-03-23: 100 ug via SUBCUTANEOUS

## 2017-03-24 DIAGNOSIS — I129 Hypertensive chronic kidney disease with stage 1 through stage 4 chronic kidney disease, or unspecified chronic kidney disease: Secondary | ICD-10-CM | POA: Diagnosis not present

## 2017-03-24 DIAGNOSIS — D631 Anemia in chronic kidney disease: Secondary | ICD-10-CM | POA: Diagnosis not present

## 2017-03-24 DIAGNOSIS — N2581 Secondary hyperparathyroidism of renal origin: Secondary | ICD-10-CM | POA: Diagnosis not present

## 2017-03-24 DIAGNOSIS — N184 Chronic kidney disease, stage 4 (severe): Secondary | ICD-10-CM | POA: Diagnosis not present

## 2017-03-25 NOTE — Progress Notes (Signed)
[facility] at [facility] [address] [place], [place] [address] [phone]  Date:  03/28/2017   Name:  Scott Jones   DOB:  [birth year]   MRN:  [id number]  PCP:  Darreld Mclean, MD    Chief Complaint: Back Pain (lower pain )   History of Present Illness:  Scott Jones is a 71 y.o. very pleasant male patient who presents with the following:  Follow-up visit today- history of DM, CRF Last seen here by myself in July:  He will likely need to start dialysis at some point  They are going to place a fistula in his arm- this can be done by the [place]. He hopes to get a home hemodialysis machine when the time comes He plans to get his eventual transplant through [facility]; he is awaiting his consultations with them His nephrologist is Dr. Mercy Moore His hemoglobin has really improved with his Aranesp injections and his energy level is much improved  He is on Actos once a day and glipizide as needed- he takes 5 mg once or twice a day He is on lasix 40 mg, lisinopril 40 We will get a BMP for him - his creat is currently running upper 3's to 4's He has been under more stress and wonders about increasing his sertraline to 200 mg- this is fine, will change his rx for him  He went on a trip to [place] a few months ago and had a great time, and they are planning to visit [place] in September for a family wedding   He was in the ER about a week ago after he hit his head at home, he also had pulled his right sided mid to lower back while playing a golf a couple of weeks prior.    He had a negative head CT scan in the ER  He ws given some valium for his back, and had a little hydrocodone left at home from previous issues - he has used up the valium, which did help, and is now out of hydrocodone.  His back and ribs are still quite painful/.  He is not able to lie in his bed due to pain but is sleeping in his recliner   His headaches are  now resolved His nephew got married in [place] recently- they went to the wedding and had quite a good time.   We had decreased his DM medication at last visit as his A1c was on the low side and I want to avoid hypoglycemia in this older pt. We had planned to stop his glipizide- however he had started to notice some higher sugars again and is now taking $RemoveBe'5mg'OENlNZlkF$  just once a day  They are also thinking about trying to get a kidney transplant for him . He does have an AV fistula in his left arm in case dialysis becomes necessary in the interim   Right now except for his back hurting he is feeling pretty well No fever or chills No CP or SOB  Lab Results  Component Value Date   HGBA1C 5.7 12/01/2016    Patient Active Problem List   Diagnosis Date Noted  . Acute gouty arthritis 10/13/2016  . H/O partial nephrectomy 02/16/2016  . Depression (emotion) 12/28/2015  . Allergic rhinitis 12/28/2015  . History of hepatitis C 12/28/2015  . Dyslipidemia 12/28/2015  . Chronic kidney disease (CKD), stage IV (severe) ([place]) 12/28/2015  . DM (  diabetes mellitus) ([facility]) 02/02/2012  . HTN (hypertension) 02/02/2012    Past Medical History:  Diagnosis Date  . Allergy   . Asthma   . Chronic kidney disease   . Depression   . Diabetes mellitus   . Hepatitis C   . Hypertension     Past Surgical History:  Procedure Laterality Date  . NEPHRECTOMY     partial - benign tumor  . PARTIAL NEPHRECTOMY  10/08/2011   Left kidney    Social History   Tobacco Use  . Smoking status: Former Smoker    Packs/day: 1.00    Years: 11.00    Pack years: 11.00    Types: Cigarettes, Pipe    Last attempt to quit: 08/07/1974    Years since quitting: 42.6  . Smokeless tobacco: Never Used  Substance Use Topics  . Alcohol use: Yes    Alcohol/week: 0.6 oz    Types: 1 Cans of beer per week    Comment: 1 glass wine every few weeks.  . Drug use: Yes    Types: Marijuana    Comment: Marijuana cookie/candy 1x monthly     Family History  Problem Relation Age of Onset  . Diabetes Mother   . Hypertension Mother   . COPD Mother   . Asthma Mother   . Diabetes Father   . Diabetes Sister   . Diabetes Brother   . Kidney disease Brother     No Known Allergies  Medication list has been reviewed and updated.  Current Outpatient Medications on File Prior to Visit  Medication Sig Dispense Refill  . allopurinol (ZYLOPRIM) 100 MG tablet Take 1 tablet (100 mg total) by mouth daily. 90 tablet 3  . aspirin 81 MG tablet Take 81 mg by mouth daily.    . calcitRIOL (ROCALTROL) 0.25 MCG capsule Take 0.25 mcg by mouth 3 (three) times a week. Monday, Wednesday & Friday     . calcium carbonate (TUMS - DOSED IN MG ELEMENTAL CALCIUM) 500 MG chewable tablet Chew 1 tablet by mouth 3 (three) times daily with meals.     . Cholecalciferol (VITAMIN D3) 1000 units CAPS Take 1 capsule by mouth daily.    . diazepam (VALIUM) 2 MG tablet Take 1 tablet (2 mg total) by mouth 2 (two) times daily. 7 tablet 0  . docusate sodium (COLACE) 100 MG capsule Take 100 mg by mouth.    . furosemide (LASIX) 40 MG tablet Take 2 tablets in the morning and 1 in the afternoon 1 tablet 3  . HYDROcodone-acetaminophen (NORCO/VICODIN) 5-325 MG tablet Take 1 tablet by mouth every 6 (six) hours as needed for moderate pain. 30 tablet 0  . lisinopril (PRINIVIL,ZESTRIL) 40 MG tablet Take 1 tablet (40 mg total) by mouth daily. 90 tablet 0  . Multiple Vitamin (MULTIVITAMIN) tablet Take 1 tablet by mouth daily.    Marland Kitchen NIFEdipine (ADALAT CC) 90 MG 24 hr tablet Take 1 tablet (90 mg total) by mouth daily. 90 tablet 1  . pioglitazone (ACTOS) 30 MG tablet Take 1 tablet (30 mg total) by mouth daily. 90 tablet 1  . sertraline (ZOLOFT) 100 MG tablet Take 2 tablets (200 mg total) by mouth daily. 180 tablet 3  . simvastatin (ZOCOR) 40 MG tablet Take 1 tablet (40 mg total) by mouth daily. 90 tablet 1  . vitamin C (ASCORBIC ACID) 500 MG tablet Take 500 mg by mouth daily.    .  vitamin E 400 UNIT capsule Take 400 Units by mouth daily.  No current facility-administered medications on file prior to visit.     Review of Systems:  As per HPI- otherwise negative. Pain does not radiate down his legs  No numbness or weakness of the legs   Physical Examination: Vitals:   03/28/17 1116  BP: 139/73  Pulse: 67  Resp: 16  Temp: 97.7 F (36.5 C)  SpO2: 100%   Vitals:   03/28/17 1116  Weight: 188 lb 9.6 oz (85.5 kg)   Body mass index is 31.38 kg/m. Ideal Body Weight:    GEN: WDWN, NAD, Non-toxic, A & O x 3, overweight, looks well HEENT: Atraumatic, Normocephalic. Neck supple. No masses, No LAD. Ears and Nose: No external deformity. CV: RRR, No M/G/R. No JVD. No thrill. No extra heart sounds. PULM: CTA B, no wheezes, crackles, rhonchi. No retractions. No resp. distress. No accessory muscle use. ABD: S, NT, ND, +BS. No rebound. No HSM. EXTR: No c/c/e NEURO Normal gait.  PSYCH: Normally interactive. Conversant. Not depressed or anxious appearing.  Calm demeanor.  Right lower back-  He is tender over the right thoracic muscles, posterior.  Possible tenderness of the ribs, but none of the bone spine.   Negative SLR bilaterally Normal strength, sensation and DTR of both LE   Assessment and Plan: Strain of lumbar region, initial encounter - Plan: diazepam (VALIUM) 2 MG tablet, DG Lumbar Spine Complete, CANCELED: DG Ribs Unilateral Left  Muscle spasm - Plan: diazepam (VALIUM) 2 MG tablet  Type 2 diabetes mellitus with stage 3 chronic kidney disease, with long-term current use of insulin ([facility]CC) - Plan: Hemoglobin A1c  Here today to follow-up on his DM and also discuss persistent pain in his right lower back- this seems most likely MSK in origin.  However he is a bit worried about a rib or spine issue, so will get films for him today rx valium for him to use prn for muscle spasm  Signed Lamar Blinks, MD  Received labs and x-rays 11/6  Results for  orders placed or performed in visit on 03/28/17  Hemoglobin A1c  Result Value Ref Range   Hgb A1c MFr Bld 7.4 (H) 4.6 - 6.5 %   Dg Ribs Unilateral Right  Result Date: 03/28/2017 CLINICAL DATA:  Pain in the ribs after golf EXAM: RIGHT RIBS - 2 VIEW COMPARISON:  01/14/2016 FINDINGS: No fracture or other bone lesions are seen involving the ribs. IMPRESSION: Negative. Electronically Signed   By: Donavan Foil M.D.   On: 03/28/2017 22:48   Dg Lumbar Spine Complete  Result Date: 03/28/2017 CLINICAL DATA:  71 year old male with low back pain after playing golf. Initial encounter. EXAM: LUMBAR SPINE - COMPLETE 4+ VIEW COMPARISON:  No comparison lumbar spine exam. Comparison chest x-ray 08/08/2014. FINDINGS: New from the prior chest x-ray and of indeterminate age is a mild T12 superior endplate compression fracture with 15-20% loss of height without retropulsion. Mild L4-5 disc space narrowing. Mild L5-S1 facet degenerative changes. Coils left aspect of the abdomen. Vascular calcifications. IMPRESSION: New from the prior chest x-ray and of indeterminate age is a mild T12 superior endplate compression fracture with 15-20% loss of height without retropulsion. Mild L4-5 disc space narrowing. Mild L5-S1 facet degenerative changes. Aortic Atherosclerosis (ICD10-I70.0). Electronically Signed   By: Genia Del M.D.   On: 03/28/2017 18:26   Ct Head Wo Contrast  Result Date: 03/20/2017 CLINICAL DATA:  Status post fall while getting into chair. Hit back of head on dresser. Initial encounter. EXAM: CT HEAD WITHOUT CONTRAST  TECHNIQUE: Contiguous axial images were obtained from the base of the skull through the vertex without intravenous contrast. COMPARISON:  None. FINDINGS: Brain: No evidence of acute infarction, hemorrhage, hydrocephalus, extra-axial collection or mass lesion/mass effect. Prominence of the ventricles and sulci reflects moderate cortical volume loss. Cerebellar atrophy is noted. The brainstem and  fourth ventricle are within normal limits. The basal ganglia are unremarkable in appearance. The cerebral hemispheres demonstrate grossly normal gray-white differentiation. No mass effect or midline shift is seen. Vascular: No hyperdense vessel or unexpected calcification. Skull: There is no evidence of fracture; visualized osseous structures are unremarkable in appearance. Sinuses/Orbits: The orbits are within normal limits. The paranasal sinuses and mastoid air cells are well-aerated. Other: Mild soft tissue swelling is noted at the right occiput. IMPRESSION: 1. No evidence of traumatic intracranial injury or fracture. 2. Mild soft tissue swelling at the right occiput. 3. Moderate cortical volume loss noted. Electronically Signed   By: Garald Balding M.D.   On: 03/20/2017 06:49

## 2017-03-28 ENCOUNTER — Ambulatory Visit (HOSPITAL_BASED_OUTPATIENT_CLINIC_OR_DEPARTMENT_OTHER)
Admission: RE | Admit: 2017-03-28 | Discharge: 2017-03-28 | Disposition: A | Discharge status: Home / Self Care | Payer: PPO | Source: Ambulatory Visit | Attending: Family Medicine | Admitting: Family Medicine

## 2017-03-28 ENCOUNTER — Other Ambulatory Visit: Payer: Self-pay | Admitting: Family Medicine

## 2017-03-28 ENCOUNTER — Encounter: Payer: Self-pay | Admitting: Family Medicine

## 2017-03-28 ENCOUNTER — Ambulatory Visit: Payer: PPO | Admitting: Family Medicine

## 2017-03-28 VITALS — BP 139/73 | HR 67 | Temp 97.7°F | Resp 16 | Wt 188.6 lb

## 2017-03-28 DIAGNOSIS — S39012A Strain of muscle, fascia and tendon of lower back, initial encounter: Secondary | ICD-10-CM | POA: Diagnosis not present

## 2017-03-28 DIAGNOSIS — R29890 Loss of height: Secondary | ICD-10-CM | POA: Diagnosis not present

## 2017-03-28 DIAGNOSIS — Z01818 Encounter for other preprocedural examination: Secondary | ICD-10-CM | POA: Diagnosis not present

## 2017-03-28 DIAGNOSIS — I351 Nonrheumatic aortic (valve) insufficiency: Secondary | ICD-10-CM | POA: Diagnosis not present

## 2017-03-28 DIAGNOSIS — M4854XA Collapsed vertebra, not elsewhere classified, thoracic region, initial encounter for fracture: Secondary | ICD-10-CM | POA: Insufficient documentation

## 2017-03-28 DIAGNOSIS — M62838 Other muscle spasm: Secondary | ICD-10-CM

## 2017-03-28 DIAGNOSIS — I700 Atherosclerosis of aorta: Secondary | ICD-10-CM | POA: Diagnosis not present

## 2017-03-28 DIAGNOSIS — N183 Chronic kidney disease, stage 3 (moderate): Secondary | ICD-10-CM

## 2017-03-28 DIAGNOSIS — M47897 Other spondylosis, lumbosacral region: Secondary | ICD-10-CM | POA: Insufficient documentation

## 2017-03-28 DIAGNOSIS — R0781 Pleurodynia: Secondary | ICD-10-CM | POA: Diagnosis not present

## 2017-03-28 DIAGNOSIS — Z794 Long term (current) use of insulin: Secondary | ICD-10-CM | POA: Diagnosis not present

## 2017-03-28 DIAGNOSIS — E1122 Type 2 diabetes mellitus with diabetic chronic kidney disease: Secondary | ICD-10-CM

## 2017-03-28 DIAGNOSIS — I071 Rheumatic tricuspid insufficiency: Secondary | ICD-10-CM | POA: Diagnosis not present

## 2017-03-28 DIAGNOSIS — Z5181 Encounter for therapeutic drug level monitoring: Secondary | ICD-10-CM | POA: Diagnosis not present

## 2017-03-28 DIAGNOSIS — M545 Low back pain: Secondary | ICD-10-CM | POA: Diagnosis not present

## 2017-03-28 LAB — HEMOGLOBIN A1C: Hgb A1c MFr Bld: 7.4 % — ABNORMAL HIGH (ref 4.6–6.5)

## 2017-03-28 MED ORDER — DIAZEPAM 2 MG PO TABS: 2.0000 mg | ORAL_TABLET | Freq: Two times a day (BID) | ORAL | 0 refills | Status: DC

## 2017-03-28 MED FILL — diazePAM 2 MG TABS: 2 | 23 days supply | Qty: 45 | Fill #0

## 2017-03-28 NOTE — Patient Instructions (Signed)
I gave you more of the valium to use as needed for muscle spasm and pain- remember this can make you sleepy, and can be habit forming.  Use only as needed Please let me know if your back is not doing better in the next few days We will get some x-rays today as well I'll be in touch with your labs and x-rays, asap

## 2017-03-29 ENCOUNTER — Encounter: Payer: Self-pay | Admitting: Family Medicine

## 2017-04-20 ENCOUNTER — Encounter (HOSPITAL_COMMUNITY): Payer: PPO

## 2017-04-21 ENCOUNTER — Encounter (HOSPITAL_COMMUNITY)
Admission: RE | Admit: 2017-04-21 | Discharge: 2017-04-21 | Disposition: A | Discharge status: Home / Self Care | Payer: PPO | Source: Ambulatory Visit | Attending: Nephrology | Admitting: Nephrology

## 2017-04-21 VITALS — BP 138/66 | HR 71 | Temp 98.1°F | Resp 20

## 2017-04-21 DIAGNOSIS — Z5181 Encounter for therapeutic drug level monitoring: Secondary | ICD-10-CM | POA: Insufficient documentation

## 2017-04-21 DIAGNOSIS — N184 Chronic kidney disease, stage 4 (severe): Secondary | ICD-10-CM | POA: Insufficient documentation

## 2017-04-21 DIAGNOSIS — D631 Anemia in chronic kidney disease: Secondary | ICD-10-CM | POA: Insufficient documentation

## 2017-04-21 DIAGNOSIS — Z79899 Other long term (current) drug therapy: Secondary | ICD-10-CM | POA: Diagnosis not present

## 2017-04-21 LAB — IRON AND TIBC
IRON: 116 ug/dL (ref 45–182)
SATURATION RATIOS: 31 % (ref 17.9–39.5)
TIBC: 371 ug/dL (ref 250–450)
UIBC: 255 ug/dL

## 2017-04-21 LAB — FERRITIN: Ferritin: 251 ng/mL (ref 24–336)

## 2017-04-21 LAB — POCT HEMOGLOBIN-HEMACUE: Hemoglobin: 10.7 g/dL — ABNORMAL LOW (ref 13.0–17.0)

## 2017-04-21 MED ORDER — DARBEPOETIN ALFA 100 MCG/0.5ML IJ SOSY
PREFILLED_SYRINGE | INTRAMUSCULAR | Status: AC
  Administered 2017-04-21: 13:00:00 100 ug via SUBCUTANEOUS
  Filled 2017-04-21: qty 0.5

## 2017-04-21 MED ORDER — DARBEPOETIN ALFA 100 MCG/0.5ML IJ SOSY
100.0000 ug | PREFILLED_SYRINGE | INTRAMUSCULAR | Status: DC
  Administered 2017-04-21: 100 ug via SUBCUTANEOUS

## 2017-04-30 ENCOUNTER — Other Ambulatory Visit: Payer: Self-pay | Admitting: Family Medicine

## 2017-04-30 DIAGNOSIS — I10 Essential (primary) hypertension: Secondary | ICD-10-CM

## 2017-05-04 ENCOUNTER — Encounter: Payer: Self-pay | Admitting: Family Medicine

## 2017-05-04 DIAGNOSIS — Z1159 Encounter for screening for other viral diseases: Secondary | ICD-10-CM | POA: Diagnosis not present

## 2017-05-04 DIAGNOSIS — Z7984 Long term (current) use of oral hypoglycemic drugs: Secondary | ICD-10-CM | POA: Diagnosis not present

## 2017-05-04 DIAGNOSIS — Z905 Acquired absence of kidney: Secondary | ICD-10-CM | POA: Diagnosis not present

## 2017-05-04 DIAGNOSIS — N186 End stage renal disease: Secondary | ICD-10-CM | POA: Diagnosis not present

## 2017-05-04 DIAGNOSIS — I120 Hypertensive chronic kidney disease with stage 5 chronic kidney disease or end stage renal disease: Secondary | ICD-10-CM | POA: Diagnosis not present

## 2017-05-04 DIAGNOSIS — Z125 Encounter for screening for malignant neoplasm of prostate: Secondary | ICD-10-CM | POA: Diagnosis not present

## 2017-05-04 DIAGNOSIS — Z9889 Other specified postprocedural states: Secondary | ICD-10-CM | POA: Diagnosis not present

## 2017-05-04 DIAGNOSIS — Z01818 Encounter for other preprocedural examination: Secondary | ICD-10-CM | POA: Diagnosis not present

## 2017-05-04 DIAGNOSIS — R918 Other nonspecific abnormal finding of lung field: Secondary | ICD-10-CM | POA: Diagnosis not present

## 2017-05-04 DIAGNOSIS — R0602 Shortness of breath: Secondary | ICD-10-CM | POA: Diagnosis not present

## 2017-05-04 DIAGNOSIS — D4412 Neoplasm of uncertain behavior of left adrenal gland: Secondary | ICD-10-CM | POA: Diagnosis not present

## 2017-05-04 DIAGNOSIS — I10 Essential (primary) hypertension: Secondary | ICD-10-CM

## 2017-05-04 DIAGNOSIS — M438X4 Other specified deforming dorsopathies, thoracic region: Secondary | ICD-10-CM | POA: Diagnosis not present

## 2017-05-04 DIAGNOSIS — I491 Atrial premature depolarization: Secondary | ICD-10-CM | POA: Diagnosis not present

## 2017-05-04 DIAGNOSIS — E1122 Type 2 diabetes mellitus with diabetic chronic kidney disease: Secondary | ICD-10-CM | POA: Diagnosis not present

## 2017-05-04 DIAGNOSIS — Z8619 Personal history of other infectious and parasitic diseases: Secondary | ICD-10-CM | POA: Diagnosis not present

## 2017-05-05 MED ORDER — LISINOPRIL 40 MG PO TABS: 40.0000 mg | ORAL_TABLET | Freq: Every day | ORAL | 1 refills | Status: DC

## 2017-05-19 ENCOUNTER — Encounter (HOSPITAL_COMMUNITY)
Admission: RE | Admit: 2017-05-19 | Discharge: 2017-05-19 | Disposition: A | Discharge status: Home / Self Care | Payer: PPO | Source: Ambulatory Visit | Attending: Nephrology | Admitting: Nephrology

## 2017-05-19 VITALS — BP 148/67 | HR 76 | Temp 98.4°F | Resp 20

## 2017-05-19 DIAGNOSIS — N184 Chronic kidney disease, stage 4 (severe): Secondary | ICD-10-CM | POA: Insufficient documentation

## 2017-05-19 DIAGNOSIS — Z5181 Encounter for therapeutic drug level monitoring: Secondary | ICD-10-CM | POA: Diagnosis not present

## 2017-05-19 DIAGNOSIS — D631 Anemia in chronic kidney disease: Secondary | ICD-10-CM | POA: Insufficient documentation

## 2017-05-19 DIAGNOSIS — Z79899 Other long term (current) drug therapy: Secondary | ICD-10-CM | POA: Insufficient documentation

## 2017-05-19 LAB — IRON AND TIBC
IRON: 66 ug/dL (ref 45–182)
Saturation Ratios: 19 % (ref 17.9–39.5)
TIBC: 350 ug/dL (ref 250–450)
UIBC: 284 ug/dL

## 2017-05-19 LAB — POCT HEMOGLOBIN-HEMACUE: Hemoglobin: 8.6 g/dL — ABNORMAL LOW (ref 13.0–17.0)

## 2017-05-19 LAB — FERRITIN: FERRITIN: 213 ng/mL (ref 24–336)

## 2017-05-19 MED ORDER — DARBEPOETIN ALFA 100 MCG/0.5ML IJ SOSY
100.0000 ug | PREFILLED_SYRINGE | INTRAMUSCULAR | Status: DC
  Administered 2017-05-19: 100 ug via SUBCUTANEOUS

## 2017-05-19 MED ORDER — DARBEPOETIN ALFA 100 MCG/0.5ML IJ SOSY
PREFILLED_SYRINGE | INTRAMUSCULAR | Status: AC
  Administered 2017-05-19: 13:00:00 100 ug via SUBCUTANEOUS
  Filled 2017-05-19: qty 0.5

## 2017-06-27 ENCOUNTER — Ambulatory Visit (INDEPENDENT_AMBULATORY_CARE_PROVIDER_SITE_OTHER): Payer: PPO | Admitting: Medical

## 2017-06-27 ENCOUNTER — Encounter: Payer: Self-pay | Admitting: Medical

## 2017-06-27 VITALS — BP 142/71 | HR 74 | Temp 98.4°F | Resp 16 | Ht 66.0 in | Wt 195.3 lb

## 2017-06-27 DIAGNOSIS — R059 Cough, unspecified: Secondary | ICD-10-CM

## 2017-06-27 DIAGNOSIS — N289 Disorder of kidney and ureter, unspecified: Secondary | ICD-10-CM | POA: Diagnosis not present

## 2017-06-27 DIAGNOSIS — R05 Cough: Secondary | ICD-10-CM | POA: Diagnosis not present

## 2017-06-27 DIAGNOSIS — D649 Anemia, unspecified: Secondary | ICD-10-CM | POA: Diagnosis not present

## 2017-06-27 DIAGNOSIS — J40 Bronchitis, not specified as acute or chronic: Secondary | ICD-10-CM

## 2017-06-27 MED ORDER — DOXYCYCLINE HYCLATE 100 MG PO TABS
100.0000 mg | ORAL_TABLET | Freq: Two times a day (BID) | ORAL | 0 refills | Status: DC
Start: 2017-06-27 — End: 2017-07-22

## 2017-06-27 MED ORDER — DOXYCYCLINE HYCLATE 100 MG PO TABS: 100.0000 mg | ORAL_TABLET | Freq: Two times a day (BID) | ORAL | 0 refills | Status: DC

## 2017-06-27 MED ORDER — BENZONATATE 100 MG PO CAPS: 100.0000 mg | ORAL_CAPSULE | Freq: Three times a day (TID) | ORAL | 0 refills | Status: DC | PRN

## 2017-06-27 MED ORDER — FLUTICASONE PROPIONATE 50 MCG/ACT NA SUSP
2.0000 | Freq: Every day | NASAL | 1 refills | Status: DC
Start: 2017-06-27 — End: 2017-06-27

## 2017-06-27 MED ORDER — FLUTICASONE PROPIONATE 50 MCG/ACT NA SUSP: 2.0000 | Freq: Every day | NASAL | 1 refills | Status: DC

## 2017-06-27 NOTE — Patient Instructions (Addendum)
  You appear to have bronchitis. Rest hydrate and tylenol for fever. I am prescribing cough  Benzonatate medicine, and doxycycline antibiotic. For your nasal congestion rx flonase.  You should gradually get better. If not then notify us and would recommend a chest xray.  For history of renal insufficiency and history of anemia, I am getting both CBC and CMP today.  When I get those results back will fax those over to [facility].  Follow up in 7-10 days or as needed

## 2017-06-27 NOTE — Progress Notes (Signed)
Subjective:    Patient ID: Scott Jones, male    DOB: July 30, 1945, 72 y.o.   MRN: [id number]  HPI  Pt in for about 2 weeks of chest and nasal congestion. When he cough he will bring up mucus. More chest congestion. No fever, no chills or sweats.   Pt has known kidney disease. Pt has appointment with nephrologist next week. Pt also has history of anemia secondary to renal insufficiency.    Review of Systems  Constitutional: Negative for chills, fatigue and fever.  HENT: Positive for congestion.   Respiratory: Positive for cough. Negative for chest tightness, shortness of breath and wheezing.        Productive cough and chest congestion.  Cardiovascular: Negative for chest pain and palpitations.  Gastrointestinal: Negative for abdominal pain, blood in stool, constipation, nausea and vomiting.  Genitourinary: Negative for dysuria, flank pain and frequency.  Musculoskeletal: Negative for back pain.  Skin: Negative for rash.  Neurological: Negative for dizziness, speech difficulty, weakness, light-headedness and numbness.  Hematological: Negative for adenopathy. Does not bruise/bleed easily.  Psychiatric/Behavioral: Negative for behavioral problems, confusion and suicidal ideas. The patient is not nervous/anxious.     Past Medical History:  Diagnosis Date  . Allergy   . Asthma   . Chronic kidney disease   . Depression   . Diabetes mellitus   . Hepatitis C   . Hypertension      Social History   Socioeconomic History  . Marital status: Married    Spouse name: Not on file  . Number of children: Not on file  . Years of education: Not on file  . Highest education level: Not on file  Social Needs  . Financial resource strain: Not on file  . Food insecurity - worry: Not on file  . Food insecurity - inability: Not on file  . Transportation needs - medical: Not on file  . Transportation needs - non-medical: Not on file  Occupational History  . Not on file  Tobacco Use    . Smoking status: Former Smoker    Packs/day: 1.00    Years: 11.00    Pack years: 11.00    Types: Cigarettes, Pipe    Last attempt to quit: 08/07/1974    Years since quitting: 42.9  . Smokeless tobacco: Never Used  Substance and Sexual Activity  . Alcohol use: Yes    Alcohol/week: 0.6 oz    Types: 1 Cans of beer per week    Comment: 1 glass wine every few weeks.  . Drug use: Yes    Types: Marijuana    Comment: Marijuana cookie/candy 1x monthly  . Sexual activity: Yes    Birth control/protection: None  Other Topics Concern  . Not on file  Social History Narrative  . Not on file    Past Surgical History:  Procedure Laterality Date  . NEPHRECTOMY     partial - benign tumor  . PARTIAL NEPHRECTOMY  10/08/2011   Left kidney    Family History  Problem Relation Age of Onset  . Diabetes Mother   . Hypertension Mother   . COPD Mother   . Asthma Mother   . Diabetes Father   . Diabetes Sister   . Diabetes Brother   . Kidney disease Brother     No Known Allergies  Current Outpatient Medications on File Prior to Visit  Medication Sig Dispense Refill  . allopurinol (ZYLOPRIM) 100 MG tablet Take 1 tablet (100 mg total) by mouth daily.  90 tablet 3  . aspirin 81 MG tablet Take 81 mg by mouth daily.    . calcitRIOL (ROCALTROL) 0.25 MCG capsule Take 0.25 mcg by mouth 3 (three) times a week. Monday, Wednesday & Friday     . calcium carbonate (TUMS - DOSED IN MG ELEMENTAL CALCIUM) 500 MG chewable tablet Chew 1 tablet by mouth 3 (three) times daily with meals.     . Cholecalciferol (VITAMIN D3) 1000 units CAPS Take 1 capsule by mouth daily.    . diazepam (VALIUM) 2 MG tablet Take 1 tablet (2 mg total) 2 (two) times daily by mouth. Use as needed for muscle spasm 45 tablet 0  . docusate sodium (COLACE) 100 MG capsule Take 100 mg by mouth.    . furosemide (LASIX) 40 MG tablet Take 2 tablets in the morning and 1 in the afternoon 1 tablet 3  . HYDROcodone-acetaminophen (NORCO/VICODIN)  5-325 MG tablet Take 1 tablet by mouth every 6 (six) hours as needed for moderate pain. 30 tablet 0  . lisinopril (PRINIVIL,ZESTRIL) 40 MG tablet Take 1 tablet (40 mg total) by mouth daily. 90 tablet 1  . Multiple Vitamin (MULTIVITAMIN) tablet Take 1 tablet by mouth daily.    Marland Kitchen NIFEdipine (ADALAT CC) 90 MG 24 hr tablet Take 1 tablet (90 mg total) by mouth daily. 90 tablet 1  . pioglitazone (ACTOS) 30 MG tablet Take 1 tablet (30 mg total) by mouth daily. 90 tablet 1  . sertraline (ZOLOFT) 100 MG tablet Take 2 tablets (200 mg total) by mouth daily. 180 tablet 3  . simvastatin (ZOCOR) 40 MG tablet Take 1 tablet (40 mg total) by mouth daily. 90 tablet 1  . vitamin C (ASCORBIC ACID) 500 MG tablet Take 500 mg by mouth daily.    . vitamin E 400 UNIT capsule Take 400 Units by mouth daily.     No current facility-administered medications on file prior to visit.     BP (!) 142/71   Pulse 74   Temp 98.4 F (36.9 C) (Oral)   Resp 16   Ht $R'5\' 6"'pm$  (1.676 m)   Wt 195 lb 4.8 oz (88.6 kg)   SpO2 100%   BMI 31.52 kg/m       Objective:   Physical Exam   General  Mental Status - Alert. General Appearance - Well groomed. Not in acute distress.  Skin Rashes- No Rashes.  HEENT Head- Normal. Ear Auditory Canal - Left- Normal. Right - Normal.Tympanic Membrane- Left- Normal. Right- Normal. Eye Sclera/Conjunctiva- Left- Normal. Right- Normal. Nose & Sinuses Nasal Mucosa- Left-  Boggy and Congested. Right-  Boggy and  Congested.Bilateral maxillary and frontal sinus pressure. Mouth & Throat Lips: Upper Lip- Normal: no dryness, cracking, pallor, cyanosis, or vesicular eruption. Lower Lip-Normal: no dryness, cracking, pallor, cyanosis or vesicular eruption. Buccal Mucosa- Bilateral- No Aphthous ulcers. Oropharynx- No Discharge or Erythema. Tonsils: Characteristics- Bilateral- No Erythema or Congestion. Size/Enlargement- Bilateral- No enlargement. Discharge- bilateral-None.  Neck Neck- Supple. No  Masses.   Chest and Lung Exam Auscultation: Breath Sounds:-Clear even and unlabored.  Cardiovascular Auscultation:Rythm- Regular, rate and rhythm. Murmurs & Other Heart Sounds:Ausculatation of the heart reveal- No Murmurs.  Lymphatic Head & Neck General Head & Neck Lymphatics: Bilateral: Description- No Localized lymphadenopathy.      Assessment & Plan:  You appear to have bronchitis. Rest hydrate and tylenol for fever. I am prescribing cough  Benzonatate medicine, and doxycycline antibiotic. For your nasal congestion rx flonase.  You should gradually get better. If  not then notify us and would recommend a chest xray.  For history of renal insufficiency and history of anemia, I am getting both CBC and CMP today.  When I get those results back will fax those over to [facility].  Follow up in 7-10 days or as needed  Checked with pharmacist in our building no renal dose adjustment needed with doxy.  Mackie Pai, PA-C

## 2017-06-28 ENCOUNTER — Telehealth: Payer: Self-pay | Admitting: Medical

## 2017-06-28 ENCOUNTER — Telehealth: Payer: Self-pay | Admitting: *Deleted

## 2017-06-28 LAB — CBC WITH DIFFERENTIAL/PLATELET
BASOS ABS: 0.1 10*3/uL (ref 0.0–0.1)
Basophils Relative: 0.8 % (ref 0.0–3.0)
EOS ABS: 0.2 10*3/uL (ref 0.0–0.7)
Eosinophils Relative: 2.6 % (ref 0.0–5.0)
HCT: 25.3 % — ABNORMAL LOW (ref 39.0–52.0)
Hemoglobin: 8.5 g/dL — ABNORMAL LOW (ref 13.0–17.0)
LYMPHS ABS: 1.2 10*3/uL (ref 0.7–4.0)
Lymphocytes Relative: 12.6 % (ref 12.0–46.0)
MCHC: 33.6 g/dL (ref 30.0–36.0)
MCV: 102.1 fl — ABNORMAL HIGH (ref 78.0–100.0)
Monocytes Absolute: 0.6 10*3/uL (ref 0.1–1.0)
Monocytes Relative: 6.9 % (ref 3.0–12.0)
NEUTROS ABS: 7.1 10*3/uL (ref 1.4–7.7)
NEUTROS PCT: 77.1 % — AB (ref 43.0–77.0)
PLATELETS: 185 10*3/uL (ref 150.0–400.0)
RDW: 13.5 % (ref 11.5–15.5)
WBC: 9.3 10*3/uL (ref 4.0–10.5)

## 2017-06-28 LAB — COMPREHENSIVE METABOLIC PANEL
ALK PHOS: 48 U/L (ref 39–117)
ALT: 15 U/L (ref 0–53)
AST: 20 U/L (ref 0–37)
Albumin: 4.1 g/dL (ref 3.5–5.2)
BILIRUBIN TOTAL: 0.3 mg/dL (ref 0.2–1.2)
BUN: 77 mg/dL — AB (ref 6–23)
CO2: 29 meq/L (ref 19–32)
Calcium: 9.2 mg/dL (ref 8.4–10.5)
Chloride: 105 mEq/L (ref 96–112)
Creatinine, Ser: 4.18 mg/dL — ABNORMAL HIGH (ref 0.40–1.50)
GFR: 14.97 mL/min — AB (ref >60.00)
GLUCOSE: 137 mg/dL — AB (ref 70–99)
Potassium: 4 mEq/L (ref 3.5–5.1)
Sodium: 143 mEq/L (ref 135–145)
TOTAL PROTEIN: 7.4 g/dL (ref 6.0–8.3)

## 2017-06-28 NOTE — Telephone Encounter (Signed)
CRITICAL VALUE STICKER  CRITICAL VALUE:GFR:14.97 (H)  RECEIVER (on-site recipient of call):Angie  DATE & TIME NOTIFIED:06/28/17 @ 12:37pm  MESSENGER (representative from lab):Saa[place]Elam  MD NOTIFIED:Edward Saguier,PA-C   TIME OF NOTIFICATION: Reported @ 12:39pm  RESPONSE:Asked to send the information in a telephone note.

## 2017-06-28 NOTE — Telephone Encounter (Signed)
Patient CBC also needs to be sent over to nephrologist office/[facility].  He has history of anemia in the past related to kidney disease.  He states in the past if hemoglobin/hematocrit got too low they would give him injection.  Had discussion with him yesterday I think he might of indicated hb less than 8 was the treatment cutoff limit.

## 2017-06-28 NOTE — Telephone Encounter (Signed)
Aware of results. Getting labs sent to his nephrologist.  These are not new findings and he might start dialysis in the near future.

## 2017-06-30 ENCOUNTER — Encounter (HOSPITAL_COMMUNITY)
Admission: RE | Admit: 2017-06-30 | Discharge: 2017-06-30 | Disposition: A | Discharge status: Home / Self Care | Payer: PPO | Source: Ambulatory Visit | Attending: Nephrology | Admitting: Nephrology

## 2017-06-30 VITALS — BP 129/61 | HR 67 | Temp 98.0°F | Resp 16

## 2017-06-30 DIAGNOSIS — N184 Chronic kidney disease, stage 4 (severe): Secondary | ICD-10-CM | POA: Insufficient documentation

## 2017-06-30 DIAGNOSIS — Z5181 Encounter for therapeutic drug level monitoring: Secondary | ICD-10-CM | POA: Diagnosis not present

## 2017-06-30 DIAGNOSIS — D631 Anemia in chronic kidney disease: Secondary | ICD-10-CM | POA: Diagnosis not present

## 2017-06-30 DIAGNOSIS — Z79899 Other long term (current) drug therapy: Secondary | ICD-10-CM | POA: Insufficient documentation

## 2017-06-30 LAB — FERRITIN: FERRITIN: 168 ng/mL (ref 24–336)

## 2017-06-30 LAB — IRON AND TIBC
Iron: 114 ug/dL (ref 45–182)
Saturation Ratios: 31 % (ref 17.9–39.5)
TIBC: 365 ug/dL (ref 250–450)
UIBC: 251 ug/dL

## 2017-06-30 LAB — POCT HEMOGLOBIN-HEMACUE: Hemoglobin: 9 g/dL — ABNORMAL LOW (ref 13.0–17.0)

## 2017-06-30 MED ORDER — DARBEPOETIN ALFA 100 MCG/0.5ML IJ SOSY
PREFILLED_SYRINGE | INTRAMUSCULAR | Status: AC
  Administered 2017-06-30: 13:00:00 100 ug
  Filled 2017-06-30: qty 0.5

## 2017-06-30 MED ORDER — DARBEPOETIN ALFA 100 MCG/0.5ML IJ SOSY: 100.0000 ug | PREFILLED_SYRINGE | INTRAMUSCULAR | Status: DC

## 2017-06-30 NOTE — Telephone Encounter (Signed)
Labs faxed

## 2017-07-06 ENCOUNTER — Encounter: Payer: Self-pay | Admitting: *Deleted

## 2017-07-06 ENCOUNTER — Other Ambulatory Visit: Payer: Self-pay | Admitting: *Deleted

## 2017-07-06 DIAGNOSIS — S22088A Other fracture of T11-T12 vertebra, initial encounter for closed fracture: Secondary | ICD-10-CM | POA: Diagnosis not present

## 2017-07-06 DIAGNOSIS — S22080D Wedge compression fracture of T11-T12 vertebra, subsequent encounter for fracture with routine healing: Secondary | ICD-10-CM | POA: Diagnosis not present

## 2017-07-06 DIAGNOSIS — N289 Disorder of kidney and ureter, unspecified: Secondary | ICD-10-CM | POA: Diagnosis not present

## 2017-07-06 DIAGNOSIS — N186 End stage renal disease: Secondary | ICD-10-CM | POA: Diagnosis not present

## 2017-07-06 DIAGNOSIS — Z01818 Encounter for other preprocedural examination: Secondary | ICD-10-CM | POA: Diagnosis not present

## 2017-07-06 NOTE — Patient Outreach (Signed)
Pt returned my call today and we discussed his health care needs. He does have an AV graft. It took 3 surgeries to get it right which he had at the [facility] in [place]. He is on the transplant list at [facility]. He is going for a living donor, perhaps his daughter. He has anemia from chronic kidney disease and has been getting aranesp injections. He reports initially he was getting these every 2 weeks and then it was changed to every 4 weeks. He feels he would benefit from getting this every 3 weeks and will be asking his provider to do this. He tells me he has peridontal disease and have the dental insurance with [employer]. He wants to use this benefit for his peridontal maintenance visits every 3 months and needs this approved. He says otherwise he is doing well, he is active, eats right, maintains his glucose levels, last A1C was 7.5 and before that it was 5.7.  I advised him of our [facility] services. He and I do not feel like he needs [place] currently but he will keep my number handy for future reference.  I am going to send him a introductory letter and brochure.  Eulah Pont. Myrtie Neither, MSN, [facility] Gerontological Nurse Practitioner [facility] Management [phone]

## 2017-07-06 NOTE — Patient Outreach (Signed)
Initial [employer] High Risk Outreach for screening. Pt did not answer but I left a message and requested that he call me back at his convenience.  Scott Jones. Myrtie Neither, MSN, [facility] Gerontological Nurse Practitioner [facility] Care Management [phone]

## 2017-07-07 ENCOUNTER — Other Ambulatory Visit: Payer: Self-pay | Admitting: *Deleted

## 2017-07-07 DIAGNOSIS — D631 Anemia in chronic kidney disease: Secondary | ICD-10-CM | POA: Diagnosis not present

## 2017-07-07 DIAGNOSIS — N2581 Secondary hyperparathyroidism of renal origin: Secondary | ICD-10-CM | POA: Diagnosis not present

## 2017-07-07 DIAGNOSIS — N185 Chronic kidney disease, stage 5: Secondary | ICD-10-CM | POA: Diagnosis not present

## 2017-07-07 DIAGNOSIS — I120 Hypertensive chronic kidney disease with stage 5 chronic kidney disease or end stage renal disease: Secondary | ICD-10-CM | POA: Diagnosis not present

## 2017-07-22 ENCOUNTER — Ambulatory Visit (INDEPENDENT_AMBULATORY_CARE_PROVIDER_SITE_OTHER): Payer: PPO | Admitting: Family Medicine

## 2017-07-22 ENCOUNTER — Other Ambulatory Visit (HOSPITAL_COMMUNITY): Payer: Self-pay | Admitting: *Deleted

## 2017-07-22 ENCOUNTER — Encounter: Payer: Self-pay | Admitting: Family Medicine

## 2017-07-22 VITALS — BP 132/78 | HR 70 | Temp 97.8°F | Ht 66.0 in | Wt 191.4 lb

## 2017-07-22 DIAGNOSIS — R069 Unspecified abnormalities of breathing: Secondary | ICD-10-CM

## 2017-07-22 MED ORDER — ALBUTEROL SULFATE 108 (90 BASE) MCG/ACT IN AEPB: 1.0000 | INHALATION_SPRAY | Freq: Four times a day (QID) | RESPIRATORY_TRACT | 1 refills | Status: DC | PRN

## 2017-07-22 NOTE — Progress Notes (Signed)
Chief Complaint  Patient presents with  . Follow-up    pain in lungs    Subjective: Patient is a 72 y.o. male here for pain in his lungs.  This has been going on since 2 weeks ago after being tx'd for bacterial bronchitis. He has a hx of asthma that he grew out of.  He will intermittently get burning in his lungs.  He denies any coughing or shortness of breath.  He does not have a rescue inhaler at home.  Cold air seems to make this worse in addition to being sick.  He denies any fevers or other upper respiratory symptoms.  He is not currently having any chest pain, burning or shortness of breath.  ROS: Heart: Denies chest pain  Lungs: Denies SOB     Past Medical History:  Diagnosis Date  . Allergy   . Asthma   . Chronic kidney disease   . Depression   . Diabetes mellitus   . Hepatitis C   . Hypertension    No Known Allergies   Objective: BP 132/78 (BP Location: Right Arm, Patient Position: Sitting, Cuff Size: Large)   Pulse 70   Temp 97.8 F (36.6 C) (Oral)   Ht $R'5\' 6"'Bp$  (1.676 m)   Wt 191 lb 6 oz (86.8 kg)   SpO2 96%   BMI 30.89 kg/m  General: Awake, appears stated age HEENT: MMM, EOMi, there is patent, no discharge, ears are negative bilaterally Heart: RRR Lungs: CTAB, no rales, wheezes or rhonchi. No accessory muscle use Psych: Age appropriate judgment and insight, normal affect and mood  Assessment and Plan: Breathing problem - Plan: Albuterol Sulfate 108 (90 Base) MCG/ACT AEPB  The burning in his lungs makes me think bronchospasm, possibly related to underlying asthma.  Will call in an inhaler to have him take 10-15 minutes before he is going out of the cold or going to be exposed to a known trigger.  This can also be used as needed for shortness of breath, cough, or burning in the lungs. Follow-up in 2 weeks with Dr. Lorelei Pont, if he is doing better, cancel the appointment, be sure to give 24 hours notice. The patient voiced understanding and agreement to the  plan.  [place], DO 07/22/17  4:38 PM

## 2017-07-22 NOTE — Patient Instructions (Addendum)
Cancel appointment if you are doing better.  Use the inhaler 10-15 minutes before you are going to expose yourself to cold air or another trigger.  Let us know if you need anything.

## 2017-07-22 NOTE — Progress Notes (Signed)
Pre visit review using our clinic review tool, if applicable. No additional management support is needed unless otherwise documented below in the visit note. 

## 2017-07-25 ENCOUNTER — Ambulatory Visit (HOSPITAL_COMMUNITY)
Admission: RE | Admit: 2017-07-25 | Discharge: 2017-07-25 | Disposition: A | Discharge status: Home / Self Care | Payer: PPO | Source: Ambulatory Visit | Attending: Nephrology | Admitting: Nephrology

## 2017-07-25 VITALS — BP 129/60 | HR 71 | Temp 98.6°F | Resp 20 | Ht 66.0 in | Wt 190.0 lb

## 2017-07-25 DIAGNOSIS — D631 Anemia in chronic kidney disease: Secondary | ICD-10-CM | POA: Insufficient documentation

## 2017-07-25 DIAGNOSIS — N184 Chronic kidney disease, stage 4 (severe): Secondary | ICD-10-CM | POA: Diagnosis not present

## 2017-07-25 LAB — POCT HEMOGLOBIN-HEMACUE: Hemoglobin: 9.6 g/dL — ABNORMAL LOW (ref 13.0–17.0)

## 2017-07-25 MED ORDER — DARBEPOETIN ALFA 100 MCG/0.5ML IJ SOSY
100.0000 ug | PREFILLED_SYRINGE | INTRAMUSCULAR | Status: DC
  Administered 2017-07-25: 11:00:00 100 ug via SUBCUTANEOUS

## 2017-07-25 MED ORDER — DARBEPOETIN ALFA 100 MCG/0.5ML IJ SOSY
PREFILLED_SYRINGE | INTRAMUSCULAR | Status: AC
  Filled 2017-07-25: qty 0.5

## 2017-07-25 MED ORDER — SODIUM CHLORIDE 0.9 % IV SOLN
510.0000 mg | INTRAVENOUS | Status: DC
  Administered 2017-07-25: 510 mg via INTRAVENOUS
  Filled 2017-07-25: qty 17

## 2017-07-27 ENCOUNTER — Encounter (HOSPITAL_COMMUNITY): Payer: PPO

## 2017-07-29 ENCOUNTER — Other Ambulatory Visit (HOSPITAL_COMMUNITY): Payer: Self-pay | Admitting: *Deleted

## 2017-07-29 DIAGNOSIS — Z01810 Encounter for preprocedural cardiovascular examination: Secondary | ICD-10-CM | POA: Diagnosis not present

## 2017-07-29 DIAGNOSIS — I120 Hypertensive chronic kidney disease with stage 5 chronic kidney disease or end stage renal disease: Secondary | ICD-10-CM | POA: Diagnosis not present

## 2017-07-29 DIAGNOSIS — N184 Chronic kidney disease, stage 4 (severe): Secondary | ICD-10-CM | POA: Diagnosis not present

## 2017-07-29 DIAGNOSIS — N2889 Other specified disorders of kidney and ureter: Secondary | ICD-10-CM | POA: Diagnosis not present

## 2017-07-29 DIAGNOSIS — Z87448 Personal history of other diseases of urinary system: Secondary | ICD-10-CM | POA: Diagnosis not present

## 2017-07-29 DIAGNOSIS — E1122 Type 2 diabetes mellitus with diabetic chronic kidney disease: Secondary | ICD-10-CM | POA: Diagnosis not present

## 2017-07-29 DIAGNOSIS — Z01818 Encounter for other preprocedural examination: Secondary | ICD-10-CM | POA: Diagnosis not present

## 2017-08-01 ENCOUNTER — Encounter (HOSPITAL_COMMUNITY)
Admission: RE | Admit: 2017-08-01 | Discharge: 2017-08-01 | Disposition: A | Discharge status: Home / Self Care | Payer: PPO | Source: Ambulatory Visit | Attending: Nephrology | Admitting: Nephrology

## 2017-08-01 DIAGNOSIS — N189 Chronic kidney disease, unspecified: Secondary | ICD-10-CM | POA: Diagnosis not present

## 2017-08-01 DIAGNOSIS — D631 Anemia in chronic kidney disease: Secondary | ICD-10-CM | POA: Diagnosis not present

## 2017-08-01 MED ORDER — SODIUM CHLORIDE 0.9 % IV SOLN
510.0000 mg | INTRAVENOUS | Status: AC
  Administered 2017-08-01: 11:00:00 510 mg via INTRAVENOUS
  Filled 2017-08-01: qty 17

## 2017-08-08 ENCOUNTER — Ambulatory Visit: Payer: PPO | Admitting: Family Medicine

## 2017-08-15 ENCOUNTER — Inpatient Hospital Stay (HOSPITAL_COMMUNITY)
Admission: RE | Admit: 2017-08-15 | Discharge: 2017-08-15 | Disposition: A | Discharge status: Home / Self Care | Payer: PPO | Source: Ambulatory Visit | Attending: Nephrology | Admitting: Nephrology

## 2017-08-18 ENCOUNTER — Ambulatory Visit (HOSPITAL_COMMUNITY)
Admission: RE | Admit: 2017-08-18 | Discharge: 2017-08-18 | Disposition: A | Discharge status: Home / Self Care | Payer: PPO | Source: Ambulatory Visit | Attending: Nephrology | Admitting: Nephrology

## 2017-08-18 VITALS — BP 122/65 | HR 75 | Temp 98.7°F | Resp 20

## 2017-08-18 DIAGNOSIS — D631 Anemia in chronic kidney disease: Secondary | ICD-10-CM | POA: Diagnosis not present

## 2017-08-18 DIAGNOSIS — N189 Chronic kidney disease, unspecified: Secondary | ICD-10-CM | POA: Diagnosis not present

## 2017-08-18 DIAGNOSIS — N184 Chronic kidney disease, stage 4 (severe): Secondary | ICD-10-CM

## 2017-08-18 LAB — IRON AND TIBC
Iron: 122 ug/dL (ref 45–182)
Saturation Ratios: 39 % (ref 17.9–39.5)
TIBC: 314 ug/dL (ref 250–450)
UIBC: 192 ug/dL

## 2017-08-18 LAB — POCT HEMOGLOBIN-HEMACUE: HEMOGLOBIN: 9.8 g/dL — AB (ref 13.0–17.0)

## 2017-08-18 LAB — FERRITIN: Ferritin: 465 ng/mL — ABNORMAL HIGH (ref 24–336)

## 2017-08-18 MED ORDER — DARBEPOETIN ALFA 100 MCG/0.5ML IJ SOSY
100.0000 ug | PREFILLED_SYRINGE | INTRAMUSCULAR | Status: DC
  Administered 2017-08-18: 14:00:00 100 ug via SUBCUTANEOUS

## 2017-08-18 MED ORDER — DARBEPOETIN ALFA 100 MCG/0.5ML IJ SOSY
PREFILLED_SYRINGE | INTRAMUSCULAR | Status: AC
  Filled 2017-08-18: qty 0.5

## 2017-08-19 ENCOUNTER — Other Ambulatory Visit: Payer: Self-pay | Admitting: Family Medicine

## 2017-08-19 DIAGNOSIS — N184 Chronic kidney disease, stage 4 (severe): Principal | ICD-10-CM

## 2017-08-19 DIAGNOSIS — E1122 Type 2 diabetes mellitus with diabetic chronic kidney disease: Secondary | ICD-10-CM

## 2017-08-20 ENCOUNTER — Other Ambulatory Visit: Payer: Self-pay | Admitting: Family Medicine

## 2017-08-20 DIAGNOSIS — E785 Hyperlipidemia, unspecified: Secondary | ICD-10-CM

## 2017-08-22 ENCOUNTER — Encounter (HOSPITAL_COMMUNITY): Payer: PPO

## 2017-08-22 NOTE — Telephone Encounter (Signed)
Received refill request for pioglitazone (ACTOS) 30 MG tablet. Last office visit 03/28/2017 and last refill 01/14/17. Refill sent to pharmacy.

## 2017-08-24 ENCOUNTER — Other Ambulatory Visit: Payer: Self-pay | Admitting: Family Medicine

## 2017-08-24 DIAGNOSIS — I10 Essential (primary) hypertension: Secondary | ICD-10-CM

## 2017-08-30 DIAGNOSIS — J984 Other disorders of lung: Secondary | ICD-10-CM | POA: Diagnosis not present

## 2017-08-30 DIAGNOSIS — N186 End stage renal disease: Secondary | ICD-10-CM | POA: Diagnosis not present

## 2017-08-30 DIAGNOSIS — R911 Solitary pulmonary nodule: Secondary | ICD-10-CM | POA: Diagnosis not present

## 2017-08-30 DIAGNOSIS — Z01811 Encounter for preprocedural respiratory examination: Secondary | ICD-10-CM | POA: Diagnosis not present

## 2017-08-30 DIAGNOSIS — R918 Other nonspecific abnormal finding of lung field: Secondary | ICD-10-CM | POA: Diagnosis not present

## 2017-08-30 DIAGNOSIS — Z9889 Other specified postprocedural states: Secondary | ICD-10-CM | POA: Diagnosis not present

## 2017-08-30 DIAGNOSIS — Z01818 Encounter for other preprocedural examination: Secondary | ICD-10-CM | POA: Diagnosis not present

## 2017-08-30 DIAGNOSIS — S22080D Wedge compression fracture of T11-T12 vertebra, subsequent encounter for fracture with routine healing: Secondary | ICD-10-CM | POA: Diagnosis not present

## 2017-09-02 DIAGNOSIS — Z01818 Encounter for other preprocedural examination: Secondary | ICD-10-CM | POA: Diagnosis not present

## 2017-09-06 DIAGNOSIS — N185 Chronic kidney disease, stage 5: Secondary | ICD-10-CM | POA: Diagnosis not present

## 2017-09-06 DIAGNOSIS — I120 Hypertensive chronic kidney disease with stage 5 chronic kidney disease or end stage renal disease: Secondary | ICD-10-CM | POA: Diagnosis not present

## 2017-09-06 DIAGNOSIS — D631 Anemia in chronic kidney disease: Secondary | ICD-10-CM | POA: Diagnosis not present

## 2017-09-06 DIAGNOSIS — N2581 Secondary hyperparathyroidism of renal origin: Secondary | ICD-10-CM | POA: Diagnosis not present

## 2017-09-06 DIAGNOSIS — N189 Chronic kidney disease, unspecified: Secondary | ICD-10-CM | POA: Diagnosis not present

## 2017-09-08 ENCOUNTER — Ambulatory Visit (HOSPITAL_COMMUNITY)
Admission: RE | Admit: 2017-09-08 | Discharge: 2017-09-08 | Disposition: A | Discharge status: Home / Self Care | Payer: PPO | Source: Ambulatory Visit | Attending: Nephrology | Admitting: Nephrology

## 2017-09-08 VITALS — BP 152/64 | HR 67 | Resp 18

## 2017-09-08 DIAGNOSIS — N189 Chronic kidney disease, unspecified: Secondary | ICD-10-CM | POA: Diagnosis not present

## 2017-09-08 DIAGNOSIS — N184 Chronic kidney disease, stage 4 (severe): Secondary | ICD-10-CM

## 2017-09-08 DIAGNOSIS — D631 Anemia in chronic kidney disease: Secondary | ICD-10-CM | POA: Diagnosis not present

## 2017-09-08 LAB — POCT HEMOGLOBIN-HEMACUE: HEMOGLOBIN: 10.5 g/dL — AB (ref 13.0–17.0)

## 2017-09-08 MED ORDER — DARBEPOETIN ALFA 100 MCG/0.5ML IJ SOSY
100.0000 ug | PREFILLED_SYRINGE | INTRAMUSCULAR | Status: DC
  Administered 2017-09-08: 14:00:00 100 ug via SUBCUTANEOUS

## 2017-09-08 MED ORDER — DARBEPOETIN ALFA 100 MCG/0.5ML IJ SOSY
PREFILLED_SYRINGE | INTRAMUSCULAR | Status: AC
  Filled 2017-09-08: qty 0.5

## 2017-09-28 ENCOUNTER — Ambulatory Visit (INDEPENDENT_AMBULATORY_CARE_PROVIDER_SITE_OTHER): Payer: PPO | Admitting: Family Medicine

## 2017-09-28 ENCOUNTER — Encounter: Payer: Self-pay | Admitting: Family Medicine

## 2017-09-28 VITALS — BP 145/70 | HR 66 | Temp 98.4°F | Ht 66.0 in | Wt 185.4 lb

## 2017-09-28 DIAGNOSIS — S40021A Contusion of right upper arm, initial encounter: Secondary | ICD-10-CM | POA: Diagnosis not present

## 2017-09-28 DIAGNOSIS — N183 Chronic kidney disease, stage 3 unspecified: Secondary | ICD-10-CM

## 2017-09-28 DIAGNOSIS — M7021 Olecranon bursitis, right elbow: Secondary | ICD-10-CM | POA: Diagnosis not present

## 2017-09-28 DIAGNOSIS — Z794 Long term (current) use of insulin: Secondary | ICD-10-CM

## 2017-09-28 DIAGNOSIS — E1122 Type 2 diabetes mellitus with diabetic chronic kidney disease: Secondary | ICD-10-CM

## 2017-09-28 LAB — HEMOGLOBIN A1C: HEMOGLOBIN A1C: 5.9 % (ref 4.6–6.5)

## 2017-09-28 LAB — CBC
HCT: 33.8 % — ABNORMAL LOW (ref 39.0–52.0)
Hemoglobin: 11.6 g/dL — ABNORMAL LOW (ref 13.0–17.0)
MCHC: 34.3 g/dL (ref 30.0–36.0)
MCV: 101.6 fl — ABNORMAL HIGH (ref 78.0–100.0)
Platelets: 190 10*3/uL (ref 150.0–400.0)
RBC: 3.33 Mil/uL — ABNORMAL LOW (ref 4.22–5.81)
RDW: 14 % (ref 11.5–15.5)
WBC: 5.5 10*3/uL (ref 4.0–10.5)

## 2017-09-28 NOTE — Patient Instructions (Signed)
You have olecranon bursitis of your right elbow- this is not dangerous, but if you start to have any pain or redness of the elbow please seek care right away I think that you burst a small blood vessel in your arm golfing.  It appears to be getting better- let it rest for a week or so and then you can golf. Let me know if not getting better I will be in touch with your labs Take care!

## 2017-09-28 NOTE — Progress Notes (Signed)
[place] [facility] [address] [place],  [address] [phone]  Date:  09/28/2017   Name:  Scott Jones   DOB:  1945-07-13   MRN:  [id number]  PCP:  Darreld Mclean, MD    Chief Complaint: Bursitis (c/o bursitis in right elbow )   History of Present Illness:  Scott Jones is a 72 y.o. very pleasant male patient who presents with the following:  Here today with a right arm concern He has end stage renal failure and is on dialysis.  He is starting the transplant w/u at [facility] History of DM, hep C (cured) and HTN He has noted swelling of his right elbow for about 3 weeks- never had in the past.  He is not aware of any elbow injury.  Then started to notice a soft swelling over the point of the elbow.  No pain with ROM, no heat or redness Then 10 days ago he played golf and got some swelling and soreness in his right forearm- then developed a bruise in the same distribution.  It does seem to be fading He is able to uses his arm without pain and it shows normal strength and function He is otherwise overall feeling well   He is right handed  Dr Posey Pronto is his nephrologist  Lab Results  Component Value Date   HGBA1C 7.4 (H) 03/28/2017     Patient Active Problem List   Diagnosis Date Noted  . Acute gouty arthritis 10/13/2016  . H/O partial nephrectomy 02/16/2016  . Depression (emotion) 12/28/2015  . Allergic rhinitis 12/28/2015  . History of hepatitis C 12/28/2015  . Dyslipidemia 12/28/2015  . Chronic kidney disease (CKD), stage IV (severe) ([place]) 12/28/2015  . DM (diabetes mellitus) ([place]) 02/02/2012  . HTN (hypertension) 02/02/2012    Past Medical History:  Diagnosis Date  . Allergy   . Asthma   . Chronic kidney disease   . Depression   . Diabetes mellitus   . Hepatitis C   . Hypertension     Past Surgical History:  Procedure Laterality Date  . NEPHRECTOMY     partial - benign tumor  .  PARTIAL NEPHRECTOMY  10/08/2011   Left kidney    Social History   Tobacco Use  . Smoking status: Former Smoker    Packs/day: 1.00    Years: 11.00    Pack years: 11.00    Types: Cigarettes, Pipe    Last attempt to quit: 08/07/1974    Years since quitting: 43.1  . Smokeless tobacco: Never Used  Substance Use Topics  . Alcohol use: Yes    Alcohol/week: 0.6 oz    Types: 1 Cans of beer per week    Comment: 1 glass wine every few weeks.  . Drug use: Yes    Types: Marijuana    Comment: Marijuana cookie/candy 1x monthly    Family History  Problem Relation Age of Onset  . Diabetes Mother   . Hypertension Mother   . COPD Mother   . Asthma Mother   . Diabetes Father   . Diabetes Sister   . Diabetes Brother   . Kidney disease Brother     No Known Allergies  Medication list has been reviewed and updated.  Current Outpatient Medications on File Prior to Visit  Medication Sig Dispense Refill  . Homeopathic Products (LEG CRAMP RELIEF PO) Take 1 tablet by mouth as needed.    Marland Kitchen  Albuterol Sulfate 108 (90 Base) MCG/ACT AEPB Inhale 1-2 puffs into the lungs every 6 (six) hours as needed (shortness of breath). (Patient not taking: Reported on 09/28/2017) 1 each 1  . allopurinol (ZYLOPRIM) 100 MG tablet Take 1 tablet (100 mg total) by mouth daily. 90 tablet 3  . aspirin 81 MG tablet Take 81 mg by mouth daily.    . calcitRIOL (ROCALTROL) 0.25 MCG capsule Take 0.25 mcg by mouth 3 (three) times a week. Monday, Wednesday & Friday     . calcium carbonate (TUMS - DOSED IN MG ELEMENTAL CALCIUM) 500 MG chewable tablet Chew 1 tablet by mouth 3 (three) times daily with meals.     . Cholecalciferol (VITAMIN D3) 1000 units CAPS Take 1 capsule by mouth daily.    Marland Kitchen docusate sodium (COLACE) 100 MG capsule Take 100 mg by mouth.    . fluticasone (FLONASE) 50 MCG/ACT nasal spray Place 2 sprays into both nostrils daily. 16 g 1  . furosemide (LASIX) 40 MG tablet Take 2 tablets in the morning and 1 in the  afternoon 1 tablet 3  . glipiZIDE (GLUCOTROL) 5 MG tablet Take 5 mg by mouth daily before breakfast.    . lisinopril (PRINIVIL,ZESTRIL) 40 MG tablet Take 1 tablet (40 mg total) by mouth daily. 90 tablet 1  . Multiple Vitamin (MULTIVITAMIN) tablet Take 1 tablet by mouth daily.    Marland Kitchen NIFEdipine (ADALAT CC) 90 MG 24 hr tablet TAKE 1 TABLET BY MOUTH ONCE DAILY 90 tablet 1  . pioglitazone (ACTOS) 30 MG tablet TAKE 1 TABLET BY MOUTH ONCE DAILY 90 tablet 1  . sertraline (ZOLOFT) 100 MG tablet Take 2 tablets (200 mg total) by mouth daily. 180 tablet 3  . simvastatin (ZOCOR) 40 MG tablet TAKE 1 TABLET BY MOUTH ONCE DAILY 90 tablet 1  . vitamin C (ASCORBIC ACID) 500 MG tablet Take 500 mg by mouth daily.    . vitamin E 400 UNIT capsule Take 400 Units by mouth daily.     No current facility-administered medications on file prior to visit.     Review of Systems:  As per HPI- otherwise negative. No fever or chills No CP or SOB No rash    Physical Examination: Vitals:   09/28/17 1343  BP: (!) 145/70  Pulse: 66  Temp: 98.4 F (36.9 C)  SpO2: 98%   Vitals:   09/28/17 1343  Weight: 185 lb 6.4 oz (84.1 kg)  Height: $Remove'5\' 6"'eMeACHL$  (1.676 m)   Body mass index is 29.92 kg/m. Ideal Body Weight: Weight in (lb) to have BMI = 25: 154.6  GEN: WDWN, NAD, Non-toxic, A & O x 3, look well, central obesity  HEENT: Atraumatic, Normocephalic. Neck supple. No masses, No LAD. Ears and Nose: No external deformity. CV: RRR, No M/G/R. No JVD. No thrill. No extra heart sounds. PULM: CTA B, no wheezes, crackles, rhonchi. No retractions. No resp. distress. No accessory muscle use. EXTR: No c/c/e NEURO Normal gait.  PSYCH: Normally interactive. Conversant. Not depressed or anxious appearing.  Calm demeanor.  Right arm displays soft, non tender olecranon bursitis, the size of a small plum.  Normal ROM of the elbow, and no redness or tenderness or heat The ulnar, ventral aspect of the forearm also displays a large,  resolving bruise with some tenderness in the bruise distrubution.   Normal strength and ROM of the hand and arm Foot exam today  Assessment and Plan: Olecranon bursitis of right elbow  Type 2 diabetes mellitus with stage  3 chronic kidney disease, with long-term current use of insulin (HCC) - Plan: Hemoglobin A1c  Arm bruise, right, initial encounter - Plan: CBC  Olecranon bursitis- given his other health concerns would not attempt drainage or steroid introduction due to risk of infection, and he cannot use NSAIDs due to end stage renal disease.  As he has much more urgent concerns- he is looking into getting a renal transplant -he will observe this for now and we may do further treatment down the road  It looks like he sustained a hematoma in his forearm while golfing 10 days ago- resultant bruise is getting better Overall his right arm and elbow show normal ROM and there is no joint pain  As long as this continues to get better nothing further is needed. Will check routine labs while he is here today   Signed Lamar Blinks, MD   Received labs, message to pt  Results for orders placed or performed in visit on 09/28/17  Hemoglobin A1c  Result Value Ref Range   Hgb A1c MFr Bld 5.9 4.6 - 6.5 %  CBC  Result Value Ref Range   WBC 5.5 4.0 - 10.5 K/uL   RBC 3.33 (L) 4.22 - 5.81 Mil/uL   Platelets 190.0 150.0 - 400.0 K/uL   Hemoglobin 11.6 (L) 13.0 - 17.0 g/dL   HCT 33.8 (L) 39.0 - 52.0 %   MCV 101.6 (H) 78.0 - 100.0 fl   MCHC 34.3 30.0 - 36.0 g/dL   RDW 14.0 11.5 - 15.5 %

## 2017-09-29 ENCOUNTER — Ambulatory Visit (HOSPITAL_COMMUNITY)
Admission: RE | Admit: 2017-09-29 | Discharge: 2017-09-29 | Disposition: A | Discharge status: Home / Self Care | Payer: PPO | Source: Ambulatory Visit | Attending: Nephrology | Admitting: Nephrology

## 2017-09-29 VITALS — BP 109/65 | HR 64 | Temp 98.7°F | Resp 20

## 2017-09-29 DIAGNOSIS — N184 Chronic kidney disease, stage 4 (severe): Secondary | ICD-10-CM

## 2017-09-29 DIAGNOSIS — D631 Anemia in chronic kidney disease: Secondary | ICD-10-CM | POA: Insufficient documentation

## 2017-09-29 LAB — IRON AND TIBC
Iron: 145 ug/dL (ref 45–182)
Saturation Ratios: 44 % — ABNORMAL HIGH (ref 17.9–39.5)
TIBC: 326 ug/dL (ref 250–450)
UIBC: 181 ug/dL

## 2017-09-29 LAB — POCT HEMOGLOBIN-HEMACUE: HEMOGLOBIN: 10.6 g/dL — AB (ref 13.0–17.0)

## 2017-09-29 LAB — FERRITIN: Ferritin: 241 ng/mL (ref 24–336)

## 2017-09-29 MED ORDER — DARBEPOETIN ALFA 100 MCG/0.5ML IJ SOSY
PREFILLED_SYRINGE | INTRAMUSCULAR | Status: AC
  Administered 2017-09-29: 100 ug via SUBCUTANEOUS
  Filled 2017-09-29: qty 0.5

## 2017-09-29 MED ORDER — DARBEPOETIN ALFA 100 MCG/0.5ML IJ SOSY
100.0000 ug | PREFILLED_SYRINGE | INTRAMUSCULAR | Status: DC
  Administered 2017-09-29: 100 ug via SUBCUTANEOUS

## 2017-10-19 ENCOUNTER — Encounter (HOSPITAL_COMMUNITY): Payer: PPO

## 2017-10-20 ENCOUNTER — Ambulatory Visit (HOSPITAL_COMMUNITY)
Admission: RE | Admit: 2017-10-20 | Discharge: 2017-10-20 | Disposition: A | Discharge status: Home / Self Care | Payer: PPO | Source: Ambulatory Visit | Attending: Nephrology | Admitting: Nephrology

## 2017-10-20 VITALS — BP 140/75 | HR 72 | Temp 98.9°F | Resp 20

## 2017-10-20 DIAGNOSIS — N184 Chronic kidney disease, stage 4 (severe): Secondary | ICD-10-CM | POA: Insufficient documentation

## 2017-10-20 DIAGNOSIS — D631 Anemia in chronic kidney disease: Secondary | ICD-10-CM | POA: Insufficient documentation

## 2017-10-20 LAB — POCT HEMOGLOBIN-HEMACUE: HEMOGLOBIN: 11.8 g/dL — AB (ref 13.0–17.0)

## 2017-10-20 MED ORDER — DARBEPOETIN ALFA 100 MCG/0.5ML IJ SOSY
PREFILLED_SYRINGE | INTRAMUSCULAR | Status: AC
  Filled 2017-10-20: qty 0.5

## 2017-10-20 MED ORDER — DARBEPOETIN ALFA 100 MCG/0.5ML IJ SOSY
100.0000 ug | PREFILLED_SYRINGE | INTRAMUSCULAR | Status: DC
  Administered 2017-10-20: 100 ug via SUBCUTANEOUS

## 2017-11-03 DIAGNOSIS — Z905 Acquired absence of kidney: Secondary | ICD-10-CM | POA: Diagnosis not present

## 2017-11-03 DIAGNOSIS — N35919 Unspecified urethral stricture, male, unspecified site: Secondary | ICD-10-CM | POA: Diagnosis not present

## 2017-11-03 DIAGNOSIS — R972 Elevated prostate specific antigen [PSA]: Secondary | ICD-10-CM | POA: Diagnosis not present

## 2017-11-03 DIAGNOSIS — Z01818 Encounter for other preprocedural examination: Secondary | ICD-10-CM | POA: Diagnosis not present

## 2017-11-05 ENCOUNTER — Other Ambulatory Visit: Payer: Self-pay | Admitting: Family Medicine

## 2017-11-05 DIAGNOSIS — I10 Essential (primary) hypertension: Secondary | ICD-10-CM

## 2017-11-10 ENCOUNTER — Ambulatory Visit (HOSPITAL_COMMUNITY)
Admission: RE | Admit: 2017-11-10 | Discharge: 2017-11-10 | Disposition: A | Discharge status: Home / Self Care | Payer: PPO | Source: Ambulatory Visit | Attending: Nephrology | Admitting: Nephrology

## 2017-11-10 VITALS — BP 129/69 | HR 56 | Temp 98.5°F | Resp 20

## 2017-11-10 DIAGNOSIS — N184 Chronic kidney disease, stage 4 (severe): Secondary | ICD-10-CM | POA: Insufficient documentation

## 2017-11-10 DIAGNOSIS — D631 Anemia in chronic kidney disease: Secondary | ICD-10-CM | POA: Insufficient documentation

## 2017-11-10 LAB — IRON AND TIBC
IRON: 137 ug/dL (ref 45–182)
Saturation Ratios: 39 % (ref 17.9–39.5)
TIBC: 350 ug/dL (ref 250–450)
UIBC: 213 ug/dL

## 2017-11-10 LAB — FERRITIN: Ferritin: 208 ng/mL (ref 24–336)

## 2017-11-10 LAB — POCT HEMOGLOBIN-HEMACUE: HEMOGLOBIN: 10.2 g/dL — AB (ref 13.0–17.0)

## 2017-11-10 LAB — HM DIABETES EYE EXAM

## 2017-11-10 MED ORDER — DARBEPOETIN ALFA 100 MCG/0.5ML IJ SOSY
100.0000 ug | PREFILLED_SYRINGE | INTRAMUSCULAR | Status: DC
  Administered 2017-11-10: 100 ug via SUBCUTANEOUS

## 2017-11-10 MED ORDER — DARBEPOETIN ALFA 100 MCG/0.5ML IJ SOSY
PREFILLED_SYRINGE | INTRAMUSCULAR | Status: AC
  Administered 2017-11-10: 100 ug via SUBCUTANEOUS
  Filled 2017-11-10: qty 0.5

## 2017-11-14 DIAGNOSIS — N185 Chronic kidney disease, stage 5: Secondary | ICD-10-CM | POA: Diagnosis not present

## 2017-11-14 DIAGNOSIS — I120 Hypertensive chronic kidney disease with stage 5 chronic kidney disease or end stage renal disease: Secondary | ICD-10-CM | POA: Diagnosis not present

## 2017-11-14 DIAGNOSIS — N2581 Secondary hyperparathyroidism of renal origin: Secondary | ICD-10-CM | POA: Diagnosis not present

## 2017-11-14 DIAGNOSIS — D631 Anemia in chronic kidney disease: Secondary | ICD-10-CM | POA: Diagnosis not present

## 2017-11-15 DIAGNOSIS — R972 Elevated prostate specific antigen [PSA]: Secondary | ICD-10-CM | POA: Diagnosis not present

## 2017-11-17 ENCOUNTER — Other Ambulatory Visit: Payer: Self-pay | Admitting: Internal Medicine

## 2017-12-01 ENCOUNTER — Ambulatory Visit (HOSPITAL_COMMUNITY)
Admission: RE | Admit: 2017-12-01 | Discharge: 2017-12-01 | Disposition: A | Discharge status: Home / Self Care | Payer: PPO | Source: Ambulatory Visit | Attending: Endocrinology | Admitting: Endocrinology

## 2017-12-01 VITALS — BP 123/56 | HR 66 | Temp 98.1°F | Ht 66.0 in | Wt 185.0 lb

## 2017-12-01 DIAGNOSIS — N184 Chronic kidney disease, stage 4 (severe): Secondary | ICD-10-CM | POA: Insufficient documentation

## 2017-12-01 DIAGNOSIS — Z5181 Encounter for therapeutic drug level monitoring: Secondary | ICD-10-CM | POA: Insufficient documentation

## 2017-12-01 DIAGNOSIS — D631 Anemia in chronic kidney disease: Secondary | ICD-10-CM | POA: Diagnosis not present

## 2017-12-01 DIAGNOSIS — Z79899 Other long term (current) drug therapy: Secondary | ICD-10-CM | POA: Diagnosis not present

## 2017-12-01 LAB — IRON AND TIBC
Iron: 129 ug/dL (ref 45–182)
Saturation Ratios: 38 % (ref 17.9–39.5)
TIBC: 342 ug/dL (ref 250–450)
UIBC: 213 ug/dL

## 2017-12-01 LAB — POCT HEMOGLOBIN-HEMACUE: HEMOGLOBIN: 10.7 g/dL — AB (ref 13.0–17.0)

## 2017-12-01 LAB — FERRITIN: FERRITIN: 211 ng/mL (ref 24–336)

## 2017-12-01 MED ORDER — DARBEPOETIN ALFA 100 MCG/0.5ML IJ SOSY
PREFILLED_SYRINGE | INTRAMUSCULAR | Status: AC
  Administered 2017-12-01: 100 ug via SUBCUTANEOUS
  Filled 2017-12-01: qty 0.5

## 2017-12-01 MED ORDER — DARBEPOETIN ALFA 100 MCG/0.5ML IJ SOSY
100.0000 ug | PREFILLED_SYRINGE | INTRAMUSCULAR | Status: DC
  Administered 2017-12-01: 100 ug via SUBCUTANEOUS

## 2017-12-07 ENCOUNTER — Encounter: Payer: Self-pay | Admitting: *Deleted

## 2017-12-22 ENCOUNTER — Ambulatory Visit (HOSPITAL_COMMUNITY)
Admission: RE | Admit: 2017-12-22 | Discharge: 2017-12-22 | Disposition: A | Discharge status: Home / Self Care | Payer: PPO | Source: Ambulatory Visit | Attending: Nephrology | Admitting: Nephrology

## 2017-12-22 VITALS — BP 137/71 | HR 70 | Temp 98.3°F

## 2017-12-22 DIAGNOSIS — D631 Anemia in chronic kidney disease: Secondary | ICD-10-CM | POA: Insufficient documentation

## 2017-12-22 DIAGNOSIS — Z79899 Other long term (current) drug therapy: Secondary | ICD-10-CM | POA: Insufficient documentation

## 2017-12-22 DIAGNOSIS — N184 Chronic kidney disease, stage 4 (severe): Secondary | ICD-10-CM | POA: Diagnosis not present

## 2017-12-22 DIAGNOSIS — Z5181 Encounter for therapeutic drug level monitoring: Secondary | ICD-10-CM | POA: Diagnosis not present

## 2017-12-22 LAB — FERRITIN: Ferritin: 225 ng/mL (ref 24–336)

## 2017-12-22 LAB — IRON AND TIBC
Iron: 158 ug/dL (ref 45–182)
Saturation Ratios: 43 % — ABNORMAL HIGH (ref 17.9–39.5)
TIBC: 367 ug/dL (ref 250–450)
UIBC: 209 ug/dL

## 2017-12-22 LAB — POCT HEMOGLOBIN-HEMACUE: HEMOGLOBIN: 11.9 g/dL — AB (ref 13.0–17.0)

## 2017-12-22 MED ORDER — DARBEPOETIN ALFA 100 MCG/0.5ML IJ SOSY
PREFILLED_SYRINGE | INTRAMUSCULAR | Status: AC
  Filled 2017-12-22: qty 0.5

## 2017-12-22 MED ORDER — DARBEPOETIN ALFA 100 MCG/0.5ML IJ SOSY
100.0000 ug | PREFILLED_SYRINGE | INTRAMUSCULAR | Status: DC
  Administered 2017-12-22: 100 ug via SUBCUTANEOUS

## 2018-01-12 ENCOUNTER — Encounter (HOSPITAL_COMMUNITY): Payer: Self-pay | Admitting: Emergency Medicine

## 2018-01-12 ENCOUNTER — Ambulatory Visit (HOSPITAL_COMMUNITY)
Admission: RE | Admit: 2018-01-12 | Discharge: 2018-01-12 | Disposition: A | Discharge status: Home / Self Care | Payer: PPO | Source: Ambulatory Visit | Attending: Nephrology | Admitting: Nephrology

## 2018-01-12 ENCOUNTER — Emergency Department (HOSPITAL_COMMUNITY)
Admission: EM | Admit: 2018-01-12 | Discharge: 2018-01-12 | Disposition: A | Discharge status: Home / Self Care | Payer: PPO | Attending: Emergency Medicine | Admitting: Emergency Medicine

## 2018-01-12 VITALS — BP 167/84 | HR 75 | Temp 98.7°F | Resp 20

## 2018-01-12 DIAGNOSIS — Z7982 Long term (current) use of aspirin: Secondary | ICD-10-CM | POA: Insufficient documentation

## 2018-01-12 DIAGNOSIS — N184 Chronic kidney disease, stage 4 (severe): Secondary | ICD-10-CM | POA: Insufficient documentation

## 2018-01-12 DIAGNOSIS — Y998 Other external cause status: Secondary | ICD-10-CM | POA: Diagnosis not present

## 2018-01-12 DIAGNOSIS — Y92003 Bedroom of unspecified non-institutional (private) residence as the place of occurrence of the external cause: Secondary | ICD-10-CM | POA: Insufficient documentation

## 2018-01-12 DIAGNOSIS — D631 Anemia in chronic kidney disease: Secondary | ICD-10-CM | POA: Diagnosis not present

## 2018-01-12 DIAGNOSIS — I10 Essential (primary) hypertension: Secondary | ICD-10-CM | POA: Diagnosis not present

## 2018-01-12 DIAGNOSIS — R42 Dizziness and giddiness: Secondary | ICD-10-CM | POA: Diagnosis not present

## 2018-01-12 DIAGNOSIS — W19XXXA Unspecified fall, initial encounter: Secondary | ICD-10-CM

## 2018-01-12 DIAGNOSIS — S0101XA Laceration without foreign body of scalp, initial encounter: Secondary | ICD-10-CM | POA: Diagnosis not present

## 2018-01-12 DIAGNOSIS — Z7984 Long term (current) use of oral hypoglycemic drugs: Secondary | ICD-10-CM | POA: Diagnosis not present

## 2018-01-12 DIAGNOSIS — S0990XA Unspecified injury of head, initial encounter: Secondary | ICD-10-CM | POA: Diagnosis not present

## 2018-01-12 DIAGNOSIS — Y939 Activity, unspecified: Secondary | ICD-10-CM | POA: Diagnosis not present

## 2018-01-12 DIAGNOSIS — Y92009 Unspecified place in unspecified non-institutional (private) residence as the place of occurrence of the external cause: Secondary | ICD-10-CM

## 2018-01-12 DIAGNOSIS — N2581 Secondary hyperparathyroidism of renal origin: Secondary | ICD-10-CM | POA: Diagnosis not present

## 2018-01-12 DIAGNOSIS — W01190A Fall on same level from slipping, tripping and stumbling with subsequent striking against furniture, initial encounter: Secondary | ICD-10-CM | POA: Insufficient documentation

## 2018-01-12 DIAGNOSIS — E119 Type 2 diabetes mellitus without complications: Secondary | ICD-10-CM | POA: Diagnosis not present

## 2018-01-12 DIAGNOSIS — Z87891 Personal history of nicotine dependence: Secondary | ICD-10-CM | POA: Diagnosis not present

## 2018-01-12 DIAGNOSIS — N185 Chronic kidney disease, stage 5: Secondary | ICD-10-CM | POA: Diagnosis not present

## 2018-01-12 DIAGNOSIS — Z79899 Other long term (current) drug therapy: Secondary | ICD-10-CM | POA: Insufficient documentation

## 2018-01-12 DIAGNOSIS — J45909 Unspecified asthma, uncomplicated: Secondary | ICD-10-CM | POA: Diagnosis not present

## 2018-01-12 DIAGNOSIS — I120 Hypertensive chronic kidney disease with stage 5 chronic kidney disease or end stage renal disease: Secondary | ICD-10-CM | POA: Diagnosis not present

## 2018-01-12 LAB — POCT HEMOGLOBIN-HEMACUE: Hemoglobin: 11.9 g/dL — ABNORMAL LOW (ref 13.0–17.0)

## 2018-01-12 MED ORDER — DARBEPOETIN ALFA 100 MCG/0.5ML IJ SOSY
100.0000 ug | PREFILLED_SYRINGE | INTRAMUSCULAR | Status: DC
  Administered 2018-01-12: 100 ug via SUBCUTANEOUS

## 2018-01-12 MED ORDER — DARBEPOETIN ALFA 100 MCG/0.5ML IJ SOSY
PREFILLED_SYRINGE | INTRAMUSCULAR | Status: AC
  Administered 2018-01-12: 100 ug via SUBCUTANEOUS
  Filled 2018-01-12: qty 0.5

## 2018-01-12 NOTE — ED Provider Notes (Signed)
[facility] DEPT Provider Note: Georgena Spurling, MD, FACEP  CSN: [id number] MRN: [id number] ARRIVAL: 01/12/18 at [place]: [place]  Head Injury   HISTORY OF PRESENT ILLNESS  01/12/18 6:27 AM Scott Jones is a 72 y.o. male who went to sit in a chair in the dark this morning, just prior to arrival, and missed the chair falling backwards and striking the back of his head against the bed frame.  He has a laceration to his occiput.  There has been moderate bleeding which has been controlled with pressure.  He denies significant pain.  He denies neck pain.  He denies loss of consciousness, nausea or vomiting.  He states his tetanus is up-to-date.   Past Medical History:  Diagnosis Date  . Allergy   . Asthma   . Chronic kidney disease   . Depression   . Diabetes mellitus   . Hepatitis C   . Hypertension     Past Surgical History:  Procedure Laterality Date  . NEPHRECTOMY     partial - benign tumor  . PARTIAL NEPHRECTOMY  10/08/2011   Left kidney    Family History  Problem Relation Age of Onset  . Diabetes Mother   . Hypertension Mother   . COPD Mother   . Asthma Mother   . Diabetes Father   . Diabetes Sister   . Diabetes Brother   . Kidney disease Brother     Social History   Tobacco Use  . Smoking status: Former Smoker    Packs/day: 1.00    Years: 11.00    Pack years: 11.00    Types: Cigarettes, Pipe    Last attempt to quit: 08/07/1974    Years since quitting: 43.4  . Smokeless tobacco: Never Used  Substance Use Topics  . Alcohol use: Yes    Alcohol/week: 1.0 standard drinks    Types: 1 Cans of beer per week    Comment: 1 glass wine every few weeks.  . Drug use: Yes    Types: Marijuana    Comment: Marijuana cookie/candy 1x monthly    Prior to Admission medications   Medication Sig Start Date End Date Taking? Authorizing Provider  Albuterol Sulfate 108 (90 Base) MCG/ACT AEPB Inhale 1-2 puffs into the lungs every 6 (six)  hours as needed (shortness of breath). Patient not taking: Reported on 09/28/2017 07/22/17   Shelda Pal, DO  allopurinol (ZYLOPRIM) 100 MG tablet TAKE 1 TABLET BY MOUTH ONCE DAILY 11/17/17   Copland, Gay Filler, MD  aspirin 81 MG tablet Take 81 mg by mouth daily.    [provider]  calcitRIOL (ROCALTROL) 0.25 MCG capsule Take 0.25 mcg by mouth 3 (three) times a week. Monday, Wednesday & Friday     [provider]  calcium carbonate (TUMS - DOSED IN MG ELEMENTAL CALCIUM) 500 MG chewable tablet Chew 1 tablet by mouth 3 (three) times daily with meals.     [provider]  Cholecalciferol (VITAMIN D3) 1000 units CAPS Take 1 capsule by mouth daily.    [provider]  docusate sodium (COLACE) 100 MG capsule Take 100 mg by mouth. 10/10/11   [provider]  fluticasone (FLONASE) 50 MCG/ACT nasal spray Place 2 sprays into both nostrils daily. 06/27/17   Saguier, Percell Miller, PA-C  furosemide (LASIX) 40 MG tablet Take 2 tablets in the morning and 1 in the afternoon 07/26/16   Copland, Gay Filler, MD  glipiZIDE (GLUCOTROL) 5 MG tablet Take  5 mg by mouth daily before breakfast.    [provider]  Homeopathic Products (LEG CRAMP RELIEF PO) Take 1 tablet by mouth as needed.    [provider]  lisinopril (PRINIVIL,ZESTRIL) 40 MG tablet TAKE 1 TABLET BY MOUTH ONCE DAILY 11/07/17   Copland, Gay Filler, MD  Multiple Vitamin (MULTIVITAMIN) tablet Take 1 tablet by mouth daily.    [provider]  NIFEdipine (ADALAT CC) 90 MG 24 hr tablet TAKE 1 TABLET BY MOUTH ONCE DAILY 08/24/17   Copland, Gay Filler, MD  pioglitazone (ACTOS) 30 MG tablet TAKE 1 TABLET BY MOUTH ONCE DAILY 08/22/17   Copland, Gay Filler, MD  sertraline (ZOLOFT) 100 MG tablet Take 2 tablets (200 mg total) by mouth daily. 12/01/16   Copland, Gay Filler, MD  simvastatin (ZOCOR) 40 MG tablet TAKE 1 TABLET BY MOUTH ONCE DAILY 08/22/17   Copland, Gay Filler, MD  vitamin C (ASCORBIC ACID) 500 MG  tablet Take 500 mg by mouth daily.    [provider]  vitamin E 400 UNIT capsule Take 400 Units by mouth daily.    [provider]    Allergies Patient has no known allergies.   REVIEW OF SYSTEMS  Negative except as noted here or in the History of Present Illness.   PHYSICAL EXAMINATION  Initial Vital Signs Blood pressure (!) 177/84, pulse 63, temperature 98.5 F (36.9 C), height $RemoveBe'5\' 6"'viXwOCePn$  (1.676 m), weight 83.9 kg, SpO2 100 %.  Examination General: Well-developed, well-nourished male in no acute distress; appearance consistent with age of record HENT: normocephalic; 6 cm, horizontal, superficial laceration to occiput Eyes: pupils equal, round and reactive to light; extraocular muscles intact Neck: supple; nontender Heart: regular rate and rhythm Lungs: clear to auscultation bilaterally Abdomen: soft; nondistended; nontender; bowel sounds present Extremities: No deformity; full range of motion Neurologic: Awake, alert and oriented; motor function intact in all extremities and symmetric; no facial droop Skin: Warm and dry Psychiatric: Normal mood and affect   RESULTS  Summary of this visit's results, reviewed by myself:   EKG Interpretation  Date/Time:    Ventricular Rate:    PR Interval:    QRS Duration:   QT Interval:    QTC Calculation:   R Axis:     Text Interpretation:        Laboratory Studies: No results found for this or any previous visit (from the past 24 hour(s)). Imaging Studies: No results found.  ED COURSE and MDM  Nursing notes and initial vitals signs, including pulse oximetry, reviewed.  Vitals:   01/12/18 0601 01/12/18 0615  BP: (!) 177/84   Pulse: 63   Temp: 98.5 F (36.9 C)   SpO2: 100%   Weight:  83.9 kg  Height:  $Remove'5\' 6"'WQFVzYi$  (1.676 m)   Wound is too shallow for primary closure.  We will provide local wound care.  PROCEDURES    ED DIAGNOSES     ICD-10-CM   1. Fall in home, initial encounter W19.XXXA    Y92.009     2. Laceration of scalp, initial encounter S01.01XA        Britt Petroni, Jenny Reichmann, MD 01/12/18 [phone]

## 2018-01-12 NOTE — ED Triage Notes (Addendum)
Pt arrived with EMS from home c/o fall. Patient stated "Im trying to sit but I missed the chair and fell on my back and hit my head in the bed frame. Pt sustained posterior head laceration. Denies LOC. Denies pain.

## 2018-01-13 ENCOUNTER — Ambulatory Visit (INDEPENDENT_AMBULATORY_CARE_PROVIDER_SITE_OTHER): Payer: PPO | Admitting: Medical

## 2018-01-13 ENCOUNTER — Encounter: Payer: Self-pay | Admitting: Medical

## 2018-01-13 VITALS — BP 155/88 | HR 73 | Temp 99.0°F | Resp 16 | Ht 66.0 in | Wt 181.0 lb

## 2018-01-13 DIAGNOSIS — J40 Bronchitis, not specified as acute or chronic: Secondary | ICD-10-CM

## 2018-01-13 DIAGNOSIS — J029 Acute pharyngitis, unspecified: Secondary | ICD-10-CM | POA: Diagnosis not present

## 2018-01-13 DIAGNOSIS — R05 Cough: Secondary | ICD-10-CM

## 2018-01-13 DIAGNOSIS — R059 Cough, unspecified: Secondary | ICD-10-CM

## 2018-01-13 LAB — POCT RAPID STREP A (OFFICE): Rapid Strep A Screen: NEGATIVE

## 2018-01-13 MED ORDER — BENZONATATE 100 MG PO CAPS: 100.0000 mg | ORAL_CAPSULE | Freq: Three times a day (TID) | ORAL | 0 refills | Status: DC | PRN

## 2018-01-13 MED ORDER — AZITHROMYCIN 250 MG PO TABS: ORAL_TABLET | ORAL | 0 refills | Status: DC

## 2018-01-13 MED ORDER — FLUTICASONE PROPIONATE 50 MCG/ACT NA SUSP: 2.0000 | Freq: Every day | NASAL | 1 refills | Status: DC

## 2018-01-13 MED FILL — AZITHROMYCIN 250 MG TABLET: 250 | 5 days supply | Qty: 6 | Fill #0

## 2018-01-13 MED FILL — BENZONATATE 100 MG CAPSULE: 100 | 10 days supply | Qty: 30 | Fill #0

## 2018-01-13 MED FILL — FLUTICASONE PROP 50 MCG SPR: 50 | 30 days supply | Qty: 16 | Fill #0
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
## 2018-01-27 ENCOUNTER — Other Ambulatory Visit: Payer: Self-pay | Admitting: Family Medicine

## 2018-01-27 DIAGNOSIS — F4323 Adjustment disorder with mixed anxiety and depressed mood: Secondary | ICD-10-CM

## 2018-02-01 ENCOUNTER — Other Ambulatory Visit: Payer: Self-pay | Admitting: Family Medicine

## 2018-02-01 DIAGNOSIS — E1122 Type 2 diabetes mellitus with diabetic chronic kidney disease: Secondary | ICD-10-CM

## 2018-02-01 DIAGNOSIS — N184 Chronic kidney disease, stage 4 (severe): Principal | ICD-10-CM

## 2018-02-02 ENCOUNTER — Ambulatory Visit (HOSPITAL_COMMUNITY)
Admission: RE | Admit: 2018-02-02 | Discharge: 2018-02-02 | Disposition: A | Discharge status: Home / Self Care | Payer: PPO | Source: Ambulatory Visit | Attending: Nephrology | Admitting: Nephrology

## 2018-02-02 VITALS — BP 143/64 | HR 62 | Temp 98.3°F | Resp 20

## 2018-02-02 DIAGNOSIS — N184 Chronic kidney disease, stage 4 (severe): Secondary | ICD-10-CM | POA: Insufficient documentation

## 2018-02-02 LAB — IRON AND TIBC
Iron: 131 ug/dL (ref 45–182)
Saturation Ratios: 37 % (ref 17.9–39.5)
TIBC: 357 ug/dL (ref 250–450)
UIBC: 226 ug/dL

## 2018-02-02 LAB — POCT HEMOGLOBIN-HEMACUE: HEMOGLOBIN: 11.8 g/dL — AB (ref 13.0–17.0)

## 2018-02-02 LAB — FERRITIN: FERRITIN: 274 ng/mL (ref 24–336)

## 2018-02-02 MED ORDER — DARBEPOETIN ALFA 100 MCG/0.5ML IJ SOSY
PREFILLED_SYRINGE | INTRAMUSCULAR | Status: AC
  Filled 2018-02-02: qty 0.5

## 2018-02-02 MED ORDER — DARBEPOETIN ALFA 100 MCG/0.5ML IJ SOSY
100.0000 ug | PREFILLED_SYRINGE | INTRAMUSCULAR | Status: DC
  Administered 2018-02-02: 100 ug via SUBCUTANEOUS

## 2018-02-23 ENCOUNTER — Ambulatory Visit (HOSPITAL_COMMUNITY)
Admission: RE | Admit: 2018-02-23 | Discharge: 2018-02-23 | Disposition: A | Discharge status: Home / Self Care | Payer: PPO | Source: Ambulatory Visit | Attending: Nephrology | Admitting: Nephrology

## 2018-02-23 VITALS — BP 148/66 | HR 63 | Temp 98.3°F | Resp 20

## 2018-02-23 DIAGNOSIS — N184 Chronic kidney disease, stage 4 (severe): Secondary | ICD-10-CM | POA: Diagnosis not present

## 2018-02-23 DIAGNOSIS — D631 Anemia in chronic kidney disease: Secondary | ICD-10-CM | POA: Diagnosis not present

## 2018-02-23 LAB — FERRITIN: Ferritin: 189 ng/mL (ref 24–336)

## 2018-02-23 LAB — IRON AND TIBC
Iron: 150 ug/dL (ref 45–182)
SATURATION RATIOS: 43 % — AB (ref 17.9–39.5)
TIBC: 349 ug/dL (ref 250–450)
UIBC: 199 ug/dL

## 2018-02-23 LAB — POCT HEMOGLOBIN-HEMACUE: Hemoglobin: 11.6 g/dL — ABNORMAL LOW (ref 13.0–17.0)

## 2018-02-23 MED ORDER — DARBEPOETIN ALFA 100 MCG/0.5ML IJ SOSY
100.0000 ug | PREFILLED_SYRINGE | INTRAMUSCULAR | Status: DC
  Administered 2018-02-23: 100 ug via SUBCUTANEOUS

## 2018-02-23 MED ORDER — DARBEPOETIN ALFA 100 MCG/0.5ML IJ SOSY
PREFILLED_SYRINGE | INTRAMUSCULAR | Status: AC
  Filled 2018-02-23: qty 0.5

## 2018-02-25 ENCOUNTER — Encounter (HOSPITAL_COMMUNITY): Payer: Self-pay

## 2018-02-25 ENCOUNTER — Ambulatory Visit (INDEPENDENT_AMBULATORY_CARE_PROVIDER_SITE_OTHER): Payer: PPO

## 2018-02-25 ENCOUNTER — Ambulatory Visit (HOSPITAL_COMMUNITY)
Admission: EM | Admit: 2018-02-25 | Discharge: 2018-02-25 | Disposition: A | Discharge status: Home / Self Care | Payer: PPO | Attending: Family Medicine | Admitting: Family Medicine

## 2018-02-25 DIAGNOSIS — S299XXA Unspecified injury of thorax, initial encounter: Secondary | ICD-10-CM | POA: Diagnosis not present

## 2018-02-25 DIAGNOSIS — M25531 Pain in right wrist: Secondary | ICD-10-CM

## 2018-02-25 DIAGNOSIS — S60211A Contusion of right wrist, initial encounter: Secondary | ICD-10-CM | POA: Diagnosis not present

## 2018-02-25 DIAGNOSIS — S2231XA Fracture of one rib, right side, initial encounter for closed fracture: Secondary | ICD-10-CM

## 2018-02-25 DIAGNOSIS — R0789 Other chest pain: Secondary | ICD-10-CM | POA: Diagnosis not present

## 2018-02-25 MED ORDER — OXYCODONE HCL 5 MG PO TABS: 5.0000 mg | ORAL_TABLET | Freq: Four times a day (QID) | ORAL | 0 refills | Status: DC | PRN

## 2018-02-25 NOTE — Discharge Instructions (Signed)
Ice/cold pack over area for 10-15 min twice daily.  OK to take Tylenol 1000 mg (2 extra strength tabs) or 975 mg (3 regular strength tabs) every 6 hours as needed.  Do not drink alcohol, do any illicit/street drugs, drive or do anything that requires alertness while on this medicine.   Take deep breaths.  Wear splint at night and if it helps with daily activities.
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
## 2018-03-07 ENCOUNTER — Encounter (HOSPITAL_COMMUNITY): Payer: Self-pay | Admitting: Emergency Medicine

## 2018-03-07 ENCOUNTER — Ambulatory Visit (HOSPITAL_COMMUNITY)
Admission: EM | Admit: 2018-03-07 | Discharge: 2018-03-07 | Disposition: A | Discharge status: Home / Self Care | Payer: PPO | Attending: Family Medicine | Admitting: Family Medicine

## 2018-03-07 DIAGNOSIS — M546 Pain in thoracic spine: Secondary | ICD-10-CM

## 2018-03-07 DIAGNOSIS — R112 Nausea with vomiting, unspecified: Secondary | ICD-10-CM

## 2018-03-07 LAB — POCT URINALYSIS DIP (DEVICE)
Bilirubin Urine: NEGATIVE
GLUCOSE, UA: 100 mg/dL — AB
Ketones, ur: NEGATIVE mg/dL
LEUKOCYTES UA: NEGATIVE
NITRITE: NEGATIVE
Protein, ur: 100 mg/dL — AB
SPECIFIC GRAVITY, URINE: 1.025 (ref 1.005–1.030)
UROBILINOGEN UA: 0.2 mg/dL (ref 0.0–1.0)
pH: 5.5 (ref 5.0–8.0)

## 2018-03-07 LAB — POCT I-STAT, CHEM 8
BUN: 127 mg/dL — ABNORMAL HIGH (ref 8–23)
CHLORIDE: 110 mmol/L (ref 98–111)
Calcium, Ion: 1.19 mmol/L (ref 1.15–1.40)
Creatinine, Ser: 7.8 mg/dL — ABNORMAL HIGH (ref 0.61–1.24)
GLUCOSE: 122 mg/dL — AB (ref 70–99)
HCT: 29 % — ABNORMAL LOW (ref 39.0–52.0)
HEMOGLOBIN: 9.9 g/dL — AB (ref 13.0–17.0)
Potassium: 4.9 mmol/L (ref 3.5–5.1)
SODIUM: 140 mmol/L (ref 135–145)
TCO2: 19 mmol/L — AB (ref 22–32)

## 2018-03-07 MED ORDER — ONDANSETRON 4 MG PO TBDP: 4.0000 mg | ORAL_TABLET | Freq: Three times a day (TID) | ORAL | 0 refills | Status: DC | PRN

## 2018-03-07 NOTE — ED Triage Notes (Addendum)
PT reports he was seen here last week and told he has a fractured rib and a sprained wrist. PT now has pain over right shoulder blade / upper back.   PT also reports increased nausea lately, has stage 5 kidney disease.

## 2018-03-07 NOTE — Discharge Instructions (Signed)
Your kidney function has decreased compared to prior. You will have to follow up with your kidney doctor in the next 2-3 days for further evaluation needed. I have provided some zofran to help with your nausea/vomiting. If experiencing worsening symptoms, chest pain, shortness of breath, weakness, dizziness, passing out, go to the emergency department for further evaluation needed.
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
## 2018-03-08 DIAGNOSIS — N185 Chronic kidney disease, stage 5: Secondary | ICD-10-CM | POA: Diagnosis not present

## 2018-03-08 DIAGNOSIS — N189 Chronic kidney disease, unspecified: Secondary | ICD-10-CM | POA: Diagnosis not present

## 2018-03-08 DIAGNOSIS — D631 Anemia in chronic kidney disease: Secondary | ICD-10-CM | POA: Diagnosis not present

## 2018-03-08 DIAGNOSIS — N2581 Secondary hyperparathyroidism of renal origin: Secondary | ICD-10-CM | POA: Diagnosis not present

## 2018-03-08 DIAGNOSIS — I120 Hypertensive chronic kidney disease with stage 5 chronic kidney disease or end stage renal disease: Secondary | ICD-10-CM | POA: Diagnosis not present

## 2018-03-15 DIAGNOSIS — N186 End stage renal disease: Secondary | ICD-10-CM | POA: Diagnosis not present

## 2018-03-15 DIAGNOSIS — Z111 Encounter for screening for respiratory tuberculosis: Secondary | ICD-10-CM | POA: Diagnosis not present

## 2018-03-15 DIAGNOSIS — R52 Pain, unspecified: Secondary | ICD-10-CM | POA: Diagnosis not present

## 2018-03-15 DIAGNOSIS — Z23 Encounter for immunization: Secondary | ICD-10-CM | POA: Diagnosis not present

## 2018-03-15 DIAGNOSIS — D631 Anemia in chronic kidney disease: Secondary | ICD-10-CM | POA: Diagnosis not present

## 2018-03-15 DIAGNOSIS — D689 Coagulation defect, unspecified: Secondary | ICD-10-CM | POA: Diagnosis not present

## 2018-03-15 DIAGNOSIS — E1121 Type 2 diabetes mellitus with diabetic nephropathy: Secondary | ICD-10-CM | POA: Diagnosis not present

## 2018-03-15 DIAGNOSIS — N2581 Secondary hyperparathyroidism of renal origin: Secondary | ICD-10-CM | POA: Diagnosis not present

## 2018-03-15 DIAGNOSIS — Z992 Dependence on renal dialysis: Secondary | ICD-10-CM | POA: Diagnosis not present

## 2018-03-16 ENCOUNTER — Ambulatory Visit (HOSPITAL_COMMUNITY)
Admission: RE | Admit: 2018-03-16 | Discharge: 2018-03-16 | Disposition: A | Discharge status: Home / Self Care | Payer: PPO | Source: Ambulatory Visit | Attending: Nephrology | Admitting: Nephrology

## 2018-03-16 VITALS — BP 131/74 | HR 71 | Temp 98.3°F | Resp 20

## 2018-03-16 DIAGNOSIS — D631 Anemia in chronic kidney disease: Secondary | ICD-10-CM | POA: Insufficient documentation

## 2018-03-16 DIAGNOSIS — N184 Chronic kidney disease, stage 4 (severe): Secondary | ICD-10-CM | POA: Diagnosis not present

## 2018-03-16 LAB — POCT HEMOGLOBIN-HEMACUE: Hemoglobin: 9.9 g/dL — ABNORMAL LOW (ref 13.0–17.0)

## 2018-03-16 MED ORDER — DARBEPOETIN ALFA 100 MCG/0.5ML IJ SOSY
PREFILLED_SYRINGE | INTRAMUSCULAR | Status: AC
  Administered 2018-03-16: 100 ug
  Filled 2018-03-16: qty 0.5

## 2018-03-16 MED ORDER — DARBEPOETIN ALFA 100 MCG/0.5ML IJ SOSY: 100.0000 ug | PREFILLED_SYRINGE | INTRAMUSCULAR | Status: DC

## 2018-03-17 ENCOUNTER — Other Ambulatory Visit (HOSPITAL_COMMUNITY): Payer: Self-pay | Admitting: Nephrology

## 2018-03-17 DIAGNOSIS — N186 End stage renal disease: Secondary | ICD-10-CM

## 2018-03-18 ENCOUNTER — Other Ambulatory Visit (HOSPITAL_COMMUNITY): Payer: Self-pay | Admitting: Nephrology

## 2018-03-18 ENCOUNTER — Encounter (HOSPITAL_COMMUNITY): Payer: Self-pay | Admitting: Interventional Radiology

## 2018-03-18 ENCOUNTER — Emergency Department (HOSPITAL_COMMUNITY): Admission: EM | Admit: 2018-03-18 | Payer: PPO | Source: Home / Self Care

## 2018-03-18 ENCOUNTER — Ambulatory Visit (HOSPITAL_COMMUNITY)
Admission: RE | Admit: 2018-03-18 | Discharge: 2018-03-18 | Disposition: A | Discharge status: Home / Self Care | Payer: PPO | Source: Ambulatory Visit | Attending: Nephrology | Admitting: Nephrology

## 2018-03-18 ENCOUNTER — Other Ambulatory Visit: Payer: Self-pay

## 2018-03-18 DIAGNOSIS — Z9889 Other specified postprocedural states: Secondary | ICD-10-CM | POA: Insufficient documentation

## 2018-03-18 DIAGNOSIS — Z4901 Encounter for fitting and adjustment of extracorporeal dialysis catheter: Secondary | ICD-10-CM | POA: Diagnosis not present

## 2018-03-18 DIAGNOSIS — N186 End stage renal disease: Secondary | ICD-10-CM | POA: Diagnosis not present

## 2018-03-18 DIAGNOSIS — Z992 Dependence on renal dialysis: Secondary | ICD-10-CM | POA: Insufficient documentation

## 2018-03-18 HISTORY — PX: IR US GUIDE VASC ACCESS RIGHT: IMG2390

## 2018-03-18 HISTORY — PX: IR FLUORO GUIDE CV LINE RIGHT: IMG2283

## 2018-03-18 MED ORDER — HEPARIN SODIUM (PORCINE) 1000 UNIT/ML IJ SOLN
INTRAMUSCULAR | Status: AC
  Administered 2018-03-18: 3.2 mL
  Filled 2018-03-18: qty 1

## 2018-03-18 MED ORDER — LIDOCAINE-EPINEPHRINE (PF) 2 %-1:200000 IJ SOLN
INTRAMUSCULAR | Status: AC | PRN
  Administered 2018-03-18: 10 mL

## 2018-03-18 MED ORDER — CEFAZOLIN SODIUM-DEXTROSE 2-4 GM/100ML-% IV SOLN
INTRAVENOUS | Status: AC
  Filled 2018-03-18: qty 100

## 2018-03-18 MED ORDER — LIDOCAINE-EPINEPHRINE (PF) 1 %-1:200000 IJ SOLN
INTRAMUSCULAR | Status: AC
  Filled 2018-03-18: qty 30

## 2018-03-18 MED ORDER — FENTANYL CITRATE (PF) 100 MCG/2ML IJ SOLN
INTRAMUSCULAR | Status: AC | PRN
  Administered 2018-03-18 (×3): 25 ug via INTRAVENOUS

## 2018-03-18 MED ORDER — CEFAZOLIN SODIUM-DEXTROSE 2-4 GM/100ML-% IV SOLN
2.0000 g | Freq: Once | INTRAVENOUS | Status: AC
  Administered 2018-03-18: 2 g via INTRAVENOUS

## 2018-03-18 MED ORDER — SODIUM CHLORIDE 0.9 % IV SOLN
INTRAVENOUS | Status: AC | PRN
  Administered 2018-03-18: 10 mL/h via INTRAVENOUS

## 2018-03-18 MED ORDER — MIDAZOLAM HCL 2 MG/2ML IJ SOLN
INTRAMUSCULAR | Status: AC
  Filled 2018-03-18: qty 2

## 2018-03-18 MED ORDER — FENTANYL CITRATE (PF) 100 MCG/2ML IJ SOLN
INTRAMUSCULAR | Status: AC
  Filled 2018-03-18: qty 2

## 2018-03-18 MED ORDER — MIDAZOLAM HCL 2 MG/2ML IJ SOLN
INTRAMUSCULAR | Status: AC | PRN
  Administered 2018-03-18: 1 mg via INTRAVENOUS
  Administered 2018-03-18: 0.5 mg via INTRAVENOUS

## 2018-03-18 NOTE — Discharge Instructions (Addendum)
Vascular Access for Hemodialysis A vascular access is a connection between two blood vessels that allows blood to be easily removed from the body and returned to the body during hemodialysis. Hemodialysis is a procedure in which a machine outside of the body filters the blood. There are three types of vascular accesses:  Arteriovenous fistula. This is a connection between an artery and a vein (usually in the arm) that is made by sewing them together. Blood in the artery flows directly into the vein, causing it to get larger over time. This makes it easier for the vein to be used for hemodialysis. An arteriovenous fistula takes 1-6 months to develop after surgery.  Arteriovenous graft. This is a connection between an artery and a vein in the arm that is made with a tube. An arteriovenous graft can be used within 2-3 weeks of surgery.  Venous catheter. This is a thin, flexible tube that is placed in a large vein (usually in the neck, chest, or groin). A venous catheter for hemodialysis contains two tubes that come out of the skin. A venous catheter can be used right away. It is usually used as a temporary access if you need hemodialysis before a fistula or graft has developed. It may also be used as a permanent access if a fistula or graft cannot be created.  Which type of access is best for me? The type of access that is best for you depends on the size and strength of your veins. A fistula is usually the preferred type of access. It can last several years and is less likely than the other types of accesses to become infected or to cause blood clots within a blood vessel (thrombosis). However, a fistula is not an option for everyone. If your veins are not the right size, a graft may be used instead. Grafts require you to have strong veins. If your veins are not strong enough for a graft, a catheter may be used. Catheters are more likely than fistulas and grafts to become infected or to have  thrombosis. Sometimes, only one type of access is an option. Your health care provider will help you determine which type of access is best for you. How is a vascular access used? The way the access is used depends on the type of access:  If the access is a fistula or graft, two needles are inserted through the skin into the access before each hemodialysis session. Blood leaves the body through one of the needles and travels through a tube to the hemodialysis machine (dialyzer). It then flows through another tube and returns to the body through the second needle.  If the access is a catheter, one tube is connected directly to the tube that leads to the dialyzer and the other is connected to a tube that leads away from the dialyzer. Blood leaves the body through one tube and returns to the body through the other.  What kind of problems can occur with vascular accesses?  Blood clots within a blood vessel (thrombosis). Thrombosis can lead to a narrowing of a blood vessel or tube (stenosis). If thrombosis occurs frequently, another access site may be created as a backup.  Infection. These problems are most likely to occur with a venous catheter and least likely to occur with an arteriovenous fistula. How do I care for my vascular access? Wear a medical alert bracelet. This tells health care providers that you are a dialysis patient in the case of an emergency and  allows them to care for your veins appropriately. If you have a graft or fistula:  A "bruit" is a noise that is heard with a stethoscope and a "thrill" is a vibration felt over the graft or fistula. The presence of the bruit and thrill indicates that the access is working. You will be taught to feel for the thrill each day. If this is not felt, the access may be clotted. Call your health care provider.  You may use the arm where your vascular access is located freely after the site heals. Keep the following in mind: ? Avoid pressure on the  arm. ? Avoid lifting heavy objects with the arm. ? Avoid sleeping on the arm. ? Avoid wearing tight-sleeved shirts or jewelry around the graft or fistula.  Do not allow blood pressure monitoring or needle punctures on the side where the graft or fistula is located.  With permission from your health care provider, you may do exercises to help with blood flow through a fistula. These exercises involve squeezing a rubber ball or other soft objects as instructed.  Contact a health care provider if:  Chills develop.  You have an oral temperature above 102 F (38.9 C).  Swelling around the graft or fistula gets worse.  New pain develops.  Pus or other fluid (drainage) is seen at the vascular access site.  Skin redness or red streaking is seen on the skin around, above, or below the vascular access. Get help right away if:  Pain, numbness, or an unusual pale skin color develops in the hand on the side of your fistula.  Dizziness or weakness develops that you have not had before.  The vascular access has bleeding that cannot be easily controlled. This information is not intended to replace advice given to you by your health care provider. Make sure you discuss any questions you have with your health care provider. Document Released: 07/31/2002 Document Revised: 10/16/2015 Document Reviewed: 09/26/2012 Elsevier Interactive Patient Education  2017 [employer]. Moderate Conscious Sedation, Adult, Care After These instructions provide you with information about caring for yourself after your procedure. Your health care provider may also give you more specific instructions. Your treatment has been planned according to current medical practices, but problems sometimes occur. Call your health care provider if you have any problems or questions after your procedure. What can I expect after the procedure? After your procedure, it is common:  To feel sleepy for several hours.  To feel clumsy  and have poor balance for several hours.  To have poor judgment for several hours.  To vomit if you eat too soon.  Follow these instructions at home: For at least 24 hours after the procedure:   Do not: ? Participate in activities where you could fall or become injured. ? Drive. ? Use heavy machinery. ? Drink alcohol. ? Take sleeping pills or medicines that cause drowsiness. ? Make important decisions or sign legal documents. ? Take care of children on your own.  Rest. Eating and drinking  Follow the diet recommended by your health care provider.  If you vomit: ? Drink water, juice, or soup when you can drink without vomiting. ? Make sure you have little or no nausea before eating solid foods. General instructions  Have a responsible adult stay with you until you are awake and alert.  Take over-the-counter and prescription medicines only as told by your health care provider.  If you smoke, do not smoke without supervision.  Keep all follow-up  visits as told by your health care provider. This is important. Contact a health care provider if:  You keep feeling nauseous or you keep vomiting.  You feel light-headed.  You develop a rash.  You have a fever. Get help right away if:  You have trouble breathing. This information is not intended to replace advice given to you by your health care provider. Make sure you discuss any questions you have with your health care provider. Document Released: 02/28/2013 Document Revised: 10/13/2015 Document Reviewed: 08/30/2015 Elsevier Interactive Patient Education  [employer].

## 2018-03-18 NOTE — H&P (Signed)
Chief Complaint: Patient was seen in consultation today for ESRD.  Referring Physician(s): Coladonato,Joseph  Supervising Physician: Sandi Mariscal  Patient Status: [facility] - Out-pt  History of Present Illness: Scott Jones is a 72 y.o. male with a past medical history of ESRD. He had a LUE fistula/graft placed in 03/2017 with anticipation patient will begin dialysis. He had this fistula/graft accessed on 03/16/2018 and 03/17/2018 with little or no return.  IR requested by Dr. Marval Regal for possible image-guided tunneled HD catheter placement. Patient awake and alert laying in bed with no complaints at this time. Accompanied by wife at bedside. Denies fever, chills, chest pain, dyspnea, abdominal pain, dizziness, or headache.    No past medical history on file.   Allergies: Patient has no known allergies.  Medications: Prior to Admission medications   Not on File     No family history on file.  Social History   Socioeconomic History  . Marital status: Married    Spouse name: Not on file  . Number of children: Not on file  . Years of education: Not on file  . Highest education level: Not on file  Occupational History  . Not on file  Social Needs  . Financial resource strain: Not on file  . Food insecurity:    Worry: Not on file    Inability: Not on file  . Transportation needs:    Medical: Not on file    Non-medical: Not on file  Tobacco Use  . Smoking status: Not on file  Substance and Sexual Activity  . Alcohol use: Not on file  . Drug use: Not on file  . Sexual activity: Not on file  Lifestyle  . Physical activity:    Days per week: Not on file    Minutes per session: Not on file  . Stress: Not on file  Relationships  . Social connections:    Talks on phone: Not on file    Gets together: Not on file    Attends religious service: Not on file    Active member of club or organization: Not on file    Attends meetings of clubs or organizations: Not  on file    Relationship status: Not on file  Other Topics Concern  . Not on file  Social History Narrative  . Not on file     Review of Systems: A 12 point ROS discussed and pertinent positives are indicated in the HPI above.  All other systems are negative.  Review of Systems  Constitutional: Negative for chills and fever.  Respiratory: Negative for shortness of breath and wheezing.   Cardiovascular: Negative for chest pain and palpitations.  Gastrointestinal: Negative for abdominal pain.  Neurological: Negative for dizziness and headaches.  Psychiatric/Behavioral: Negative for behavioral problems and confusion.    Vital Signs: There were no vitals taken for this visit.  Physical Exam  Constitutional: He is oriented to person, place, and time. He appears well-developed and well-nourished. No distress.  Cardiovascular: Normal rate, regular rhythm and normal heart sounds.  No murmur heard. LUE fistula with mild ecchymosis, no tenderness or erythema, no thrill palpable, no bruit heard on ascultation.  Pulmonary/Chest: Effort normal and breath sounds normal. No respiratory distress. He has no wheezes.  Neurological: He is alert and oriented to person, place, and time.  Skin: Skin is warm.  Psychiatric: He has a normal mood and affect. His behavior is normal. Judgment and thought content normal.  Nursing note and vitals reviewed.  MD Evaluation Airway: WNL Heart: WNL Abdomen: WNL Chest/ Lungs: WNL ASA  Classification: 3 Mallampati/Airway Score: Two   Imaging: No results found.  Labs:  CBC: No results for input(s): WBC, HGB, HCT, PLT in the last 8760 hours.  COAGS: No results for input(s): INR, APTT in the last 8760 hours.  BMP: No results for input(s): NA, K, CL, CO2, GLUCOSE, BUN, CALCIUM, CREATININE, GFRNONAA, GFRAA in the last 8760 hours.  Invalid input(s): CMP  LIVER FUNCTION TESTS: No results for input(s): BILITOT, AST, ALT, ALKPHOS, PROT, ALBUMIN in  the last 8760 hours.  TUMOR MARKERS: No results for input(s): AFPTM, CEA, CA199, CHROMGRNA in the last 8760 hours.  Assessment and Plan:  ESRD. Plan for image-guided tunneled HD catheter placement today with Dr. Pascal Lux. Patient is NPO. Afebrile. He does not take blood thinners.  Risks and benefits discussed with the patient including, but not limited to bleeding, infection, vascular injury, pneumothorax which may require chest tube placement, air embolism or even death. All of the patient's questions were answered, patient is agreeable to proceed. Consent signed and in chart.   Thank you for this interesting consult.  I greatly enjoyed meeting Scott Jones and look forward to participating in their care.  A copy of this report was sent to the requesting provider on this date.  Electronically Signed: Earley Abide, PA-C 03/18/2018, 1:00 PM   I spent a total of 30 Minutes in face to face in clinical consultation, greater than 50% of which was counseling/coordinating care for ESRD.

## 2018-03-18 NOTE — Sedation Documentation (Signed)
Tolerated PO well. D/C home in wheelchair with wife. Awake and alert. In no distress. D/c instructions given

## 2018-03-18 NOTE — Procedures (Signed)
Pre-procedure Diagnosis: ESRD Post-procedure Diagnosis: Same  Successful placement of tunneled HD catheter with tips terminating within the superior aspect of the right atrium.    Complications: None Immediate  EBL: Minimal   The catheter is ready for immediate use.   Jay Don Giarrusso, MD Pager #: [phone]   

## 2018-03-20 ENCOUNTER — Encounter (HOSPITAL_COMMUNITY): Payer: Self-pay | Admitting: Emergency Medicine

## 2018-03-23 DIAGNOSIS — Z992 Dependence on renal dialysis: Secondary | ICD-10-CM | POA: Diagnosis not present

## 2018-03-23 DIAGNOSIS — I129 Hypertensive chronic kidney disease with stage 1 through stage 4 chronic kidney disease, or unspecified chronic kidney disease: Secondary | ICD-10-CM | POA: Diagnosis not present

## 2018-03-23 DIAGNOSIS — N186 End stage renal disease: Secondary | ICD-10-CM | POA: Diagnosis not present

## 2018-03-24 DIAGNOSIS — B999 Unspecified infectious disease: Secondary | ICD-10-CM

## 2018-03-24 DIAGNOSIS — E1121 Type 2 diabetes mellitus with diabetic nephropathy: Secondary | ICD-10-CM | POA: Diagnosis not present

## 2018-03-24 DIAGNOSIS — D631 Anemia in chronic kidney disease: Secondary | ICD-10-CM | POA: Diagnosis not present

## 2018-03-24 DIAGNOSIS — Z23 Encounter for immunization: Secondary | ICD-10-CM | POA: Diagnosis not present

## 2018-03-24 DIAGNOSIS — D689 Coagulation defect, unspecified: Secondary | ICD-10-CM | POA: Diagnosis not present

## 2018-03-24 DIAGNOSIS — N186 End stage renal disease: Secondary | ICD-10-CM | POA: Diagnosis not present

## 2018-03-24 DIAGNOSIS — N2581 Secondary hyperparathyroidism of renal origin: Secondary | ICD-10-CM | POA: Diagnosis not present

## 2018-03-24 DIAGNOSIS — Z992 Dependence on renal dialysis: Secondary | ICD-10-CM | POA: Diagnosis not present

## 2018-03-24 HISTORY — DX: Unspecified infectious disease: B99.9

## 2018-04-03 ENCOUNTER — Encounter (HOSPITAL_COMMUNITY): Payer: Self-pay | Admitting: Emergency Medicine

## 2018-04-03 ENCOUNTER — Emergency Department (HOSPITAL_COMMUNITY): Payer: PPO

## 2018-04-03 ENCOUNTER — Inpatient Hospital Stay (HOSPITAL_COMMUNITY)
Admission: EM | Admit: 2018-04-03 | Discharge: 2018-04-06 | DRG: 314 | Disposition: A | Discharge status: Home / Self Care | Payer: PPO | Source: Ambulatory Visit | Attending: Family Medicine | Admitting: Family Medicine

## 2018-04-03 ENCOUNTER — Ambulatory Visit (HOSPITAL_COMMUNITY)
Admission: EM | Admit: 2018-04-03 | Discharge: 2018-04-03 | Disposition: A | Discharge status: Home / Self Care | Payer: PPO | Source: Home / Self Care | Attending: Family Medicine | Admitting: Family Medicine

## 2018-04-03 DIAGNOSIS — Z794 Long term (current) use of insulin: Secondary | ICD-10-CM | POA: Diagnosis not present

## 2018-04-03 DIAGNOSIS — Z8619 Personal history of other infectious and parasitic diseases: Secondary | ICD-10-CM | POA: Diagnosis not present

## 2018-04-03 DIAGNOSIS — R509 Fever, unspecified: Secondary | ICD-10-CM | POA: Diagnosis not present

## 2018-04-03 DIAGNOSIS — Z79899 Other long term (current) drug therapy: Secondary | ICD-10-CM | POA: Diagnosis not present

## 2018-04-03 DIAGNOSIS — Z905 Acquired absence of kidney: Secondary | ICD-10-CM | POA: Diagnosis not present

## 2018-04-03 DIAGNOSIS — D631 Anemia in chronic kidney disease: Secondary | ICD-10-CM | POA: Diagnosis not present

## 2018-04-03 DIAGNOSIS — F329 Major depressive disorder, single episode, unspecified: Secondary | ICD-10-CM | POA: Diagnosis not present

## 2018-04-03 DIAGNOSIS — E1122 Type 2 diabetes mellitus with diabetic chronic kidney disease: Secondary | ICD-10-CM | POA: Diagnosis not present

## 2018-04-03 DIAGNOSIS — E119 Type 2 diabetes mellitus without complications: Secondary | ICD-10-CM

## 2018-04-03 DIAGNOSIS — L03113 Cellulitis of right upper limb: Secondary | ICD-10-CM | POA: Diagnosis present

## 2018-04-03 DIAGNOSIS — Z7982 Long term (current) use of aspirin: Secondary | ICD-10-CM

## 2018-04-03 DIAGNOSIS — N186 End stage renal disease: Secondary | ICD-10-CM | POA: Diagnosis not present

## 2018-04-03 DIAGNOSIS — I10 Essential (primary) hypertension: Secondary | ICD-10-CM | POA: Diagnosis not present

## 2018-04-03 DIAGNOSIS — T82848A Pain from vascular prosthetic devices, implants and grafts, initial encounter: Secondary | ICD-10-CM | POA: Diagnosis not present

## 2018-04-03 DIAGNOSIS — Z87891 Personal history of nicotine dependence: Secondary | ICD-10-CM

## 2018-04-03 DIAGNOSIS — Z992 Dependence on renal dialysis: Secondary | ICD-10-CM

## 2018-04-03 DIAGNOSIS — J45909 Unspecified asthma, uncomplicated: Secondary | ICD-10-CM | POA: Diagnosis not present

## 2018-04-03 DIAGNOSIS — E785 Hyperlipidemia, unspecified: Secondary | ICD-10-CM | POA: Diagnosis present

## 2018-04-03 DIAGNOSIS — T827XXA Infection and inflammatory reaction due to other cardiac and vascular devices, implants and grafts, initial encounter: Secondary | ICD-10-CM | POA: Diagnosis not present

## 2018-04-03 DIAGNOSIS — Y832 Surgical operation with anastomosis, bypass or graft as the cause of abnormal reaction of the patient, or of later complication, without mention of misadventure at the time of the procedure: Secondary | ICD-10-CM | POA: Diagnosis present

## 2018-04-03 DIAGNOSIS — I120 Hypertensive chronic kidney disease with stage 5 chronic kidney disease or end stage renal disease: Secondary | ICD-10-CM | POA: Diagnosis present

## 2018-04-03 HISTORY — DX: Unspecified infectious disease: B99.9

## 2018-04-03 LAB — URINALYSIS, ROUTINE W REFLEX MICROSCOPIC
BACTERIA UA: NONE SEEN
BILIRUBIN URINE: NEGATIVE
GLUCOSE, UA: NEGATIVE mg/dL
HGB URINE DIPSTICK: NEGATIVE
KETONES UR: NEGATIVE mg/dL
LEUKOCYTES UA: NEGATIVE
NITRITE: NEGATIVE
PROTEIN: 100 mg/dL — AB
Specific Gravity, Urine: 1.012 (ref 1.005–1.030)
pH: 8 (ref 5.0–8.0)

## 2018-04-03 LAB — CBC WITH DIFFERENTIAL/PLATELET
ABS IMMATURE GRANULOCYTES: 0.02 10*3/uL (ref 0.00–0.07)
BASOS PCT: 0 %
Basophils Absolute: 0 10*3/uL (ref 0.0–0.1)
Eosinophils Absolute: 0.1 10*3/uL (ref 0.0–0.5)
Eosinophils Relative: 1 %
HCT: 34.3 % — ABNORMAL LOW (ref 39.0–52.0)
HEMOGLOBIN: 10.8 g/dL — AB (ref 13.0–17.0)
Immature Granulocytes: 0 %
Lymphocytes Relative: 6 %
Lymphs Abs: 0.6 10*3/uL — ABNORMAL LOW (ref 0.7–4.0)
MCH: 33.9 pg (ref 26.0–34.0)
MCHC: 31.5 g/dL (ref 30.0–36.0)
MCV: 107.5 fL — ABNORMAL HIGH (ref 80.0–100.0)
MONO ABS: 0.8 10*3/uL (ref 0.1–1.0)
MONOS PCT: 8 %
NEUTROS ABS: 8.1 10*3/uL — AB (ref 1.7–7.7)
Neutrophils Relative %: 85 %
PLATELETS: 192 10*3/uL (ref 150–400)
RBC: 3.19 MIL/uL — ABNORMAL LOW (ref 4.22–5.81)
RDW: 13.4 % (ref 11.5–15.5)
WBC: 9.5 10*3/uL (ref 4.0–10.5)
nRBC: 0 % (ref 0.0–0.2)

## 2018-04-03 LAB — COMPREHENSIVE METABOLIC PANEL
ALK PHOS: 67 U/L (ref 38–126)
ALT: 6 U/L (ref 0–44)
ANION GAP: 12 (ref 5–15)
AST: 23 U/L (ref 15–41)
Albumin: 3.5 g/dL (ref 3.5–5.0)
BUN: 19 mg/dL (ref 8–23)
CALCIUM: 9 mg/dL (ref 8.9–10.3)
CHLORIDE: 94 mmol/L — AB (ref 98–111)
CO2: 31 mmol/L (ref 22–32)
Creatinine, Ser: 3.6 mg/dL — ABNORMAL HIGH (ref 0.61–1.24)
GFR calc non Af Amer: 15 mL/min — ABNORMAL LOW (ref >60)
GFR, EST AFRICAN AMERICAN: 18 mL/min — AB (ref >60)
Glucose, Bld: 180 mg/dL — ABNORMAL HIGH (ref 70–99)
Potassium: 3.8 mmol/L (ref 3.5–5.1)
SODIUM: 137 mmol/L (ref 135–145)
Total Bilirubin: 0.8 mg/dL (ref 0.3–1.2)
Total Protein: 7.5 g/dL (ref 6.5–8.1)

## 2018-04-03 LAB — I-STAT CG4 LACTIC ACID, ED
Lactic Acid, Venous: 1.35 mmol/L (ref 0.5–1.9)
Lactic Acid, Venous: 1.08 mmol/L (ref 0.5–1.9)

## 2018-04-03 LAB — CBG MONITORING, ED: Glucose-Capillary: 144 mg/dL — ABNORMAL HIGH (ref 70–99)

## 2018-04-03 LAB — PROTIME-INR
INR: 0.97
Prothrombin Time: 12.8 seconds (ref 11.4–15.2)

## 2018-04-03 MED ORDER — SERTRALINE HCL 100 MG PO TABS
200.0000 mg | ORAL_TABLET | Freq: Every day | ORAL | Status: DC
  Administered 2018-04-04 – 2018-04-06 (×3): 200 mg via ORAL
  Filled 2018-04-03 (×4): qty 2

## 2018-04-03 MED ORDER — NIFEDIPINE ER OSMOTIC RELEASE 90 MG PO TB24
90.0000 mg | ORAL_TABLET | Freq: Every day | ORAL | Status: DC
  Administered 2018-04-04 – 2018-04-05 (×2): 90 mg via ORAL
  Filled 2018-04-03 (×4): qty 1

## 2018-04-03 MED ORDER — SODIUM CHLORIDE 0.9 % IV SOLN
1.0000 g | Freq: Every day | INTRAVENOUS | Status: DC
  Administered 2018-04-03 – 2018-04-04 (×2): 1 g via INTRAVENOUS
  Filled 2018-04-03 (×2): qty 10

## 2018-04-03 MED ORDER — VANCOMYCIN HCL IN DEXTROSE 1-5 GM/200ML-% IV SOLN
1000.0000 mg | Freq: Once | INTRAVENOUS | Status: AC
  Administered 2018-04-03: 1000 mg via INTRAVENOUS
  Filled 2018-04-03: qty 200

## 2018-04-03 MED ORDER — INSULIN ASPART 100 UNIT/ML ~~LOC~~ SOLN
0.0000 [IU] | SUBCUTANEOUS | Status: DC
  Administered 2018-04-03: 1 [IU] via SUBCUTANEOUS
  Administered 2018-04-04: 2 [IU] via SUBCUTANEOUS
  Administered 2018-04-05 (×2): 1 [IU] via SUBCUTANEOUS
  Administered 2018-04-05: 2 [IU] via SUBCUTANEOUS
  Administered 2018-04-06: 1 [IU] via SUBCUTANEOUS
  Filled 2018-04-03: qty 1

## 2018-04-03 MED ORDER — VANCOMYCIN HCL 500 MG IV SOLR
500.0000 mg | Freq: Once | INTRAVENOUS | Status: AC
  Administered 2018-04-04: 500 mg via INTRAVENOUS
  Filled 2018-04-03: qty 500

## 2018-04-03 MED ORDER — ACETAMINOPHEN 650 MG RE SUPP: 650.0000 mg | Freq: Four times a day (QID) | RECTAL | Status: DC | PRN

## 2018-04-03 MED ORDER — FENTANYL CITRATE (PF) 100 MCG/2ML IJ SOLN
25.0000 ug | INTRAMUSCULAR | Status: DC | PRN
  Administered 2018-04-04: 50 ug via INTRAVENOUS
  Administered 2018-04-04: 25 ug via INTRAVENOUS
  Administered 2018-04-04: 50 ug via INTRAVENOUS
  Administered 2018-04-05 (×2): 25 ug via INTRAVENOUS
  Filled 2018-04-03 (×5): qty 2

## 2018-04-03 MED ORDER — ONDANSETRON HCL 4 MG/2ML IJ SOLN: 4.0000 mg | Freq: Four times a day (QID) | INTRAMUSCULAR | Status: DC | PRN

## 2018-04-03 MED ORDER — ACETAMINOPHEN 325 MG PO TABS
650.0000 mg | ORAL_TABLET | Freq: Four times a day (QID) | ORAL | Status: DC | PRN
  Administered 2018-04-04 – 2018-04-06 (×4): 650 mg via ORAL
  Filled 2018-04-03 (×4): qty 2

## 2018-04-03 MED ORDER — ONDANSETRON HCL 4 MG PO TABS: 4.0000 mg | ORAL_TABLET | Freq: Four times a day (QID) | ORAL | Status: DC | PRN

## 2018-04-03 MED ORDER — DOCUSATE SODIUM 100 MG PO CAPS: 100.0000 mg | ORAL_CAPSULE | Freq: Every day | ORAL | Status: DC | PRN

## 2018-04-03 MED ORDER — SIMVASTATIN 40 MG PO TABS
40.0000 mg | ORAL_TABLET | Freq: Every day | ORAL | Status: DC
  Administered 2018-04-04 – 2018-04-06 (×3): 40 mg via ORAL
  Filled 2018-04-03 (×4): qty 1

## 2018-04-03 MED ORDER — VANCOMYCIN HCL IN DEXTROSE 750-5 MG/150ML-% IV SOLN
750.0000 mg | INTRAVENOUS | Status: DC
  Filled 2018-04-03: qty 150

## 2018-04-03 MED ORDER — ALLOPURINOL 100 MG PO TABS
100.0000 mg | ORAL_TABLET | Freq: Every day | ORAL | Status: DC
  Administered 2018-04-04 – 2018-04-06 (×3): 100 mg via ORAL
  Filled 2018-04-03 (×4): qty 1

## 2018-04-03 MED ORDER — FENTANYL CITRATE (PF) 100 MCG/2ML IJ SOLN
50.0000 ug | Freq: Once | INTRAMUSCULAR | Status: AC
  Administered 2018-04-03: 50 ug via INTRAVENOUS
  Filled 2018-04-03: qty 2

## 2018-04-03 NOTE — Consult Note (Signed)
Referring Physician: Howard  Patient name: Scott Jones MRN: [id number] DOB: 08-01-1945 Sex: male  REASON FOR CONSULT: rule out graft infection  HPI: Atthew Coutant is a 72 y.o. male, POD 4 from placement of a right forearm AV graft at the [facility] in [place].  Pt has had pain in the arm since discharge but it got worse today and he was noted to have a fever in the ER of 101.  He also has a tunneled dialysis catheter that was placed by interventional radiology here 16 days ago.  His HD is MWF.  He has had no incisonal drainage from the forearm graft.  He has no numbness or tingling in his hand.  Other medical problems include diabetes, hepatitis C and hypertension which are all controlled.  Past Medical History:  Diagnosis Date  . Allergy   . Asthma   . Chronic kidney disease   . Depression   . Diabetes mellitus   . Hepatitis C   . Hypertension    Past Surgical History:  Procedure Laterality Date  . AV FISTULA PLACEMENT    . IR FLUORO GUIDE CV LINE RIGHT  03/18/2018  . IR US GUIDE VASC ACCESS RIGHT  03/18/2018  . NEPHRECTOMY     partial - benign tumor  . PARTIAL NEPHRECTOMY  10/08/2011   Left kidney    Family History  Problem Relation Age of Onset  . Diabetes Mother   . Hypertension Mother   . COPD Mother   . Asthma Mother   . Diabetes Father   . Diabetes Sister   . Diabetes Brother   . Kidney disease Brother     SOCIAL HISTORY: Social History   Socioeconomic History  . Marital status: Married    Spouse name: Not on file  . Number of children: Not on file  . Years of education: Not on file  . Highest education level: Not on file  Occupational History  . Not on file  Social Needs  . Financial resource strain: Not on file  . Food insecurity:    Worry: Not on file    Inability: Not on file  . Transportation needs:    Medical: Not on file    Non-medical: Not on file  Tobacco Use  . Smoking status: Former Smoker    Packs/day: 1.00    Years: 11.00      Pack years: 11.00    Types: Cigarettes, Pipe    Last attempt to quit: 08/07/1974    Years since quitting: 43.6  . Smokeless tobacco: Never Used  Substance and Sexual Activity  . Alcohol use: Yes    Alcohol/week: 1.0 standard drinks    Types: 1 Cans of beer per week    Comment: 1 glass wine every few weeks.  . Drug use: Yes    Types: Marijuana    Comment: Marijuana cookie/candy 1x monthly  . Sexual activity: Yes    Birth control/protection: None  Lifestyle  . Physical activity:    Days per week: Not on file    Minutes per session: Not on file  . Stress: Not on file  Relationships  . Social connections:    Talks on phone: Not on file    Gets together: Not on file    Attends religious service: Not on file    Active member of club or organization: Not on file    Attends meetings of clubs or organizations: Not on file    Relationship  status: Not on file  . Intimate partner violence:    Fear of current or ex partner: Not on file    Emotionally abused: Not on file    Physically abused: Not on file    Forced sexual activity: Not on file  Other Topics Concern  . Not on file  Social History Narrative   ** Merged History Encounter **        No Known Allergies  Current Facility-Administered Medications  Medication Dose Route Frequency Provider Last Rate Last Dose  . vancomycin (VANCOCIN) IVPB 1000 mg/200 mL premix  1,000 mg Intravenous Once Rozier, Richard T, MD 200 mL/hr at 04/03/18 2231 1,000 mg at 04/03/18 2231   Current Outpatient Medications  Medication Sig Dispense Refill  . allopurinol (ZYLOPRIM) 100 MG tablet TAKE 1 TABLET BY MOUTH ONCE DAILY (Patient taking differently: Take 100 mg by mouth daily. ) 90 tablet 3  . aspirin 81 MG tablet Take 81 mg by mouth daily.    . calcium carbonate (TUMS - DOSED IN MG ELEMENTAL CALCIUM) 500 MG chewable tablet Chew 1 tablet by mouth 3 (three) times daily with meals.     . docusate sodium (COLACE) 100 MG capsule Take 100 mg by mouth  daily as needed for mild constipation.     . fluticasone (FLONASE) 50 MCG/ACT nasal spray Place 2 sprays into both nostrils daily. (Patient taking differently: Place 2 sprays into both nostrils daily as needed for allergies. ) 16 g 1  . folic acid-vitamin b complex-vitamin c-selenium-zinc (DIALYVITE) 3 MG TABS tablet Take 1 tablet by mouth daily.    . Homeopathic Products (LEG CRAMP RELIEF PO) Take 1 tablet by mouth daily as needed (leg cramps).     Marland Kitchen lisinopril (PRINIVIL,ZESTRIL) 40 MG tablet TAKE 1 TABLET BY MOUTH ONCE DAILY (Patient taking differently: Take 40 mg by mouth daily. ) 90 tablet 1  . NIFEdipine (ADALAT CC) 90 MG 24 hr tablet TAKE 1 TABLET BY MOUTH ONCE DAILY (Patient taking differently: Take 90 mg by mouth daily. ) 90 tablet 1  . ondansetron (ZOFRAN ODT) 4 MG disintegrating tablet Take 1 tablet (4 mg total) by mouth every 8 (eight) hours as needed for nausea or vomiting. 10 tablet 0  . oxyCODONE (OXY IR/ROXICODONE) 5 MG immediate release tablet Take 1 tablet (5 mg total) by mouth every 6 (six) hours as needed for severe pain. 15 tablet 0  . pioglitazone (ACTOS) 30 MG tablet TAKE 1 TABLET BY MOUTH ONCE DAILY (Patient taking differently: Take 30 mg by mouth daily. ) 90 tablet 1  . sertraline (ZOLOFT) 100 MG tablet TAKE 2 TABLETS BY MOUTH ONCE DAILY (Patient taking differently: Take 200 mg by mouth daily. ) 180 tablet 3  . simvastatin (ZOCOR) 40 MG tablet TAKE 1 TABLET BY MOUTH ONCE DAILY (Patient taking differently: Take 40 mg by mouth every morning. ) 90 tablet 1  . azithromycin (ZITHROMAX) 250 MG tablet Take 2 tablets by mouth on day 1, followed by 1 tablet by mouth daily for 4 days. (Patient not taking: Reported on 04/03/2018) 6 tablet 0  . benzonatate (TESSALON) 100 MG capsule Take 1 capsule (100 mg total) by mouth 3 (three) times daily as needed for cough. (Patient not taking: Reported on 04/03/2018) 30 capsule 0  . fluticasone (FLONASE) 50 MCG/ACT nasal spray Place 2 sprays into both  nostrils daily. (Patient not taking: Reported on 04/03/2018) 16 g 1  . furosemide (LASIX) 40 MG tablet Take 2 tablets in the morning and 1  in the afternoon (Patient not taking: Reported on 04/03/2018) 1 tablet 3    ROS:   General:  No weight loss, Fever, chills  HEENT: No recent headaches, no nasal bleeding, no visual changes, no sore throat  Neurologic: No dizziness, blackouts, seizures. No recent symptoms of stroke or mini- stroke. No recent episodes of slurred speech, or temporary blindness.  Cardiac: No recent episodes of chest pain/pressure, no shortness of breath at rest.  No shortness of breath with exertion.  Denies history of atrial fibrillation or irregular heartbeat  Vascular: No history of rest pain in feet.  No history of claudication.  No history of non-healing ulcer, No history of DVT   Pulmonary: No home oxygen, no productive cough, no hemoptysis,  No asthma or wheezing  Musculoskeletal:  [ ]  Arthritis, [ ]  Low back pain,  [ ]  Joint pain  Hematologic:No history of hypercoagulable state.  No history of easy bleeding.  No history of anemia  Gastrointestinal: No hematochezia or melena,  No gastroesophageal reflux, no trouble swallowing  Urinary: [X]  chronic Kidney disease, [X]  on HD - [X]  MWF or [ ]  TTHS, [ ]  Burning with urination, [ ]  Frequent urination, [ ]  Difficulty urinating;   Skin: No rashes  Psychological: No history of anxiety,  No history of depression   Physical Examination  Vitals:   04/03/18 2115 04/03/18 2130 04/03/18 2145 04/03/18 2200  BP: 124/67 127/72 131/70 129/69  Pulse: 85 85 81 82  Resp: 15 20 13 15   Temp:      TempSrc:      SpO2: 96% 95% 97% 96%  Weight:      Height:        Body mass index is 29.12 kg/m.  General:  Alert and oriented, no acute distress HEENT: Normal Neck: right side dialysis catheter Pulmonary: Clear to auscultation bilaterally Cardiac: Regular Rate and Rhythm Skin: No rash, mild erythema and yellowing  ecchymosis over right forearm graft no incisional drainage or fluctuance Musculoskeletal: No deformity or edema  Neurologic: Upper and lower extremity motor 5/5 and symmetric  DATA:  CBC    Component Value Date/Time   WBC 9.5 04/03/2018 1958   RBC 3.19 (L) 04/03/2018 1958   HGB 10.8 (L) 04/03/2018 1958   HCT 34.3 (L) 04/03/2018 1958   PLT 192 04/03/2018 1958   MCV 107.5 (H) 04/03/2018 1958   MCH 33.9 04/03/2018 1958   MCHC 31.5 04/03/2018 1958   RDW 13.4 04/03/2018 1958   LYMPHSABS 0.6 (L) 04/03/2018 1958   MONOABS 0.8 04/03/2018 1958   EOSABS 0.1 04/03/2018 1958   BASOSABS 0.0 04/03/2018 1958    BMET    Component Value Date/Time   NA 137 04/03/2018 1958   NA 143 08/07/2015   K 3.8 04/03/2018 1958   CL 94 (L) 04/03/2018 1958   CO2 31 04/03/2018 1958   GLUCOSE 180 (H) 04/03/2018 1958   BUN 19 04/03/2018 1958   CREATININE 3.60 (H) 04/03/2018 1958   CREATININE 2.11 (H) 10/05/2013 1406   CALCIUM 9.0 04/03/2018 1958   GFRNONAA 15 (L) 04/03/2018 1958   GFRAA 18 (L) 04/03/2018 1958     ASSESSMENT:  Fever unknown source.  Graft infection this early post op is less likely.  He could also have infection of his catheter   PLAN:  1. Recommend IV antibiotics with Vanc/Zosyn  2. Blood cultures off catheter  3. Elevate right arm above level of heart  Will follow as consult. Currently doubt graft is source of fever.  Will follow up blood cultures  Catheter removal per discretion of Nephrology   Ruta Hinds, MD [facility] Office: [phone] Pager: [phone]

## 2018-04-03 NOTE — H&P (Signed)
History and Physical    Scott Jones IQJ:[id number] DOB: May 23, 1946 DOA: 04/03/2018  PCP: System, Provider Not In  Patient coming from: Home  I have personally briefly reviewed patient's old medical records in [facility]  Chief Complaint: Fever, R forearm swellign  HPI: Scott Jones is a 72 y.o. male with medical history significant of ESRD, just had dialysis graft placed on Thurs this past week at the [facility].  Since discharge on Friday has developed pain, erythema, swelling, at R fore arm, associated fevers at home.  Was able to undergo dialysis today without difficulty however had worsening pain after dialysis today.  Went to [facility] for complaint and got sent to ED.   ED Course: Put on Vanc.  Tm 101.6.  WBC 9.5k.   Review of Systems: As per HPI otherwise 10 point review of systems negative.   Past Medical History:  Diagnosis Date  . Allergy   . Asthma   . Chronic kidney disease   . Depression   . Diabetes mellitus   . Hepatitis C   . Hypertension     Past Surgical History:  Procedure Laterality Date  . AV FISTULA PLACEMENT    . IR FLUORO GUIDE CV LINE RIGHT  03/18/2018  . IR US GUIDE VASC ACCESS RIGHT  03/18/2018  . NEPHRECTOMY     partial - benign tumor  . PARTIAL NEPHRECTOMY  10/08/2011   Left kidney     reports that he quit smoking about 43 years ago. His smoking use included cigarettes and pipe. He has a 11.00 pack-year smoking history. He has never used smokeless tobacco. He reports that he drinks about 1.0 standard drinks of alcohol per week. He reports that he has current or past drug history. Drug: Marijuana.  No Known Allergies  Family History  Problem Relation Age of Onset  . Diabetes Mother   . Hypertension Mother   . COPD Mother   . Asthma Mother   . Diabetes Father   . Diabetes Sister   . Diabetes Brother   . Kidney disease Brother      Prior to Admission medications   Medication Sig Start Date End Date Taking? Authorizing Provider   allopurinol (ZYLOPRIM) 100 MG tablet TAKE 1 TABLET BY MOUTH ONCE DAILY Patient taking differently: Take 100 mg by mouth daily.  11/17/17  Yes Copland, Gwenlyn Found, MD  aspirin 81 MG tablet Take 81 mg by mouth daily.   Yes [provider]  calcium carbonate (TUMS - DOSED IN MG ELEMENTAL CALCIUM) 500 MG chewable tablet Chew 1 tablet by mouth 3 (three) times daily with meals.    Yes [provider]  docusate sodium (COLACE) 100 MG capsule Take 100 mg by mouth daily as needed for mild constipation.  10/10/11  Yes [provider]  fluticasone (FLONASE) 50 MCG/ACT nasal spray Place 2 sprays into both nostrils daily. Patient taking differently: Place 2 sprays into both nostrils daily as needed for allergies.  01/13/18  Yes Saguier, Ramon Dredge, PA-C  folic acid-vitamin b complex-vitamin c-selenium-zinc (DIALYVITE) 3 MG TABS tablet Take 1 tablet by mouth daily.   Yes [provider]  Homeopathic Products (LEG CRAMP RELIEF PO) Take 1 tablet by mouth daily as needed (leg cramps).    Yes [provider]  lisinopril (PRINIVIL,ZESTRIL) 40 MG tablet TAKE 1 TABLET BY MOUTH ONCE DAILY Patient taking differently: Take 40 mg by mouth daily.  11/07/17  Yes Copland, Gwenlyn Found, MD  NIFEdipine (ADALAT CC)  90 MG 24 hr tablet TAKE 1 TABLET BY MOUTH ONCE DAILY Patient taking differently: Take 90 mg by mouth daily.  08/24/17  Yes Copland, Gay Filler, MD  ondansetron (ZOFRAN ODT) 4 MG disintegrating tablet Take 1 tablet (4 mg total) by mouth every 8 (eight) hours as needed for nausea or vomiting. 03/07/18  Yes Yu, Amy V, PA-C  oxyCODONE (OXY IR/ROXICODONE) 5 MG immediate release tablet Take 1 tablet (5 mg total) by mouth every 6 (six) hours as needed for severe pain. 02/25/18  Yes Shelda Pal, DO  pioglitazone (ACTOS) 30 MG tablet TAKE 1 TABLET BY MOUTH ONCE DAILY Patient taking differently: Take 30 mg by mouth daily.  02/01/18  Yes Copland, Gay Filler, MD  sertraline (ZOLOFT) 100 MG  tablet TAKE 2 TABLETS BY MOUTH ONCE DAILY Patient taking differently: Take 200 mg by mouth daily.  01/30/18  Yes Copland, Gay Filler, MD  simvastatin (ZOCOR) 40 MG tablet TAKE 1 TABLET BY MOUTH ONCE DAILY Patient taking differently: Take 40 mg by mouth every morning.  08/22/17  Yes Copland, Gay Filler, MD    Physical Exam: Vitals:   04/03/18 2230 04/03/18 2245 04/03/18 2306 04/03/18 2315  BP: (!) 144/71 134/67 116/65 (!) 120/56  Pulse:  90 85 85  Resp: $Remo'11 15 14 16  'fZdlZ$ Temp:   100.2 F (37.9 C)   TempSrc:   Oral   SpO2:  96% 97% 96%  Weight:      Height:        Constitutional: NAD, calm, comfortable Eyes: PERRL, lids and conjunctivae normal ENMT: Mucous membranes are moist. Posterior pharynx clear of any exudate or lesions.Normal dentition.  Neck: normal, supple, no masses, no thyromegaly Respiratory: clear to auscultation bilaterally, no wheezing, no crackles. Normal respiratory effort. No accessory muscle use.  Cardiovascular: Regular rate and rhythm, no murmurs / rubs / gallops. No extremity edema. 2+ pedal pulses. No carotid bruits.  Abdomen: no tenderness, no masses palpated. No hepatosplenomegaly. Bowel sounds positive.  Musculoskeletal: no clubbing / cyanosis. No joint deformity upper and lower extremities. Good ROM, no contractures. Normal muscle tone.  Skin: Erythema, induration, edema, TTP of R forearm over graft site. Neurologic: CN 2-12 grossly intact. Sensation intact, DTR normal. Strength 5/5 in all 4.  Psychiatric: Normal judgment and insight. Alert and oriented x 3. Normal mood.    Labs on Admission: I have personally reviewed following labs and imaging studies  CBC: Recent Labs  Lab 04/03/18 1958  WBC 9.5  NEUTROABS 8.1*  HGB 10.8*  HCT 34.3*  MCV 107.5*  PLT 621   Basic Metabolic Panel: Recent Labs  Lab 04/03/18 1958  NA 137  K 3.8  CL 94*  CO2 31  GLUCOSE 180*  BUN 19  CREATININE 3.60*  CALCIUM 9.0   GFR: Estimated Creatinine Clearance: 17.8 mL/min  (A) (by C-G formula based on SCr of 3.6 mg/dL (H)). Liver Function Tests: Recent Labs  Lab 04/03/18 1958  AST 23  ALT 6  ALKPHOS 67  BILITOT 0.8  PROT 7.5  ALBUMIN 3.5   No results for input(s): LIPASE, AMYLASE in the last 168 hours. No results for input(s): AMMONIA in the last 168 hours. Coagulation Profile: Recent Labs  Lab 04/03/18 1958  INR 0.97   Cardiac Enzymes: No results for input(s): CKTOTAL, CKMB, CKMBINDEX, TROPONINI in the last 168 hours. BNP (last 3 results) No results for input(s): PROBNP in the last 8760 hours. HbA1C: No results for input(s): HGBA1C in the last 72 hours. CBG: No  results for input(s): GLUCAP in the last 168 hours. Lipid Profile: No results for input(s): CHOL, HDL, LDLCALC, TRIG, CHOLHDL, LDLDIRECT in the last 72 hours. Thyroid Function Tests: No results for input(s): TSH, T4TOTAL, FREET4, T3FREE, THYROIDAB in the last 72 hours. Anemia Panel: No results for input(s): VITAMINB12, FOLATE, FERRITIN, TIBC, IRON, RETICCTPCT in the last 72 hours. Urine analysis:    Component Value Date/Time   COLORURINE YELLOW 04/03/2018 2232   APPEARANCEUR CLEAR 04/03/2018 2232   LABSPEC 1.012 04/03/2018 2232   PHURINE 8.0 04/03/2018 2232   GLUCOSEU NEGATIVE 04/03/2018 2232   HGBUR NEGATIVE 04/03/2018 2232   BILIRUBINUR NEGATIVE 04/03/2018 2232   KETONESUR NEGATIVE 04/03/2018 2232   PROTEINUR 100 (A) 04/03/2018 2232   UROBILINOGEN 0.2 03/07/2018 1439   NITRITE NEGATIVE 04/03/2018 2232   LEUKOCYTESUR NEGATIVE 04/03/2018 2232    Radiological Exams on Admission: Dg Chest 2 View  Result Date: 04/03/2018 CLINICAL DATA:  Fever.  End-stage renal disease EXAM: CHEST - 2 VIEW COMPARISON:  February 25, 2018 FINDINGS: Central catheter tip is in the right atrium. No pneumothorax. There is atelectatic change in the right base. Lungs elsewhere are clear. Heart size and pulmonary vascularity are normal. No adenopathy. There is degenerative change in thoracic spine.  IMPRESSION: Atelectatic change right base. No edema or consolidation. Heart size normal. Central catheter tip in right atrium. No pneumothorax. Electronically Signed   By: Lowella Grip III M.D.   On: 04/03/2018 20:49    EKG: Independently reviewed.  Assessment/Plan Principal Problem:   Infection of AV graft for dialysis ([place]) Active Problems:   DM (diabetes mellitus) ([place])   HTN (hypertension)   ESRD (end stage renal disease) ([place])    1. Infection of AV graft - 1. NPO after MN 2. Vasc surgery consultation for what appears to be suspicious for infected AV graft 3. Repeat BMP/CBC in AM 4. BCx pending 5. Empiric vanc and rocephin 2. HTN - 1. Holding lisinopril 2. Continue nifedipine PO 3. DM - 1. Hold Actos 2. Sensitive scale SSI q4h 4. ESRD - 1. Call nephrology in AM, has tunneled cath as access site.  DVT prophylaxis: SCDs Code Status: Full Family Communication: No family in room Disposition Plan: Home after admit Consults called: Vasc surgery Admission status: Admit to inpatient  Severity of Illness: The appropriate patient status for this patient is INPATIENT. Inpatient status is judged to be reasonable and necessary in order to provide the required intensity of service to ensure the patient's safety. The patient's presenting symptoms, physical exam findings, and initial radiographic and laboratory data in the context of their chronic comorbidities is felt to place them at high risk for further clinical deterioration. Furthermore, it is not anticipated that the patient will be medically stable for discharge from the hospital within 2 midnights of admission. The following factors support the patient status of inpatient.   " The patient's presenting symptoms include R forearm graft placed Thurs, now with fever, erythema, edema, tenderness to graft site. " The worrisome physical exam findings include erythema, edema, tenderness, fever.  " The chronic co-morbidities  include ESRD dialysis MWF.   * I certify that at the point of admission it is my clinical judgment that the patient will require inpatient hospital care spanning beyond 2 midnights from the point of admission due to high intensity of service, high risk for further deterioration and high frequency of surveillance required.Etta Quill DO [facility] Pager [phone] Only works nights!  If 7AM-7PM, please  contact the primary day team physician taking care of patient  [email or web address] Password [facility]  04/03/2018, 11:31 PM

## 2018-04-03 NOTE — ED Notes (Signed)
ED Provider at bedside. 

## 2018-04-03 NOTE — ED Provider Notes (Signed)
[facility] EMERGENCY DEPARTMENT Provider Note   CSN: [id number] Arrival date & time: 04/03/18  6503     History   Chief Complaint Chief Complaint  Patient presents with  . Right Forearm Swelling    Fever    HPI Scott Jones is a 72 y.o. male.  HPI Patient is a 72 year old male with a past medical history of end-stage renal disease on Monday, Wednesday, Friday dialysis, depression, asthma, diabetes, hepatitis C, and hypertension who presents to the emergency department for evaluation of right forearm swelling and fevers.  Patient reports that last Thursday he was seen at the [facility] in [place] for surgery to implant a new dialysis graft in his right forearm.  States that he was kept overnight following the procedure and did well.  States that since discharge he has had increasing pain in his right forearm.  He was able to undergo dialysis today without difficulty, however after leaving dialysis he continued to have worsening of his right forearm pain as well as fever.  Patient was seen at urgent care for his complaint, however was sent to the emergency department for further evaluation.  Outside of right forearm pain and fever patient denies any other symptoms at this time.  Describes his pain as dull and achy in nature, worse with palpation, does not radiate, and currently a 7 out of 10.  Tried Tylenol earlier this afternoon without improvement to his pain.  Remaining review of systems as below.  Past Medical History:  Diagnosis Date  . Allergy   . Asthma   . Chronic kidney disease   . Depression   . Diabetes mellitus   . Hepatitis C   . Hypertension     Patient Active Problem List   Diagnosis Date Noted  . Acute gouty arthritis 10/13/2016  . H/O partial nephrectomy 02/16/2016  . Depression (emotion) 12/28/2015  . Allergic rhinitis 12/28/2015  . History of hepatitis C 12/28/2015  . Dyslipidemia 12/28/2015  . Chronic kidney disease (CKD), stage IV  (severe) ([place]) 12/28/2015  . DM (diabetes mellitus) ([place]) 02/02/2012  . HTN (hypertension) 02/02/2012    Past Surgical History:  Procedure Laterality Date  . AV FISTULA PLACEMENT    . IR FLUORO GUIDE CV LINE RIGHT  03/18/2018  . IR US GUIDE VASC ACCESS RIGHT  03/18/2018  . NEPHRECTOMY     partial - benign tumor  . PARTIAL NEPHRECTOMY  10/08/2011   Left kidney        Home Medications    Prior to Admission medications   Medication Sig Start Date End Date Taking? Authorizing Provider  allopurinol (ZYLOPRIM) 100 MG tablet TAKE 1 TABLET BY MOUTH ONCE DAILY Patient taking differently: Take 100 mg by mouth daily.  11/17/17  Yes Copland, Gay Filler, MD  aspirin 81 MG tablet Take 81 mg by mouth daily.   Yes [provider]  calcium carbonate (TUMS - DOSED IN MG ELEMENTAL CALCIUM) 500 MG chewable tablet Chew 1 tablet by mouth 3 (three) times daily with meals.    Yes [provider]  docusate sodium (COLACE) 100 MG capsule Take 100 mg by mouth daily as needed for mild constipation.  10/10/11  Yes [provider]  fluticasone (FLONASE) 50 MCG/ACT nasal spray Place 2 sprays into both nostrils daily. Patient taking differently: Place 2 sprays into both nostrils daily as needed for allergies.  01/13/18  Yes Saguier, Percell Miller, PA-C  folic acid-vitamin b complex-vitamin c-selenium-zinc (DIALYVITE) 3 MG TABS tablet Take  1 tablet by mouth daily.   Yes [provider]  Homeopathic Products (LEG CRAMP RELIEF PO) Take 1 tablet by mouth daily as needed (leg cramps).    Yes [provider]  lisinopril (PRINIVIL,ZESTRIL) 40 MG tablet TAKE 1 TABLET BY MOUTH ONCE DAILY Patient taking differently: Take 40 mg by mouth daily.  11/07/17  Yes Copland, Gay Filler, MD  NIFEdipine (ADALAT CC) 90 MG 24 hr tablet TAKE 1 TABLET BY MOUTH ONCE DAILY Patient taking differently: Take 90 mg by mouth daily.  08/24/17  Yes Copland, Gay Filler, MD  ondansetron (ZOFRAN ODT) 4 MG disintegrating  tablet Take 1 tablet (4 mg total) by mouth every 8 (eight) hours as needed for nausea or vomiting. 03/07/18  Yes Yu, Amy V, PA-C  oxyCODONE (OXY IR/ROXICODONE) 5 MG immediate release tablet Take 1 tablet (5 mg total) by mouth every 6 (six) hours as needed for severe pain. 02/25/18  Yes Shelda Pal, DO  pioglitazone (ACTOS) 30 MG tablet TAKE 1 TABLET BY MOUTH ONCE DAILY Patient taking differently: Take 30 mg by mouth daily.  02/01/18  Yes Copland, Gay Filler, MD  sertraline (ZOLOFT) 100 MG tablet TAKE 2 TABLETS BY MOUTH ONCE DAILY Patient taking differently: Take 200 mg by mouth daily.  01/30/18  Yes Copland, Gay Filler, MD  simvastatin (ZOCOR) 40 MG tablet TAKE 1 TABLET BY MOUTH ONCE DAILY Patient taking differently: Take 40 mg by mouth every morning.  08/22/17  Yes Copland, Gay Filler, MD  azithromycin (ZITHROMAX) 250 MG tablet Take 2 tablets by mouth on day 1, followed by 1 tablet by mouth daily for 4 days. Patient not taking: Reported on 04/03/2018 01/13/18   Saguier, Percell Miller, PA-C  benzonatate (TESSALON) 100 MG capsule Take 1 capsule (100 mg total) by mouth 3 (three) times daily as needed for cough. Patient not taking: Reported on 04/03/2018 01/13/18   Saguier, Percell Miller, PA-C  fluticasone [facility]) 50 MCG/ACT nasal spray Place 2 sprays into both nostrils daily. Patient not taking: Reported on 04/03/2018 06/27/17   Saguier, Percell Miller, PA-C  furosemide (LASIX) 40 MG tablet Take 2 tablets in the morning and 1 in the afternoon Patient not taking: Reported on 04/03/2018 07/26/16   Copland, Gay Filler, MD    Family History Family History  Problem Relation Age of Onset  . Diabetes Mother   . Hypertension Mother   . COPD Mother   . Asthma Mother   . Diabetes Father   . Diabetes Sister   . Diabetes Brother   . Kidney disease Brother     Social History Social History   Tobacco Use  . Smoking status: Former Smoker    Packs/day: 1.00    Years: 11.00    Pack years: 11.00    Types: Cigarettes,  Pipe    Last attempt to quit: 08/07/1974    Years since quitting: 43.6  . Smokeless tobacco: Never Used  Substance Use Topics  . Alcohol use: Yes    Alcohol/week: 1.0 standard drinks    Types: 1 Cans of beer per week    Comment: 1 glass wine every few weeks.  . Drug use: Yes    Types: Marijuana    Comment: Marijuana cookie/candy 1x monthly     Allergies   Patient has no known allergies.   Review of Systems Review of Systems  Constitutional: Positive for fever. Negative for chills.  HENT: Negative for ear pain and sore throat.   Eyes: Negative for pain and visual disturbance.  Respiratory: Negative  for cough and shortness of breath.   Cardiovascular: Negative for chest pain and palpitations.  Gastrointestinal: Negative for abdominal pain and vomiting.  Genitourinary: Negative for dysuria and hematuria.  Musculoskeletal: Negative for arthralgias and back pain.       Redness, swelling, and pain present in his right forearm.   Skin: Negative for color change and rash.  Neurological: Negative for seizures and syncope.  Psychiatric/Behavioral: Negative for agitation and behavioral problems.  All other systems reviewed and are negative.    Physical Exam Updated Vital Signs BP 116/65 (BP Location: Left Arm)   Pulse 85   Temp 100.2 F (37.9 C) (Oral)   Resp 14   Ht $R'5\' 5"'xq$  (1.651 m)   Wt 79.4 kg   SpO2 97%   BMI 29.12 kg/m   Physical Exam  Constitutional: He appears well-developed and well-nourished.  HENT:  Head: Normocephalic and atraumatic.  Eyes: Conjunctivae are normal.  Neck: Neck supple.  Cardiovascular: Normal rate and regular rhythm.  Pulmonary/Chest: Effort normal and breath sounds normal. No respiratory distress.  Dialysis catheter present in patient's right upper chest.  It appears clean dry and intact without evidence of erythema or swelling.  Abdominal: Soft. There is no tenderness.  Musculoskeletal: He exhibits no edema.  Patient with erythema and  induration present over his recent graft site on his right forearm.  He has a palpable pulse and thrill in the graft.  There is no active purulent drainage.  Sensation and pulses present distal to the graft site.  Neurological: He is alert.  Skin: Skin is warm and dry.  Psychiatric: He has a normal mood and affect.  Nursing note and vitals reviewed.    ED Treatments / Results  Labs (all labs ordered are listed, but only abnormal results are displayed) Labs Reviewed  COMPREHENSIVE METABOLIC PANEL - Abnormal; Notable for the following components:      Result Value   Chloride 94 (*)    Glucose, Bld 180 (*)    Creatinine, Ser 3.60 (*)    GFR calc non Af Amer 15 (*)    GFR calc Af Amer 18 (*)    All other components within normal limits  CBC WITH DIFFERENTIAL/PLATELET - Abnormal; Notable for the following components:   RBC 3.19 (*)    Hemoglobin 10.8 (*)    HCT 34.3 (*)    MCV 107.5 (*)    Neutro Abs 8.1 (*)    Lymphs Abs 0.6 (*)    All other components within normal limits  URINALYSIS, ROUTINE W REFLEX MICROSCOPIC - Abnormal; Notable for the following components:   Protein, ur 100 (*)    All other components within normal limits  CULTURE, BLOOD (ROUTINE X 2)  CULTURE, BLOOD (ROUTINE X 2)  URINE CULTURE  PROTIME-INR  I-STAT CG4 LACTIC ACID, ED  I-STAT CG4 LACTIC ACID, ED    EKG None  Radiology Dg Chest 2 View  Result Date: 04/03/2018 CLINICAL DATA:  Fever.  End-stage renal disease EXAM: CHEST - 2 VIEW COMPARISON:  February 25, 2018 FINDINGS: Central catheter tip is in the right atrium. No pneumothorax. There is atelectatic change in the right base. Lungs elsewhere are clear. Heart size and pulmonary vascularity are normal. No adenopathy. There is degenerative change in thoracic spine. IMPRESSION: Atelectatic change right base. No edema or consolidation. Heart size normal. Central catheter tip in right atrium. No pneumothorax. Electronically Signed   By: Lowella Grip III  M.D.   On: 04/03/2018 20:49  Procedures Procedures (including critical care time)  Medications Ordered in ED Medications  vancomycin (VANCOCIN) IVPB 1000 mg/200 mL premix (1,000 mg Intravenous New Bag/Given 04/03/18 2231)  fentaNYL (SUBLIMAZE) injection 50 mcg (50 mcg Intravenous Given 04/03/18 2228)     Initial Impression / Assessment and Plan / ED Course  I have reviewed the triage vital signs and the nursing notes.  Pertinent labs & imaging results that were available during my care of the patient were reviewed by me and considered in my medical decision making (see chart for details).    Patient is a 72 year old male with past medical history as detailed above who presents to the emergency department for evaluation of right arm erythema, pain, and swelling as well as new onset fever.  Patient was seen at urgent care earlier this afternoon secondary to his pain in his forearm, however due to concern for possible graft site infection he was sent to the emergency department for further evaluation.  Upon arrival patient is febrile but otherwise hemodynamically stable.  His right arm has findings as detailed above in the physical exam.  Given these physical exam findings and patient's fever vascular surgery was consulted to evaluate the patient while in the emergency department.  He was evaluated in the emergency department by Dr. Oneida Alar who recommends admission to the hospitalist service for IV antibiotics.  Patient will be followed by vascular surgery during his admission.  Patient was started on IV vancomycin on the emergency department.  Patient with no further acute events while under my care.  The care of this patient was discussed with my attending physician Dr. Darl Householder, who voices agreement with work-up and ED disposition.  Final Clinical Impressions(s) / ED Diagnoses   Final diagnoses:  Fever in adult  Cellulitis of right upper extremity    ED Discharge Orders    None         Meztli Llanas, Chanda Busing, MD 04/04/18 1459    Drenda Freeze, MD 04/05/18 [phone]

## 2018-04-03 NOTE — Progress Notes (Signed)
Pharmacy Antibiotic Note  Scott Jones is a 72 y.o. male admitted on 04/03/2018 with AVG infection.  Pharmacy has been consulted for Vancomycin dosing.  Vancomycin 1 g IV given in ED at  2230  Plan: Vancomycin 500 mg IV now for total of 1500 mg tonight Vancomycin 750 mg IV after each HD  Height: $Remove'5\' 5"'MjGyBYY$  (165.1 cm) Weight: 175 lb (79.4 kg) IBW/kg (Calculated) : 61.5  Temp (24hrs), Avg:100.8 F (38.2 C), Min:100.1 F (37.8 C), Max:101.6 F (38.7 C)  Recent Labs  Lab 04/03/18 1958 04/03/18 2014 04/03/18 2144  WBC 9.5  --   --   CREATININE 3.60*  --   --   LATICACIDVEN  --  1.35 1.08    Estimated Creatinine Clearance: 17.8 mL/min (A) (by C-G formula based on SCr of 3.6 mg/dL (H)).    No Known Allergies   Scott Jones 04/03/2018 11:24 PM

## 2018-04-03 NOTE — ED Notes (Addendum)
Pt recently had surgery at the [facility] in [place] to implant a new dialysis graft in his R arm on Thurs last week, 03/30/18. Reporting swelling to R arm beginning today, with incr'd pain to same. Had dialysis today with no problems. Went to UC for arm swelling, pain and noted to have fever as well. Took $RemoveBe'1000mg'QdyNiSinG$  tylenol around 1620 today.

## 2018-04-04 ENCOUNTER — Other Ambulatory Visit: Payer: Self-pay

## 2018-04-04 ENCOUNTER — Encounter (HOSPITAL_COMMUNITY): Payer: Self-pay | Admitting: General Practice

## 2018-04-04 LAB — CBC
HCT: 33 % — ABNORMAL LOW (ref 39.0–52.0)
Hemoglobin: 10 g/dL — ABNORMAL LOW (ref 13.0–17.0)
MCH: 33 pg (ref 26.0–34.0)
MCHC: 30.3 g/dL (ref 30.0–36.0)
MCV: 108.9 fL — ABNORMAL HIGH (ref 80.0–100.0)
Platelets: 182 K/uL (ref 150–400)
RBC: 3.03 MIL/uL — ABNORMAL LOW (ref 4.22–5.81)
RDW: 13.6 % (ref 11.5–15.5)
WBC: 8.5 K/uL (ref 4.0–10.5)
nRBC: 0 % (ref 0.0–0.2)

## 2018-04-04 LAB — GLUCOSE, CAPILLARY
Glucose-Capillary: 109 mg/dL — ABNORMAL HIGH (ref 70–99)
Glucose-Capillary: 96 mg/dL (ref 70–99)
Glucose-Capillary: 155 mg/dL — ABNORMAL HIGH (ref 70–99)
Glucose-Capillary: 111 mg/dL — ABNORMAL HIGH (ref 70–99)

## 2018-04-04 LAB — BASIC METABOLIC PANEL
Anion gap: 7 (ref 5–15)
BUN: 31 mg/dL — AB (ref 8–23)
CALCIUM: 8.5 mg/dL — AB (ref 8.9–10.3)
CO2: 33 mmol/L — ABNORMAL HIGH (ref 22–32)
Chloride: 98 mmol/L (ref 98–111)
Creatinine, Ser: 4.25 mg/dL — ABNORMAL HIGH (ref 0.61–1.24)
GFR, EST AFRICAN AMERICAN: 15 mL/min — AB (ref >60)
GFR, EST NON AFRICAN AMERICAN: 13 mL/min — AB (ref >60)
GLUCOSE: 113 mg/dL — AB (ref 70–99)
Potassium: 3.9 mmol/L (ref 3.5–5.1)
SODIUM: 138 mmol/L (ref 135–145)

## 2018-04-04 LAB — MRSA PCR SCREENING: MRSA BY PCR: NEGATIVE

## 2018-04-04 LAB — CBG MONITORING, ED: Glucose-Capillary: 104 mg/dL — ABNORMAL HIGH (ref 70–99)

## 2018-04-04 MED ORDER — SODIUM CHLORIDE 0.9 % IV SOLN
INTRAVENOUS | Status: DC | PRN
  Administered 2018-04-04: 250 mL via INTRAVENOUS

## 2018-04-04 MED ORDER — NEPRO/CARBSTEADY PO LIQD
237.0000 mL | Freq: Two times a day (BID) | ORAL | Status: DC
  Administered 2018-04-05: 237 mL via ORAL
  Filled 2018-04-04 (×5): qty 237

## 2018-04-04 NOTE — Progress Notes (Signed)
PROGRESS NOTE    Scott Jones  GHW:[id number] DOB: [birth year] DOA: 04/03/2018 PCP: System, Provider Not In    Brief Narrative:  Scott Jones is a 72 y.o. male with medical history significant of ESRD, just had dialysis graft placed on Thurs this past week at the [facility].  Since discharge on Friday has developed pain, erythema, swelling, at R fore arm, associated fevers at home.  Was able to undergo dialysis yesterday without difficulty however had worsening pain after dialysis. He was started on broad spectrum IV antibiotics and vascular surgery consulted.   Assessment & Plan:   Principal Problem:   Infection of AV graft for dialysis ([place]) Active Problems:   DM (diabetes mellitus) ([place])   HTN (hypertension)   ESRD (end stage renal disease) ([place])    Infection of the AV graft: - started him on broad spectrum IV antibiotics and vascular surgery consulted.  Blood cultures pending.  Pt reports the pain is persistent and relieved with pain meds.  Elevate the arm .     Hypertension;  Well controlled.   Hyperlipidemia: Resume zocor.    Diabetes Mellitus: CBG (last 3)  Recent Labs    04/04/18 0805 04/04/18 1206 04/04/18 1609  GLUCAP 109* 96 155*   Resume SSI.    ESRD: ON hd, MWF.   Anemia of chronic disease: Hemoglobin is stable around 10.   DVT prophylaxis: HEPARIN.  Code Status: Full code Family Communication: Family at bedside Disposition Plan: Pending clinical improvement and vascular surgery recommendations   Consultants:   Vascular surgery  Nephrology    Procedures: None  Antimicrobials: Cefepime and vancomycin   Subjective: Pain is persistent in the right arm when moving.  Keeping it elevated above the pillows.   Objective: Vitals:   04/04/18 0000 04/04/18 0015 04/04/18 0501 04/04/18 0805  BP: 132/82 129/72 128/69 133/75  Pulse: 98 60 80 70  Resp: $Remo'17 10 18 18  'zCLel$ Temp:   98.7 F (37.1 C) 98.4 F (36.9 C)  TempSrc:   Oral Oral    SpO2: 100% 92% 96% 99%  Weight:      Height:        Intake/Output Summary (Last 24 hours) at 04/04/2018 1409 Last data filed at 04/04/2018 1242 Gross per 24 hour  Intake 640 ml  Output -  Net 640 ml   Filed Weights   04/03/18 1947  Weight: 79.4 kg    Examination:  General exam: Appears calm and comfortable  Respiratory system: Clear to auscultation. Respiratory effort normal.  No wheezing or rhonchi Cardiovascular system: S1 & S2 heard, RRR. No JVD, murmurs,  Gastrointestinal system: Abdomen is nondistended, soft and nontender. Normal bowel sounds heard. Central nervous system: Alert and oriented. No focal neurological deficits. Extremities: right UE swelling and tenderness at the site of the AV graft.  Skin: No rashes, lesions or ulcers Psychiatry:  Mood & affect appropriate.     Data Reviewed: I have personally reviewed following labs and imaging studies  CBC: Recent Labs  Lab 04/03/18 1958 04/04/18 0510  WBC 9.5 8.5  NEUTROABS 8.1*  --   HGB 10.8* 10.0*  HCT 34.3* 33.0*  MCV 107.5* 108.9*  PLT 192 789   Basic Metabolic Panel: Recent Labs  Lab 04/03/18 1958 04/04/18 0510  NA 137 138  K 3.8 3.9  CL 94* 98  CO2 31 33*  GLUCOSE 180* 113*  BUN 19 31*  CREATININE 3.60* 4.25*  CALCIUM 9.0 8.5*   GFR: Estimated Creatinine Clearance:  15 mL/min (A) (by C-G formula based on SCr of 4.25 mg/dL (H)). Liver Function Tests: Recent Labs  Lab 04/03/18 1958  AST 23  ALT 6  ALKPHOS 67  BILITOT 0.8  PROT 7.5  ALBUMIN 3.5   No results for input(s): LIPASE, AMYLASE in the last 168 hours. No results for input(s): AMMONIA in the last 168 hours. Coagulation Profile: Recent Labs  Lab 04/03/18 1958  INR 0.97   Cardiac Enzymes: No results for input(s): CKTOTAL, CKMB, CKMBINDEX, TROPONINI in the last 168 hours. BNP (last 3 results) No results for input(s): PROBNP in the last 8760 hours. HbA1C: No results for input(s): HGBA1C in the last 72  hours. CBG: Recent Labs  Lab 04/03/18 2349 04/04/18 0505 04/04/18 0805 04/04/18 1206  GLUCAP 144* 104* 109* 96   Lipid Profile: No results for input(s): CHOL, HDL, LDLCALC, TRIG, CHOLHDL, LDLDIRECT in the last 72 hours. Thyroid Function Tests: No results for input(s): TSH, T4TOTAL, FREET4, T3FREE, THYROIDAB in the last 72 hours. Anemia Panel: No results for input(s): VITAMINB12, FOLATE, FERRITIN, TIBC, IRON, RETICCTPCT in the last 72 hours. Sepsis Labs: Recent Labs  Lab 04/03/18 2014 04/03/18 2144  LATICACIDVEN 1.35 1.08    Recent Results (from the past 240 hour(s))  Culture, blood (Routine x 2)     Status: None (Preliminary result)   Collection Time: 04/03/18  7:59 PM  Result Value Ref Range Status   Specimen Description BLOOD LEFT ANTECUBITAL  Final   Special Requests   Final    BOTTLES DRAWN AEROBIC AND ANAEROBIC Blood Culture adequate volume   Culture   Final    NO GROWTH < 24 HOURS Performed at [facility], [address] [place], [place] [address]    Report Status PENDING  Incomplete  Culture, blood (Routine x 2)     Status: None (Preliminary result)   Collection Time: 04/03/18  9:29 PM  Result Value Ref Range Status   Specimen Description BLOOD LEFT FOREARM  Final   Special Requests   Final    BOTTLES DRAWN AEROBIC AND ANAEROBIC Blood Culture adequate volume   Culture   Final    NO GROWTH < 24 HOURS Performed at [facility], [place] [address], [place], [place] [address]    Report Status PENDING  Incomplete         Radiology Studies: Dg Chest 2 View  Result Date: 04/03/2018 CLINICAL DATA:  Fever.  End-stage renal disease EXAM: CHEST - 2 VIEW COMPARISON:  February 25, 2018 FINDINGS: Central catheter tip is in the right atrium. No pneumothorax. There is atelectatic change in the right base. Lungs elsewhere are clear. Heart size and pulmonary vascularity are normal. No adenopathy. There is degenerative change in thoracic spine. IMPRESSION:  Atelectatic change right base. No edema or consolidation. Heart size normal. Central catheter tip in right atrium. No pneumothorax. Electronically Signed   By: Lowella Grip III M.D.   On: 04/03/2018 20:49        Scheduled Meds: . allopurinol  100 mg Oral Daily  . insulin aspart  0-9 Units Subcutaneous Q4H  . NIFEdipine  90 mg Oral Daily  . sertraline  200 mg Oral Daily  . simvastatin  40 mg Oral Daily   Continuous Infusions: . cefTRIAXone (ROCEPHIN)  IV Stopped (04/04/18 0000)  . [START ON 04/05/2018] vancomycin       LOS: 1 day    Time spent: 35 MINUTES.     Hosie Poisson, MD [facility] Pager [phone]  If 7PM-7AM, please contact night-coverage [email or web address] Password [email or web address] 04/04/2018, 2:09 PM

## 2018-04-04 NOTE — Progress Notes (Signed)
New Admission Note:   Arrival Method: WC Mental Orientation: A&O X4  Telemetry: Not Ordered Assessment: Completed Skin: See Flowsheets IV: WDL Pain:8/10 Tubes: Safety Measures: Safety Fall Prevention Plan has been given, discussed and signed Admission: Completed Unit Orientation: Patient has been orientated to the room, unit and staff.  Family: Wife at bedside  Orders have been reviewed and implemented. Will continue to monitor the patient. Call light has been placed within reach and bed alarm has been activated.    Aneta Mins BSN, RN

## 2018-04-04 NOTE — Progress Notes (Addendum)
  Progress Note    04/04/2018 10:07 AM  Subjective:  R arm feels a little better per patient.  Denies steal symptoms R hand.  Still sore to touch tunneled track of graft.   Vitals:   04/04/18 0501 04/04/18 0805  BP: 128/69 133/75  Pulse: 80 70  Resp: 18 18  Temp: 98.7 F (37.1 C) 98.4 F (36.9 C)  SpO2: 96% 99%   Physical Exam: Lungs:  Non labored Incisions:  Incisions of R arm without drainage Extremities:  Symmetrical grip strength, palpable thrill and audible bruit throughout graft; local edema to R forearm Neurologic: A&O  CBC    Component Value Date/Time   WBC 8.5 04/04/2018 0510   RBC 3.03 (L) 04/04/2018 0510   HGB 10.0 (L) 04/04/2018 0510   HCT 33.0 (L) 04/04/2018 0510   PLT 182 04/04/2018 0510   MCV 108.9 (H) 04/04/2018 0510   MCH 33.0 04/04/2018 0510   MCHC 30.3 04/04/2018 0510   RDW 13.6 04/04/2018 0510   LYMPHSABS 0.6 (L) 04/03/2018 1958   MONOABS 0.8 04/03/2018 1958   EOSABS 0.1 04/03/2018 1958   BASOSABS 0.0 04/03/2018 1958    BMET    Component Value Date/Time   NA 138 04/04/2018 0510   NA 143 08/07/2015   K 3.9 04/04/2018 0510   CL 98 04/04/2018 0510   CO2 33 (H) 04/04/2018 0510   GLUCOSE 113 (H) 04/04/2018 0510   BUN 31 (H) 04/04/2018 0510   CREATININE 4.25 (H) 04/04/2018 0510   CREATININE 2.11 (H) 10/05/2013 1406   CALCIUM 8.5 (L) 04/04/2018 0510   GFRNONAA 13 (L) 04/04/2018 0510   GFRAA 15 (L) 04/04/2018 0510    INR    Component Value Date/Time   INR 0.97 04/03/2018 1958     Intake/Output Summary (Last 24 hours) at 04/04/2018 1007 Last data filed at 04/04/2018 0820 Gross per 24 hour  Intake 400 ml  Output -  Net 400 ml     Assessment/Plan:  72 y.o. male is s/p R forearm loop AV graft POD5  Patient now afebrile with WBC wnl; cultures pending; continue IV abx Encouraged elevation RUE Dr. Oneida Alar to re-evaluate later today; no obvious indication for AV graft or TDC removal currently   Dagoberto Ligas, PA-C [facility] [phone] 04/04/2018 10:07 AM  Agree with above.  Most likely this is usual post op pain and swelling.  If continues to improve could probably d/c home tomorrow.  Ok to eat  Ruta Hinds, MD [facility] [place] Office: [phone] Pager: [phone]

## 2018-04-05 DIAGNOSIS — E1122 Type 2 diabetes mellitus with diabetic chronic kidney disease: Secondary | ICD-10-CM

## 2018-04-05 DIAGNOSIS — I10 Essential (primary) hypertension: Secondary | ICD-10-CM

## 2018-04-05 DIAGNOSIS — N186 End stage renal disease: Secondary | ICD-10-CM

## 2018-04-05 DIAGNOSIS — E440 Moderate protein-calorie malnutrition: Secondary | ICD-10-CM | POA: Insufficient documentation

## 2018-04-05 DIAGNOSIS — Z992 Dependence on renal dialysis: Secondary | ICD-10-CM

## 2018-04-05 DIAGNOSIS — T827XXA Infection and inflammatory reaction due to other cardiac and vascular devices, implants and grafts, initial encounter: Principal | ICD-10-CM

## 2018-04-05 DIAGNOSIS — Z794 Long term (current) use of insulin: Secondary | ICD-10-CM

## 2018-04-05 LAB — URINE CULTURE: CULTURE: NO GROWTH

## 2018-04-05 LAB — GLUCOSE, CAPILLARY
GLUCOSE-CAPILLARY: 132 mg/dL — AB (ref 70–99)
GLUCOSE-CAPILLARY: 190 mg/dL — AB (ref 70–99)
GLUCOSE-CAPILLARY: 124 mg/dL — AB (ref 70–99)
Glucose-Capillary: 96 mg/dL (ref 70–99)
Glucose-Capillary: 90 mg/dL (ref 70–99)

## 2018-04-05 MED ORDER — HEPARIN SODIUM (PORCINE) 1000 UNIT/ML DIALYSIS
3000.0000 [IU] | Freq: Once | INTRAMUSCULAR | Status: AC
  Administered 2018-04-05: 3000 [IU] via INTRAVENOUS_CENTRAL

## 2018-04-05 MED ORDER — CHLORHEXIDINE GLUCONATE CLOTH 2 % EX PADS: 6.0000 | MEDICATED_PAD | Freq: Every day | CUTANEOUS | Status: DC

## 2018-04-05 MED ORDER — CEFDINIR 300 MG PO CAPS
300.0000 mg | ORAL_CAPSULE | ORAL | Status: DC
  Administered 2018-04-05: 300 mg via ORAL
  Filled 2018-04-05: qty 1

## 2018-04-05 MED ORDER — HEPARIN SODIUM (PORCINE) 1000 UNIT/ML IJ SOLN
INTRAMUSCULAR | Status: AC
  Administered 2018-04-05: 3000 [IU] via INTRAVENOUS_CENTRAL
  Filled 2018-04-05: qty 3

## 2018-04-05 MED ORDER — RENA-VITE PO TABS
1.0000 | ORAL_TABLET | Freq: Every day | ORAL | Status: DC
  Administered 2018-04-06: 1 via ORAL
  Filled 2018-04-05: qty 1

## 2018-04-05 MED ORDER — ACETAMINOPHEN 325 MG PO TABS
ORAL_TABLET | ORAL | Status: AC
  Administered 2018-04-05: 650 mg via ORAL
  Filled 2018-04-05: qty 2

## 2018-04-05 MED ORDER — DOXERCALCIFEROL 4 MCG/2ML IV SOLN
3.0000 ug | INTRAVENOUS | Status: DC
  Administered 2018-04-06: 3 ug via INTRAVENOUS

## 2018-04-05 NOTE — Progress Notes (Signed)
[facility] [place]  Subjective  - arm still hurts   Objective (!) 143/72 66 98.2 F (36.8 C) (Oral) 16 98%  Intake/Output Summary (Last 24 hours) at 04/05/2018 0751 Last data filed at 04/05/2018 0600 Gross per 24 hour  Intake 582.79 ml  Output 475 ml  Net 107.79 ml   Right forearm AV graft mild erythema no fluctuance + thrill  Assessment/Planning: No further fever arm improving Would probably do one week of antibiotics but most likely this is just post op pain swelling expected for AV graft Ok for d/c tomorrow from my standpoint  Ruta Hinds 04/05/2018 7:51 AM --  Laboratory Lab Results: Recent Labs    04/03/18 1958 04/04/18 0510  WBC 9.5 8.5  HGB 10.8* 10.0*  HCT 34.3* 33.0*  PLT 192 182   BMET Recent Labs    04/03/18 1958 04/04/18 0510  NA 137 138  K 3.8 3.9  CL 94* 98  CO2 31 33*  GLUCOSE 180* 113*  BUN 19 31*  CREATININE 3.60* 4.25*  CALCIUM 9.0 8.5*    COAG Lab Results  Component Value Date   INR 0.97 04/03/2018   No results found for: PTT

## 2018-04-05 NOTE — Progress Notes (Signed)
Patient refused bed alarm to be turned on. Advised patient to call for assistance before getting up.Patient verbalized understanding. Call bell within reach. Jesus Nevills, Wonda Cheng, Therapist, sports

## 2018-04-05 NOTE — Consult Note (Signed)
Renal Service Consult Note [facility]  Scott Jones 04/05/2018 [name] Requesting Physician:  Dr Lonny Prude, R  Reason for Consult:  ESRD pt w/ red and painful AV graft HPI: The patient is a 72 y.o. year-old with hx of DM2, hep C and HTN who recently started HD in late October w/ a L arm AVF.  The AVF failed after 1 use and then he had TDC placed here by IR 10/26 and then last week had L forearm AVG placed in [place] at the [place] on 03/30/2018.  He presented here w/ red painful R arm AVG on 11/11 and was admitted and started on IV abx.  Seen by VVS who felt this would be early for AVG infection and it may be reaction to placement/ allergy.  Blood cx's were negative and temp's down from 101 on admit to normal now and pain is better. Last HD was 11/11.  Asked to see for ESRD.    Pt dneies any SOB, CP, abd pain, n/v/d.    ROS  denies CP  no joint pain   no HA  no blurry vision  no rash  no diarrhea  no nausea/ vomiting   Past Medical History  Past Medical History:  Diagnosis Date  . Allergy   . Asthma   . Chronic kidney disease   . Depression   . Diabetes mellitus   . Hepatitis C   . Hypertension   . Infection 03/2018   RIGHT ARM DIALYSIS CATHETER   Past Surgical History  Past Surgical History:  Procedure Laterality Date  . AV FISTULA PLACEMENT    . IR FLUORO GUIDE CV LINE RIGHT  03/18/2018  . IR US GUIDE VASC ACCESS RIGHT  03/18/2018  . NEPHRECTOMY     partial - benign tumor  . PARTIAL NEPHRECTOMY  10/08/2011   Left kidney   Family History  Family History  Problem Relation Age of Onset  . Diabetes Mother   . Hypertension Mother   . COPD Mother   . Asthma Mother   . Diabetes Father   . Diabetes Sister   . Diabetes Brother   . Kidney disease Brother    Social History  reports that he quit smoking about 43 years ago. His smoking use included cigarettes and pipe. He has a 11.00 pack-year smoking history. He has never used smokeless  tobacco. He reports that he drinks about 1.0 standard drinks of alcohol per week. He reports that he has current or past drug history. Drug: Marijuana. Allergies No Known Allergies Home medications Prior to Admission medications   Medication Sig Start Date End Date Taking? Authorizing Provider  allopurinol (ZYLOPRIM) 100 MG tablet TAKE 1 TABLET BY MOUTH ONCE DAILY Patient taking differently: Take 100 mg by mouth daily.  11/17/17  Yes Copland, Gay Filler, MD  aspirin 81 MG tablet Take 81 mg by mouth daily.   Yes [provider]  calcium carbonate (TUMS - DOSED IN MG ELEMENTAL CALCIUM) 500 MG chewable tablet Chew 1 tablet by mouth 3 (three) times daily with meals.    Yes [provider]  docusate sodium (COLACE) 100 MG capsule Take 100 mg by mouth daily as needed for mild constipation.  10/10/11  Yes [provider]  fluticasone (FLONASE) 50 MCG/ACT nasal spray Place 2 sprays into both nostrils daily. Patient taking differently: Place 2 sprays into both nostrils daily as needed for allergies.  01/13/18  Yes Saguier, Percell Miller, PA-C  folic acid-vitamin b complex-vitamin  c-selenium-zinc (DIALYVITE) 3 MG TABS tablet Take 1 tablet by mouth daily.   Yes [provider]  Homeopathic Products (LEG CRAMP RELIEF PO) Take 1 tablet by mouth daily as needed (leg cramps).    Yes [provider]  lisinopril (PRINIVIL,ZESTRIL) 40 MG tablet TAKE 1 TABLET BY MOUTH ONCE DAILY Patient taking differently: Take 40 mg by mouth daily.  11/07/17  Yes Copland, Gay Filler, MD  NIFEdipine (ADALAT CC) 90 MG 24 hr tablet TAKE 1 TABLET BY MOUTH ONCE DAILY Patient taking differently: Take 90 mg by mouth daily.  08/24/17  Yes Copland, Gay Filler, MD  ondansetron (ZOFRAN ODT) 4 MG disintegrating tablet Take 1 tablet (4 mg total) by mouth every 8 (eight) hours as needed for nausea or vomiting. 03/07/18  Yes Yu, Amy V, PA-C  oxyCODONE (OXY IR/ROXICODONE) 5 MG immediate release tablet Take 1 tablet (5  mg total) by mouth every 6 (six) hours as needed for severe pain. 02/25/18  Yes Shelda Pal, DO  pioglitazone (ACTOS) 30 MG tablet TAKE 1 TABLET BY MOUTH ONCE DAILY Patient taking differently: Take 30 mg by mouth daily.  02/01/18  Yes Copland, Gay Filler, MD  sertraline (ZOLOFT) 100 MG tablet TAKE 2 TABLETS BY MOUTH ONCE DAILY Patient taking differently: Take 200 mg by mouth daily.  01/30/18  Yes Copland, Gay Filler, MD  simvastatin (ZOCOR) 40 MG tablet TAKE 1 TABLET BY MOUTH ONCE DAILY Patient taking differently: Take 40 mg by mouth every morning.  08/22/17  Yes Copland, Gay Filler, MD   Liver Function Tests Recent Labs  Lab 04/03/18 1958  AST 23  ALT 6  ALKPHOS 67  BILITOT 0.8  PROT 7.5  ALBUMIN 3.5   No results for input(s): LIPASE, AMYLASE in the last 168 hours. CBC Recent Labs  Lab 04/03/18 1958 04/04/18 0510  WBC 9.5 8.5  NEUTROABS 8.1*  --   HGB 10.8* 10.0*  HCT 34.3* 33.0*  MCV 107.5* 108.9*  PLT 192 431   Basic Metabolic Panel Recent Labs  Lab 04/03/18 1958 04/04/18 0510  NA 137 138  K 3.8 3.9  CL 94* 98  CO2 31 33*  GLUCOSE 180* 113*  BUN 19 31*  CREATININE 3.60* 4.25*  CALCIUM 9.0 8.5*   Iron/TIBC/Ferritin/ %Sat    Component Value Date/Time   IRON 150 02/23/2018 1316   TIBC 349 02/23/2018 1316   FERRITIN 189 02/23/2018 1316   IRONPCTSAT 43 (H) 02/23/2018 1316    Vitals:   04/04/18 1900 04/04/18 2042 04/05/18 0401 04/05/18 0808  BP:  132/63 (!) 143/72 138/73  Pulse:  (!) 52 66 66  Resp:  $Remo'19 16 18  'VuwbW$ Temp:  99.1 F (37.3 C) 98.2 F (36.8 C) 98.8 F (37.1 C)  TempSrc:  Oral Oral Oral  SpO2:  95% 98% 98%  Weight: 81 kg     Height:       Exam Gen alert, no distress No rash, cyanosis or gangrene Sclera anicteric, throat clear  No jvd or bruits Chest clear bilat RRR no MRG Abd soft ntnd no mass or ascites +bs GU deferred MS no joint effusions or deformity Ext no LE edema, no wounds or ulcers Neuro is alert, Ox 3 , nf  Scars from  old LUE access failure R forearm AV graft mild diffuse erythema, no drainage or fluctuance R IJ TDC clean and dry exit site    Home meds:  - allopurinol/ aspirin 81/ oxycodone IR/ sertraline 200/ simvastatin 40 qd  - nifedipine 90  qd/ lisinopril 40   Dialysis: [facility]  4h  78kg   2/2.25 bath  R IJ TDC/ maturing RUE AVG (placed [place] 03/30/18) Heparin 3000   - hect 2 ug - mircera 50 ug on 11/11     Impression/ Plan: 1. R arm AVG pain/ erythema - blood cx's negative, VVS following 2. ESRD - HD via [place] until AVG ready.  HD tonight. Will follow.  3. HTN 4. Hx hep C 5. Anemia ckd - Hb 10, had esa this week already   Kelly Splinter MD [facility] pager [phone]   04/05/2018, 4:16 PM

## 2018-04-05 NOTE — Progress Notes (Signed)
Initial Nutrition Assessment  DOCUMENTATION CODES:   Non-severe (moderate) malnutrition in context of chronic illness  INTERVENTION:   -Continue Nepro Shake po BID, each supplement provides 425 kcal and 19 grams protein -Renal MVI daily  NUTRITION DIAGNOSIS:   Moderate Malnutrition related to chronic illness(ESRD on HD) as evidenced by energy intake < or equal to 75% for > or equal to 1 month, mild fat depletion, mild muscle depletion.  GOAL:   Patient will meet greater than or equal to 90% of their needs  MONITOR:   PO intake, Supplement acceptance, Labs, Weight trends, Skin, I & O's  REASON FOR ASSESSMENT:   Malnutrition Screening Tool    ASSESSMENT:   Scott Jones is a 72 y.o. male with medical history significant of ESRD, just had dialysis graft placed on Thurs this past week at the [place].  Since discharge on Friday has developed pain, erythema, swelling, at R fore arm, associated fevers at home.  Was able to undergo dialysis today without difficulty however had worsening pain after dialysis today.  Went to [facility] for complaint and got sent to ED.  Pt admitted with infection of AV HD graft.   Spoke with pt at bedside, who was in good spirits today. He was consuming lunch of vegetable lasagna at time of visit, but with difficulty cutting food due to arm pain- RD assisted pt in cutting up food for ease of intake (pt consumed approximately 75% of lasagna by the time RD concluded visit). Also observed breakfast tray- pt consumed 50% of sausage and french toast and 100% of oatmeal).   Per pt, he has had a decreased appetite over the past month since prior to starting HD treatments ("I'm definitely not cleaning my plate anymore"). He reports on non-HD days, he consumes 3 meals per day (Breakfast: corn flakes with 1/2 cup of milk OR oatmeal with fruit; Lunch: sandwich OR meat with vegetables; Dinner: meat, starch, and vegetable). Pt reports he has been struggling with intake during HD  days due to timing of treatments (usually missed lunch and only eats a small breakfast), but will eat a large meal and a possible snack after HD depending on how he feels. He has not been offered any supplements at HD center, but reports "they told me I need to eat more protein".   Pt shares he has experienced a 10# weight loss over the past 4-6 weeks, which he suspects is due to decreased intake. He has been tolerating his HD treatments well. Reviewed wt hx; pt has experienced a 3.7% wt loss over the past 3 months, which is not significant for time frame. He is unsure of his dry weight. Per discussion with RN, he will receive HD today.   Pt with good basic knowledge regarding renal diet. He was able to teach back low sodium, low potassium, and low phosphorus diet to RD. Pt reports his wife does not of the cooking and both are diligent about monitoring diet restrictions. Discussed with pt importance of good meal and supplement intake to promote healing. Encouraged pt to try Nepro shake.   Last Hgb A1c: 5.9 (09/28/17). PTA DM medications are 30 mg actos daily.   Labs reviewed: CBGS: 90-155 (inpatient orders for glycemic control are 0-9 units insulin aspart every 4 hours).   NUTRITION - FOCUSED PHYSICAL EXAM:    Most Recent Value  Orbital Region  No depletion  Upper Arm Region  Mild depletion  Thoracic and Lumbar Region  No depletion  Buccal  Region  Mild depletion  Temple Region  Mild depletion  Clavicle Bone Region  Mild depletion  Clavicle and Acromion Bone Region  Mild depletion  Scapular Bone Region  Mild depletion  Dorsal Hand  Mild depletion  Patellar Region  Mild depletion  Anterior Thigh Region  Mild depletion  Posterior Calf Region  Mild depletion  Edema (RD Assessment)  Mild  Hair  Reviewed  Eyes  Reviewed  Mouth  Reviewed  Skin  Reviewed  Nails  Reviewed       Diet Order:   Diet Order            Diet renal with fluid restriction Fluid restriction: 1200 mL Fluid; Room  service appropriate? Yes; Fluid consistency: Thin  Diet effective now              EDUCATION NEEDS:   Education needs have been addressed  Skin:  Skin Assessment: Reviewed RN Assessment  Last BM:  04/03/18  Height:   Ht Readings from Last 1 Encounters:  04/03/18 $RemoveB'5\' 5"'QgwhCeyT$  (1.651 m)    Weight:   Wt Readings from Last 1 Encounters:  04/04/18 81 kg    Ideal Body Weight:  61.8 kg  BMI:  Body mass index is 29.72 kg/m.  Estimated Nutritional Needs:   Kcal:  1850-2050  Protein:  90-105 grams  Fluid:  1000 ml + UOP    Scott Jones A. Jimmye Norman, RD, LDN, CDE Pager: [phone] After hours Pager: [phone]

## 2018-04-05 NOTE — Progress Notes (Signed)
PROGRESS NOTE    Chuckie Mccathern  WNU:[id number] DOB: 1945-12-04 DOA: 04/03/2018 PCP: System, Provider Not In   Brief Narrative: Fady Stamps is a 72 y.o. male with a history of ESRD with recent HD graft placement. He presented secondary to erythema/pain of his forearm concerning for cellulitis. He was started on Vancomycin and Ceftriaxone.   Assessment & Plan:   Principal Problem:   Infection of AV graft for dialysis ([place]) Active Problems:   DM (diabetes mellitus) ([place])   HTN (hypertension)   ESRD (end stage renal disease) ([place])   Cellulitis/Infected graft Improving on current regimen but, per vascular surgery, possibly this is just post-op pain/swelling -Transition to Cefdinir today  Essential hypertension -Continue nifedipine  Hyperlipidemia -Continue Zocor  Diabetes mellitus, type 2 -Continue SSI  ESRD on HD HD MWF. -Per nephrology  Anemia of chronic disease Stable -Per nephrology  Pressure Injury Documentation:     DVT prophylaxis: SCDs Code Status:   Code Status: Full Code Family Communication: None at bedside Disposition Plan: Likely discharge in 24 hours if continues to improve on oral   Consultants:   Nephrology  Vascular surgery  Procedures:   HD  Antimicrobials:  Vancomycin  Cefriaxone  Cefdinir    Subjective: No issues today. Pain improved.  Objective: Vitals:   04/04/18 1900 04/04/18 2042 04/05/18 0401 04/05/18 0808  BP:  132/63 (!) 143/72 138/73  Pulse:  (!) 52 66 66  Resp:  $Remo'19 16 18  'GssUy$ Temp:  99.1 F (37.3 C) 98.2 F (36.8 C) 98.8 F (37.1 C)  TempSrc:  Oral Oral Oral  SpO2:  95% 98% 98%  Weight: 81 kg     Height:        Intake/Output Summary (Last 24 hours) at 04/05/2018 1348 Last data filed at 04/05/2018 1332 Gross per 24 hour  Intake 462.79 ml  Output 775 ml  Net -312.21 ml   Filed Weights   04/03/18 1947 04/04/18 1900  Weight: 79.4 kg 81 kg    Examination:  General exam: Appears calm  and comfortable Respiratory system: Clear to auscultation. Respiratory effort normal. Cardiovascular system: S1 & S2 heard, RRR. No murmurs, rubs, gallops or clicks. Gastrointestinal system: Abdomen is nondistended, soft and nontender. No organomegaly or masses felt. Normal bowel sounds heard. Central nervous system: Alert and oriented. No focal neurological deficits. Extremities: No calf tenderness. Right forearm with mild erythema and swelling Skin: No cyanosis. No rashes Psychiatry: Judgement and insight appear normal. Mood & affect appropriate.     Data Reviewed: I have personally reviewed following labs and imaging studies  CBC: Recent Labs  Lab 04/03/18 1958 04/04/18 0510  WBC 9.5 8.5  NEUTROABS 8.1*  --   HGB 10.8* 10.0*  HCT 34.3* 33.0*  MCV 107.5* 108.9*  PLT 192 034   Basic Metabolic Panel: Recent Labs  Lab 04/03/18 1958 04/04/18 0510  NA 137 138  K 3.8 3.9  CL 94* 98  CO2 31 33*  GLUCOSE 180* 113*  BUN 19 31*  CREATININE 3.60* 4.25*  CALCIUM 9.0 8.5*   GFR: Estimated Creatinine Clearance: 15.2 mL/min (A) (by C-G formula based on SCr of 4.25 mg/dL (H)). Liver Function Tests: Recent Labs  Lab 04/03/18 1958  AST 23  ALT 6  ALKPHOS 67  BILITOT 0.8  PROT 7.5  ALBUMIN 3.5   No results for input(s): LIPASE, AMYLASE in the last 168 hours. No results for input(s): AMMONIA in the last 168 hours. Coagulation Profile: Recent Labs  Lab  04/03/18 1958  INR 0.97   Cardiac Enzymes: No results for input(s): CKTOTAL, CKMB, CKMBINDEX, TROPONINI in the last 168 hours. BNP (last 3 results) No results for input(s): PROBNP in the last 8760 hours. HbA1C: No results for input(s): HGBA1C in the last 72 hours. CBG: Recent Labs  Lab 04/04/18 2011 04/05/18 0107 04/05/18 0402 04/05/18 0808 04/05/18 1136  GLUCAP 111* 132* 96 90 190*   Lipid Profile: No results for input(s): CHOL, HDL, LDLCALC, TRIG, CHOLHDL, LDLDIRECT in the last 72 hours. Thyroid Function  Tests: No results for input(s): TSH, T4TOTAL, FREET4, T3FREE, THYROIDAB in the last 72 hours. Anemia Panel: No results for input(s): VITAMINB12, FOLATE, FERRITIN, TIBC, IRON, RETICCTPCT in the last 72 hours. Sepsis Labs: Recent Labs  Lab 04/03/18 2014 04/03/18 2144  LATICACIDVEN 1.35 1.08    Recent Results (from the past 240 hour(s))  Culture, blood (Routine x 2)     Status: None (Preliminary result)   Collection Time: 04/03/18  7:59 PM  Result Value Ref Range Status   Specimen Description BLOOD LEFT ANTECUBITAL  Final   Special Requests   Final    BOTTLES DRAWN AEROBIC AND ANAEROBIC Blood Culture adequate volume   Culture   Final    NO GROWTH 2 DAYS Performed at [facility], [place] [address], [place], [place] [address]    Report Status PENDING  Incomplete  Culture, blood (Routine x 2)     Status: None (Preliminary result)   Collection Time: 04/03/18  9:29 PM  Result Value Ref Range Status   Specimen Description BLOOD LEFT FOREARM  Final   Special Requests   Final    BOTTLES DRAWN AEROBIC AND ANAEROBIC Blood Culture adequate volume   Culture   Final    NO GROWTH 2 DAYS Performed at [facility], [place] [address] [place], [place] [address]    Report Status PENDING  Incomplete  Urine culture     Status: None   Collection Time: 04/03/18 10:32 PM  Result Value Ref Range Status   Specimen Description URINE, RANDOM  Final   Special Requests NONE  Final   Culture   Final    NO GROWTH Performed at [facility], [place] [address], [place], [place] [address]    Report Status 04/05/2018 FINAL  Final  Culture, blood (single) w Reflex to ID Panel     Status: None (Preliminary result)   Collection Time: 04/04/18  9:00 AM  Result Value Ref Range Status   Specimen Description BLOOD LEFT FOREARM  Final   Special Requests   Final    BOTTLES DRAWN AEROBIC AND ANAEROBIC Blood Culture results may not be optimal due to an inadequate volume of blood received in culture bottles    Culture   Final    NO GROWTH < 24 HOURS Performed at [facility], [place] [address], [place], [place] [address]    Report Status PENDING  Incomplete  Culture, blood (single) w Reflex to ID Panel     Status: None (Preliminary result)   Collection Time: 04/04/18  9:15 AM  Result Value Ref Range Status   Specimen Description BLOOD LEFT HAND  Final   Special Requests   Final    BOTTLES DRAWN AEROBIC AND ANAEROBIC Blood Culture adequate volume   Culture   Final    NO GROWTH < 24 HOURS Performed at [facility], [place] [address], [place], [place] [address]    Report Status PENDING  Incomplete  MRSA PCR Screening     Status: None   Collection Time: 04/04/18 12:40 PM  Result Value Ref Range Status   MRSA by PCR NEGATIVE NEGATIVE Final    Comment:        The GeneXpert MRSA Assay (FDA approved for NASAL specimens only), is one component of a comprehensive MRSA colonization surveillance program. It is not intended to diagnose MRSA infection nor to guide or monitor treatment for MRSA infections. Performed at [facility], [place] [address] [place], [place] [address]          Radiology Studies: Dg Chest 2 View  Result Date: 04/03/2018 CLINICAL DATA:  Fever.  End-stage renal disease EXAM: CHEST - 2 VIEW COMPARISON:  February 25, 2018 FINDINGS: Central catheter tip is in the right atrium. No pneumothorax. There is atelectatic change in the right base. Lungs elsewhere are clear. Heart size and pulmonary vascularity are normal. No adenopathy. There is degenerative change in thoracic spine. IMPRESSION: Atelectatic change right base. No edema or consolidation. Heart size normal. Central catheter tip in right atrium. No pneumothorax. Electronically Signed   By: Lowella Grip III M.D.   On: 04/03/2018 20:49        Scheduled Meds: . allopurinol  100 mg Oral Daily  . feeding supplement (NEPRO CARB STEADY)  237 mL Oral BID BM  . insulin aspart  0-9 Units Subcutaneous Q4H    . multivitamin  1 tablet Oral QHS  . NIFEdipine  90 mg Oral Daily  . sertraline  200 mg Oral Daily  . simvastatin  40 mg Oral Daily   Continuous Infusions: . sodium chloride Stopped (04/05/18 0132)  . cefTRIAXone (ROCEPHIN)  IV Stopped (04/05/18 0132)  . vancomycin       LOS: 2 days     Cordelia Poche, MD [facility] 04/05/2018, 1:48 PM  If 7PM-7AM, please contact night-coverage [email or web address]

## 2018-04-06 ENCOUNTER — Encounter (HOSPITAL_COMMUNITY): Payer: PPO

## 2018-04-06 LAB — GLUCOSE, CAPILLARY
GLUCOSE-CAPILLARY: 106 mg/dL — AB (ref 70–99)
GLUCOSE-CAPILLARY: 129 mg/dL — AB (ref 70–99)
Glucose-Capillary: 162 mg/dL — ABNORMAL HIGH (ref 70–99)
Glucose-Capillary: 106 mg/dL — ABNORMAL HIGH (ref 70–99)

## 2018-04-06 MED ORDER — DICLOFENAC SODIUM 1 % TD GEL
2.0000 g | Freq: Four times a day (QID) | TRANSDERMAL | Status: DC
  Administered 2018-04-06: 2 g via TOPICAL
  Filled 2018-04-06: qty 100

## 2018-04-06 MED ORDER — NIFEDIPINE ER 60 MG PO TB24: 60.0000 mg | ORAL_TABLET | Freq: Every day | ORAL | 0 refills | Status: DC

## 2018-04-06 MED ORDER — CEFDINIR 300 MG PO CAPS: 300.0000 mg | ORAL_CAPSULE | ORAL | 0 refills | Status: DC

## 2018-04-06 MED ORDER — NIFEDIPINE ER OSMOTIC RELEASE 60 MG PO TB24: 60.0000 mg | ORAL_TABLET | Freq: Every day | ORAL | Status: DC

## 2018-04-06 MED ORDER — HEPARIN SODIUM (PORCINE) 1000 UNIT/ML IJ SOLN
4000.0000 [IU] | INTRAMUSCULAR | Status: DC
  Administered 2018-04-06: 3200 [IU]

## 2018-04-06 MED ORDER — DICLOFENAC SODIUM 1 % TD GEL: 2.0000 g | Freq: Four times a day (QID) | TRANSDERMAL | 0 refills | Status: DC

## 2018-04-06 MED ORDER — NEPRO/CARBSTEADY PO LIQD: 237.0000 mL | Freq: Two times a day (BID) | ORAL | 0 refills | Status: DC

## 2018-04-06 MED ORDER — HEPARIN SODIUM (PORCINE) 1000 UNIT/ML IJ SOLN
INTRAMUSCULAR | Status: AC
  Administered 2018-04-06: 3200 [IU]
  Filled 2018-04-06: qty 4

## 2018-04-06 MED ORDER — DOXERCALCIFEROL 4 MCG/2ML IV SOLN
INTRAVENOUS | Status: AC
  Administered 2018-04-06: 3 ug via INTRAVENOUS
  Filled 2018-04-06: qty 2

## 2018-04-06 NOTE — Progress Notes (Addendum)
Subjective:   Hd last pm some cramping  End of HD / 1982 cc uf / dw NO BP Meds PRE HD at Home and Decrease Procardia 90 to $Rem'60mg'YIuY$  hs   Objective Vital signs in last 24 hours: Vitals:   04/06/18 0015 04/06/18 0054 04/06/18 0526 04/06/18 0738  BP: (!) 144/70 131/77 (!) 110/43 129/69  Pulse: 83 76 77 65  Resp: $Remo'16 18 20 18  'qGWBT$ Temp: 97.8 F (36.6 C) 97.6 F (36.4 C) 98.5 F (36.9 C)   TempSrc: Oral  Oral   SpO2: 99% 99% 99% 100%  Weight: 74.4 kg     Height:       Weight change: -4.2 kg  Physical Exam: General:alert , NAD , stiiing in chair  Heart: RRR  Lungs: CTA Abdomen: Soft ,NT, ND Extremities: pos bruit RFA AVGG with diffuse erythema mild improvement   Dialysis Access:  RIJ P.cath   Home meds:  - allopurinol/ aspirin 81/ oxycodone IR/ sertraline 200/ simvastatin 40 qd  - nifedipine 90 qd/ lisinopril 40   Dialysis: [facility]  4h  78kg   2/2.25 bath  R IJ TDC/ maturing RUE AVG (placed [place] 03/30/18) Heparin 3000   - hect 2 ug - mircera 50 ug on 11/11  Problem/Plan: 1. R arm  AVG Pain /erythema BC -,VVS" dc on po antibiotics  /FU  [facility] surgeon  Or VVS in 3-4 wks" 2. ESRD - HD  On schedule MWF. Cramped a lot on HD last night, under dry wt, let vol come back up in OP setting , 77-78kg.  3. HTN/volume - HD  yest with [phone] and cramping end of hd bp stable 129/69  Post hd   Below edw post wt 74.4 / AT DC Decease Procardia xl 90 to 60 mg and take HS ,Lisonpril at home told to stop 4. Anemia - hgb 10 [facility]  5. Secondary hyperparathyroidism - hect on hd  . tums as binder   Ernest Haber, PA-C [place] [phone] 04/06/2018,10:34 AM  LOS: 3 days   Pt seen, examined, agree w assess/plan as above with additions as indicated.  Kelly Splinter MD [facility] pager [phone]    cell [phone] 04/06/2018, 11:22 AM     Labs: Basic Metabolic Panel: Recent Labs  Lab 04/03/18 1958 04/04/18 0510  NA 137  138  K 3.8 3.9  CL 94* 98  CO2 31 33*  GLUCOSE 180* 113*  BUN 19 31*  CREATININE 3.60* 4.25*  CALCIUM 9.0 8.5*   Liver Function Tests: Recent Labs  Lab 04/03/18 1958  AST 23  ALT 6  ALKPHOS 67  BILITOT 0.8  PROT 7.5  ALBUMIN 3.5   No results for input(s): LIPASE, AMYLASE in the last 168 hours. No results for input(s): AMMONIA in the last 168 hours. CBC: Recent Labs  Lab 04/03/18 1958 04/04/18 0510  WBC 9.5 8.5  NEUTROABS 8.1*  --   HGB 10.8* 10.0*  HCT 34.3* 33.0*  MCV 107.5* 108.9*  PLT 192 182   Cardiac Enzymes: No results for input(s): CKTOTAL, CKMB, CKMBINDEX, TROPONINI in the last 168 hours. CBG: Recent Labs  Lab 04/05/18 1136 04/05/18 1637 04/06/18 0054 04/06/18 0415 04/06/18 0734  GLUCAP 190* 124* 106* 162* 106*    Studies/Results: No results found. Medications: . sodium chloride Stopped (04/05/18 0132)   . allopurinol  100 mg Oral Daily  . cefdinir  300 mg Oral Q M,W,F-1800  . Chlorhexidine Gluconate Cloth  6 each Topical Q0600  . diclofenac sodium  2 g Topical QID  . [START ON 04/07/2018] doxercalciferol  3 mcg Intravenous Q M,W,F-HD  . feeding supplement (NEPRO CARB STEADY)  237 mL Oral BID BM  . heparin  4,000 Units Intracatheter Q M,W,F-HD  . insulin aspart  0-9 Units Subcutaneous Q4H  . multivitamin  1 tablet Oral QHS  . [START ON 04/07/2018] NIFEdipine  60 mg Oral QHS  . sertraline  200 mg Oral Daily  . simvastatin  40 mg Oral Daily

## 2018-04-06 NOTE — Progress Notes (Signed)
[facility]   Subjective  - feels a little better   Objective 129/69 65 98.5 F (36.9 C) (Oral) 18 100%  Intake/Output Summary (Last 24 hours) at 04/06/2018 0934 Last data filed at 04/06/2018 0006 Gross per 24 hour  Intake 240 ml  Output 2382 ml  Net -2142 ml   Erythema over graft less tender less swelling  Assessment/Planning: Slowly improving pain swelling right arm AV graft.  Still with some erythema but clinically better Afebrile since admission  Would d/c home to complete 7-10 day course antibiotics.   He can follow up in 3-4 weeks with [place] or with his surgeon in [place].  Ruta Hinds 04/06/2018 9:34 AM --  Laboratory Lab Results: Recent Labs    04/03/18 1958 04/04/18 0510  WBC 9.5 8.5  HGB 10.8* 10.0*  HCT 34.3* 33.0*  PLT 192 182   BMET Recent Labs    04/03/18 1958 04/04/18 0510  NA 137 138  K 3.8 3.9  CL 94* 98  CO2 31 33*  GLUCOSE 180* 113*  BUN 19 31*  CREATININE 3.60* 4.25*  CALCIUM 9.0 8.5*    COAG Lab Results  Component Value Date   INR 0.97 04/03/2018   No results found for: PTT

## 2018-04-06 NOTE — Care Management Important Message (Signed)
Important Message  Patient Details  Name: Scott Jones MRN: [id number] Date of Birth: 08/04/1945   Medicare Important Message Given:  Yes    Orbie Pyo 04/06/2018, 3:06 PM

## 2018-04-06 NOTE — Discharge Summary (Addendum)
Physician Discharge Summary  Scott Jones BZJ:[id number] DOB: [birth year] DOA: 04/03/2018  PCP: System, Provider Not In  Admit date: 04/03/2018 Discharge date: 04/06/2018  Admitted From: Home Disposition: Home  Recommendations for Outpatient Follow-up:  1. Follow up with PCP in 1 week 2. Follow up with vascular surgery in 3 weeks or as previously scheduled 3. Please follow up on the following pending results: Blood culture (final result)  Home Health: None Equipment/Devices: None  Discharge Condition: Stable CODE STATUS: Full code Diet recommendation: Renal diet   Brief/Interim Summary:  Admission HPI written by Scott Quill, DO   Chief Complaint: Fever, R forearm swellign  HPI: Scott Jones is a 72 y.o. male with medical history significant of ESRD, just had dialysis graft placed on Thurs this past week at the [place].  Since discharge on Friday has developed pain, erythema, swelling, at R fore arm, associated fevers at home.  Was able to undergo dialysis today without difficulty however had worsening pain after dialysis today.  Went to [facility] for complaint and got sent to ED.   ED Course: Put on Vanc.  Tm 101.6.  WBC 9.5k.   Hospital course:  Cellulitis/Infected graft Improving on current regimen but, per vascular surgery, possibly this is just post-op pain/swelling. Discharged on a week of Cefdinir.  Essential hypertension Changed to Nifedipine 60 mg qhs. Lisinopril discontinued on discharge.  Hyperlipidemia Continue Zocor  Diabetes mellitus, type 2 Sliding scale insulin while inpatient. Last hemoglobin A1C of 5.9%. Not currently on treatment.  ESRD on HD HD MWF. Per nephrology  Anemia of chronic disease Stable. Per nephrology  Discharge Diagnoses:  Principal Problem:   Infection of AV graft for dialysis [facility]) Active Problems:   DM (diabetes mellitus) ([place])   HTN (hypertension)   ESRD (end stage renal disease) [facility])    Discharge  Instructions  Discharge Instructions    Call MD for:  redness, tenderness, or signs of infection (pain, swelling, redness, odor or green/yellow discharge around incision site)   Complete by:  As directed    Call MD for:  severe uncontrolled pain   Complete by:  As directed    Increase activity slowly   Complete by:  As directed      Allergies as of 04/06/2018   No Known Allergies     Medication List    STOP taking these medications   lisinopril 40 MG tablet Commonly known as:  PRINIVIL,ZESTRIL     TAKE these medications   allopurinol 100 MG tablet Commonly known as:  ZYLOPRIM TAKE 1 TABLET BY MOUTH ONCE DAILY   aspirin 81 MG tablet Take 81 mg by mouth daily.   calcium carbonate 500 MG chewable tablet Commonly known as:  TUMS - dosed in mg elemental calcium Chew 1 tablet by mouth 3 (three) times daily with meals.   cefdinir 300 MG capsule Commonly known as:  OMNICEF Take 1 capsule (300 mg total) by mouth every Monday, Wednesday, and Friday at 6 PM for 7 days. Start taking on:  04/07/2018   diclofenac sodium 1 % Gel Commonly known as:  VOLTAREN Apply 2 g topically 4 (four) times daily.   docusate sodium 100 MG capsule Commonly known as:  COLACE Take 100 mg by mouth daily as needed for mild constipation.   feeding supplement (NEPRO CARB STEADY) Liqd Take 237 mLs by mouth 2 (two) times daily between meals.   fluticasone 50 MCG/ACT nasal spray Commonly known as:  FLONASE Place 2  sprays into both nostrils daily. What changed:    when to take this  reasons to take this   folic acid-vitamin b complex-vitamin c-selenium-zinc 3 MG Tabs tablet Take 1 tablet by mouth daily.   LEG CRAMP RELIEF PO Take 1 tablet by mouth daily as needed (leg cramps).   NIFEdipine 60 MG 24 hr tablet Commonly known as:  ADALAT CC Take 1 tablet (60 mg total) by mouth at bedtime. Start taking on:  04/07/2018 What changed:    medication strength  how much to take  when to take  this   ondansetron 4 MG disintegrating tablet Commonly known as:  ZOFRAN-ODT Take 1 tablet (4 mg total) by mouth every 8 (eight) hours as needed for nausea or vomiting.   oxyCODONE 5 MG immediate release tablet Commonly known as:  Oxy IR/ROXICODONE Take 1 tablet (5 mg total) by mouth every 6 (six) hours as needed for severe pain.   pioglitazone 30 MG tablet Commonly known as:  ACTOS TAKE 1 TABLET BY MOUTH ONCE DAILY   sertraline 100 MG tablet Commonly known as:  ZOLOFT TAKE 2 TABLETS BY MOUTH ONCE DAILY   simvastatin 40 MG tablet Commonly known as:  ZOCOR TAKE 1 TABLET BY MOUTH ONCE DAILY What changed:  when to take this      Follow-up Information    Elam Dutch, MD. Schedule an appointment as soon as possible for a visit in 3 week(s).   Specialties:  Vascular Surgery, Cardiology Contact information: [place] [address] [phone]          No Known Allergies  Consultations:  Nephrology  Vascular surgery   Procedures/Studies: Dg Chest 2 View  Result Date: 04/03/2018 CLINICAL DATA:  Fever.  End-stage renal disease EXAM: CHEST - 2 VIEW COMPARISON:  February 25, 2018 FINDINGS: Central catheter tip is in the right atrium. No pneumothorax. There is atelectatic change in the right base. Lungs elsewhere are clear. Heart size and pulmonary vascularity are normal. No adenopathy. There is degenerative change in thoracic spine. IMPRESSION: Atelectatic change right base. No edema or consolidation. Heart size normal. Central catheter tip in right atrium. No pneumothorax. Electronically Signed   By: Lowella Grip III M.D.   On: 04/03/2018 20:49   Ir Fluoro Guide Cv Line Right  Result Date: 03/18/2018 INDICATION: End-stage renal disease, in need of durable intravenous access for the initiation of dialysis Patient has a chronic left upper arm hybrid fistula/graft however this has never been successfully utilized for dialysis and recent attempt at  dialysis access has resulted in graft/fistula thrombosis. As such, patient presents today for image guided dialysis catheter placement. EXAM: TUNNELED CENTRAL VENOUS HEMODIALYSIS CATHETER PLACEMENT WITH ULTRASOUND AND FLUOROSCOPIC GUIDANCE MEDICATIONS: Ancef 2 gm IV . The antibiotic was given in an appropriate time interval prior to skin puncture. ANESTHESIA/SEDATION: Moderate (conscious) sedation was employed during this procedure. A total of Versed 1.5 mg and Fentanyl 75 mcg was administered intravenously. Moderate Sedation Time: 17 minutes. The patient's level of consciousness and vital signs were monitored continuously by radiology nursing throughout the procedure under my direct supervision. FLUOROSCOPY TIME:  36 seconds (6.4 mGy) COMPLICATIONS: None immediate. PROCEDURE: Informed written consent was obtained from the patient after a discussion of the risks, benefits, and alternatives to treatment. Questions regarding the procedure were encouraged and answered. The right neck and chest were prepped with chlorhexidine in a sterile fashion, and a sterile drape was applied covering the operative field. Maximum barrier sterile technique with sterile  gowns and gloves were used for the procedure. A timeout was performed prior to the initiation of the procedure. After creating a small venotomy incision, a micropuncture kit was utilized to access the internal jugular vein. Real-time ultrasound guidance was utilized for vascular access including the acquisition of a permanent ultrasound image documenting patency of the accessed vessel. The microwire was utilized to measure appropriate catheter length. A stiff Glidewire was advanced to the level of the IVC and the micropuncture sheath was exchanged for a peel-away sheath. A palindrome tunneled hemodialysis catheter measuring 19 cm from tip to cuff was tunneled in a retrograde fashion from the anterior chest wall to the venotomy incision. The catheter was then placed  through the peel-away sheath with tips ultimately positioned within the superior aspect of the right atrium. Final catheter positioning was confirmed and documented with a spot radiographic image. The catheter aspirates and flushes normally. The catheter was flushed with appropriate volume heparin dwells. The catheter exit site was secured with a 0-Prolene retention suture. The venotomy incision was closed with an interrupted 4-0 Vicryl, Dermabond and Steri-strips. Dressings were applied. The patient tolerated the procedure well without immediate post procedural complication. IMPRESSION: Successful placement of 19 cm tip to cuff tunneled hemodialysis catheter via the right internal jugular vein with tips terminating within the superior aspect of the right atrium. The catheter is ready for immediate use. Electronically Signed   By: Sandi Mariscal M.D.   On: 03/18/2018 14:35   Ir US Guide Vasc Access Right  Result Date: 03/18/2018 INDICATION: End-stage renal disease, in need of durable intravenous access for the initiation of dialysis Patient has a chronic left upper arm hybrid fistula/graft however this has never been successfully utilized for dialysis and recent attempt at dialysis access has resulted in graft/fistula thrombosis. As such, patient presents today for image guided dialysis catheter placement. EXAM: TUNNELED CENTRAL VENOUS HEMODIALYSIS CATHETER PLACEMENT WITH ULTRASOUND AND FLUOROSCOPIC GUIDANCE MEDICATIONS: Ancef 2 gm IV . The antibiotic was given in an appropriate time interval prior to skin puncture. ANESTHESIA/SEDATION: Moderate (conscious) sedation was employed during this procedure. A total of Versed 1.5 mg and Fentanyl 75 mcg was administered intravenously. Moderate Sedation Time: 17 minutes. The patient's level of consciousness and vital signs were monitored continuously by radiology nursing throughout the procedure under my direct supervision. FLUOROSCOPY TIME:  36 seconds (6.4 mGy)  COMPLICATIONS: None immediate. PROCEDURE: Informed written consent was obtained from the patient after a discussion of the risks, benefits, and alternatives to treatment. Questions regarding the procedure were encouraged and answered. The right neck and chest were prepped with chlorhexidine in a sterile fashion, and a sterile drape was applied covering the operative field. Maximum barrier sterile technique with sterile gowns and gloves were used for the procedure. A timeout was performed prior to the initiation of the procedure. After creating a small venotomy incision, a micropuncture kit was utilized to access the internal jugular vein. Real-time ultrasound guidance was utilized for vascular access including the acquisition of a permanent ultrasound image documenting patency of the accessed vessel. The microwire was utilized to measure appropriate catheter length. A stiff Glidewire was advanced to the level of the IVC and the micropuncture sheath was exchanged for a peel-away sheath. A palindrome tunneled hemodialysis catheter measuring 19 cm from tip to cuff was tunneled in a retrograde fashion from the anterior chest wall to the venotomy incision. The catheter was then placed through the peel-away sheath with tips ultimately positioned within the superior aspect of the  right atrium. Final catheter positioning was confirmed and documented with a spot radiographic image. The catheter aspirates and flushes normally. The catheter was flushed with appropriate volume heparin dwells. The catheter exit site was secured with a 0-Prolene retention suture. The venotomy incision was closed with an interrupted 4-0 Vicryl, Dermabond and Steri-strips. Dressings were applied. The patient tolerated the procedure well without immediate post procedural complication. IMPRESSION: Successful placement of 19 cm tip to cuff tunneled hemodialysis catheter via the right internal jugular vein with tips terminating within the superior  aspect of the right atrium. The catheter is ready for immediate use. Electronically Signed   By: Sandi Mariscal M.D.   On: 03/18/2018 14:35      Subjective: Some mild discomfort of right forearm.  Discharge Exam: Vitals:   04/06/18 0526 04/06/18 0738  BP: (!) 110/43 129/69  Pulse: 77 65  Resp: 20 18  Temp: 98.5 F (36.9 C)   SpO2: 99% 100%   Vitals:   04/06/18 0015 04/06/18 0054 04/06/18 0526 04/06/18 0738  BP: (!) 144/70 131/77 (!) 110/43 129/69  Pulse: 83 76 77 65  Resp: _0 Temp: 97.8 F (36.6 C) 97.6 F (36.4 C) 98.5 F (36.9 C)   TempSrc: Oral  Oral   SpO2: 99% 99% 99% 100%  Weight: 74.4 kg     Height:        General: Pt is alert, awake, not in acute distress Cardiovascular: RRR, S1/S2 +, no rubs, no gallops Respiratory: CTA bilaterally, no wheezing, no rhonchi Abdominal: Soft, NT, ND, bowel sounds + Extremities: no cyanosis. Site over fistula has a surrounding erythema and induration without significant tenderness.    The results of significant diagnostics from this hospitalization (including imaging, microbiology, ancillary and laboratory) are listed below for reference.     Microbiology: Recent Results (from the past 240 hour(s))  Culture, blood (Routine x 2)     Status: None (Preliminary result)   Collection Time: 04/03/18  7:59 PM  Result Value Ref Range Status   Specimen Description BLOOD LEFT ANTECUBITAL  Final   Special Requests   Final    BOTTLES DRAWN AEROBIC AND ANAEROBIC Blood Culture adequate volume   Culture   Final    NO GROWTH 3 DAYS Performed at [facility], [address], [place], [place] [address]    Report Status PENDING  Incomplete  Culture, blood (Routine x 2)     Status: None (Preliminary result)   Collection Time: 04/03/18  9:29 PM  Result Value Ref Range Status   Specimen Description BLOOD LEFT FOREARM  Final   Special Requests   Final    BOTTLES DRAWN AEROBIC AND ANAEROBIC Blood Culture adequate volume    Culture   Final    NO GROWTH 3 DAYS Performed at [facility], [place] [address] [place], [place] [address]    Report Status PENDING  Incomplete  Urine culture     Status: None   Collection Time: 04/03/18 10:32 PM  Result Value Ref Range Status   Specimen Description URINE, RANDOM  Final   Special Requests NONE  Final   Culture   Final    NO GROWTH Performed at [facility], [place] [address] [place], [place] [address]    Report Status 04/05/2018 FINAL  Final  Culture, blood (single) w Reflex to ID Panel     Status: None (Preliminary result)   Collection Time: 04/04/18  9:00 AM  Result Value Ref  Range Status   Specimen Description BLOOD LEFT FOREARM  Final   Special Requests   Final    BOTTLES DRAWN AEROBIC AND ANAEROBIC Blood Culture results may not be optimal due to an inadequate volume of blood received in culture bottles   Culture   Final    NO GROWTH 2 DAYS Performed at [facility], [place] [address] [place], [place] [address]    Report Status PENDING  Incomplete  Culture, blood (single) w Reflex to ID Panel     Status: None (Preliminary result)   Collection Time: 04/04/18  9:15 AM  Result Value Ref Range Status   Specimen Description BLOOD LEFT HAND  Final   Special Requests   Final    BOTTLES DRAWN AEROBIC AND ANAEROBIC Blood Culture adequate volume   Culture   Final    NO GROWTH 2 DAYS Performed at [facility], [place] [address] [place], [place] [address]    Report Status PENDING  Incomplete  MRSA PCR Screening     Status: None   Collection Time: 04/04/18 12:40 PM  Result Value Ref Range Status   MRSA by PCR NEGATIVE NEGATIVE Final    Comment:        The GeneXpert MRSA Assay (FDA approved for NASAL specimens only), is one component of a comprehensive MRSA colonization surveillance program. It is not intended to diagnose MRSA infection nor to guide or monitor treatment for MRSA infections. Performed at [facility], [place] [address], [place], [place] [address]      Labs: BNP (last 3 results) No results for input(s): BNP in the last 8760 hours. Basic Metabolic Panel: Recent Labs  Lab 04/03/18 1958 04/04/18 0510  NA 137 138  K 3.8 3.9  CL 94* 98  CO2 31 33*  GLUCOSE 180* 113*  BUN 19 31*  CREATININE 3.60* 4.25*  CALCIUM 9.0 8.5*   Liver Function Tests: Recent Labs  Lab 04/03/18 1958  AST 23  ALT 6  ALKPHOS 67  BILITOT 0.8  PROT 7.5  ALBUMIN 3.5   No results for input(s): LIPASE, AMYLASE in the last 168 hours. No results for input(s): AMMONIA in the last 168 hours. CBC: Recent Labs  Lab 04/03/18 1958 04/04/18 0510  WBC 9.5 8.5  NEUTROABS 8.1*  --   HGB 10.8* 10.0*  HCT 34.3* 33.0*  MCV 107.5* 108.9*  PLT 192 182   Cardiac Enzymes: No results for input(s): CKTOTAL, CKMB, CKMBINDEX, TROPONINI in the last 168 hours. BNP: Invalid input(s): POCBNP CBG: Recent Labs  Lab 04/05/18 1637 04/06/18 0054 04/06/18 0415 04/06/18 0734 04/06/18 1117  GLUCAP 124* 106* 162* 106* 129*   D-Dimer No results for input(s): DDIMER in the last 72 hours. Hgb A1c No results for input(s): HGBA1C in the last 72 hours. Lipid Profile No results for input(s): CHOL, HDL, LDLCALC, TRIG, CHOLHDL, LDLDIRECT in the last 72 hours. Thyroid function studies No results for input(s): TSH, T4TOTAL, T3FREE, THYROIDAB in the last 72 hours.  Invalid input(s): FREET3 Anemia work up No results for input(s): VITAMINB12, FOLATE, FERRITIN, TIBC, IRON, RETICCTPCT in the last 72 hours. Urinalysis    Component Value Date/Time   COLORURINE YELLOW 04/03/2018 2232   APPEARANCEUR CLEAR 04/03/2018 2232   LABSPEC 1.012 04/03/2018 2232   PHURINE 8.0 04/03/2018 2232   GLUCOSEU NEGATIVE 04/03/2018 2232   HGBUR NEGATIVE 04/03/2018 2232   BILIRUBINUR NEGATIVE 04/03/2018 [place] 04/03/2018 2232   PROTEINUR 100 (A) 04/03/2018 2232  UROBILINOGEN 0.2 03/07/2018 1439   NITRITE NEGATIVE 04/03/2018 2232   LEUKOCYTESUR  NEGATIVE 04/03/2018 2232   Sepsis Labs Invalid input(s): PROCALCITONIN,  WBC,  LACTICIDVEN Microbiology Recent Results (from the past 240 hour(s))  Culture, blood (Routine x 2)     Status: None (Preliminary result)   Collection Time: 04/03/18  7:59 PM  Result Value Ref Range Status   Specimen Description BLOOD LEFT ANTECUBITAL  Final   Special Requests   Final    BOTTLES DRAWN AEROBIC AND ANAEROBIC Blood Culture adequate volume   Culture   Final    NO GROWTH 3 DAYS Performed at [facility], [place] [address] [place], [place] [address]    Report Status PENDING  Incomplete  Culture, blood (Routine x 2)     Status: None (Preliminary result)   Collection Time: 04/03/18  9:29 PM  Result Value Ref Range Status   Specimen Description BLOOD LEFT FOREARM  Final   Special Requests   Final    BOTTLES DRAWN AEROBIC AND ANAEROBIC Blood Culture adequate volume   Culture   Final    NO GROWTH 3 DAYS Performed at [facility], [place] [address] [place], [place] [address]    Report Status PENDING  Incomplete  Urine culture     Status: None   Collection Time: 04/03/18 10:32 PM  Result Value Ref Range Status   Specimen Description URINE, RANDOM  Final   Special Requests NONE  Final   Culture   Final    NO GROWTH Performed at [facility], [place] [address] [place], [place] [address]    Report Status 04/05/2018 FINAL  Final  Culture, blood (single) w Reflex to ID Panel     Status: None (Preliminary result)   Collection Time: 04/04/18  9:00 AM  Result Value Ref Range Status   Specimen Description BLOOD LEFT FOREARM  Final   Special Requests   Final    BOTTLES DRAWN AEROBIC AND ANAEROBIC Blood Culture results may not be optimal due to an inadequate volume of blood received in culture bottles   Culture   Final    NO GROWTH 2 DAYS Performed at [facility], [place] [address] [place], [place] [address]    Report Status PENDING  Incomplete  Culture, blood (single) w Reflex to  ID Panel     Status: None (Preliminary result)   Collection Time: 04/04/18  9:15 AM  Result Value Ref Range Status   Specimen Description BLOOD LEFT HAND  Final   Special Requests   Final    BOTTLES DRAWN AEROBIC AND ANAEROBIC Blood Culture adequate volume   Culture   Final    NO GROWTH 2 DAYS Performed at [facility], [place] [address], [place], [place] [address]    Report Status PENDING  Incomplete  MRSA PCR Screening     Status: None   Collection Time: 04/04/18 12:40 PM  Result Value Ref Range Status   MRSA by PCR NEGATIVE NEGATIVE Final    Comment:        The GeneXpert MRSA Assay (FDA approved for NASAL specimens only), is one component of a comprehensive MRSA colonization surveillance program. It is not intended to diagnose MRSA infection nor to guide or monitor treatment for MRSA infections. Performed at [facility], [place] [address] [place], [place] [address]      SIGNED:   Cordelia Poche, MD [facility] 04/06/2018, 2:11 PM

## 2018-04-06 NOTE — Discharge Instructions (Addendum)
Scott Jones,  You were admitted because of your forearm. There was a concern for an infection of the graft, however, it is possibly this is inflammation relating to post op changes from your procedure. Nevertheless, you will be discharged with antibiotics. Please take as prescribed and follow-up with either Dr. Oneida Alar, or your own vascular surgeon. If your redness or pain worsens, please return for reevaluation.

## 2018-04-08 LAB — CULTURE, BLOOD (ROUTINE X 2)
CULTURE: NO GROWTH
Culture: NO GROWTH
SPECIAL REQUESTS: ADEQUATE
Special Requests: ADEQUATE

## 2018-04-10 ENCOUNTER — Other Ambulatory Visit: Payer: Self-pay

## 2018-04-10 NOTE — Patient Outreach (Signed)
[facility]) Care Management  [place]  04/10/2018  Scott Jones [birth year] [id number]   72 year old male outreached by [facility] services for a 30 day post discharge medication review.  PMHx includes, but not limited to, hypertension, h/o hepatitis C, diabetes mellitus, ESRD Stage IV, depression and dyslipidemia.   Successful telephone call attempt # 1 to [name].  HIPAA identifiers  Verified.  Patient was not at his residence and requested that I call him back tomorrow at around 1:30 pm.   Plan:  Outreach to patient at 1:30 on 11/19.  Joetta Manners, PharmD Clinical Pharmacist [place] [phone]

## 2018-04-11 ENCOUNTER — Other Ambulatory Visit: Payer: Self-pay

## 2018-04-11 ENCOUNTER — Ambulatory Visit: Payer: Self-pay

## 2018-04-11 LAB — CULTURE, BLOOD (SINGLE)
CULTURE: NO GROWTH
CULTURE: NO GROWTH
SPECIAL REQUESTS: ADEQUATE

## 2018-04-11 NOTE — Patient Outreach (Signed)
[facility]) Care Management  [place]  04/11/2018  Sankalp Ferrell 1946-03-18 [id number]  Reason for referral: 30 day post discharge medication review  Unsuccessful telephone call attempt # 1 to patient.     HIPAA compliant voicemail left requesting a return call.  Plan:  I will make another outreach attempt to patient within 3-4 business days.  Joetta Manners, PharmD Clinical Pharmacist [place] [phone]

## 2018-04-13 ENCOUNTER — Other Ambulatory Visit: Payer: Self-pay

## 2018-04-13 NOTE — Patient Outreach (Addendum)
[facility]) Care Management  [place]  04/13/2018  Scott Jones 1946/01/23 [id number]   Incoming call received from [name].  HIPAA identifiers verified.   Subjective: [name] reports that his dialysis graft continues to heal.   He states he has completed his antibiotics and is using Voltaren gel prn for fistula pain.  He states that the graph was still too swollen to use for dialysis yesterday.  He states he has not been monitoring his glucose as he should, but he plans to start checking it at least twice daily.  He reports he was told by one of his doctors that he should change from simvastatin to atorvastatin due to his real function.  Objective:  HgA1c 5.8% in 5/19. SCr. 4.25 mg/dL on 04/04/18  Current Medications: Current Outpatient Medications  Medication Sig Dispense Refill  . allopurinol (ZYLOPRIM) 100 MG tablet TAKE 1 TABLET BY MOUTH ONCE DAILY (Patient taking differently: Take 100 mg by mouth daily. ) 90 tablet 3  . aspirin 81 MG tablet Take 81 mg by mouth daily.    . calcium carbonate (TUMS - DOSED IN MG ELEMENTAL CALCIUM) 500 MG chewable tablet Chew 1 tablet by mouth 3 (three) times daily with meals. Takes as a calcium binder.    . diclofenac sodium (VOLTAREN) 1 % GEL Apply 2 g topically 4 (four) times daily. (Patient taking differently: Apply 2 g topically 4 (four) times daily. Uses prn) 1 Tube 0  . docusate sodium (COLACE) 100 MG capsule Take 100 mg by mouth daily as needed for mild constipation.     . folic acid-vitamin b complex-vitamin c-selenium-zinc (DIALYVITE) 3 MG TABS tablet Take 1 tablet by mouth at bedtime.     Marland Kitchen NIFEdipine (ADALAT CC) 60 MG 24 hr tablet Take 1 tablet (60 mg total) by mouth at bedtime. 30 tablet 0  . pioglitazone (ACTOS) 30 MG tablet TAKE 1 TABLET BY MOUTH ONCE DAILY (Patient taking differently: Take 30 mg by mouth daily. ) 90 tablet 1  . sertraline (ZOLOFT) 100 MG tablet TAKE 2 TABLETS BY MOUTH ONCE  DAILY (Patient taking differently: Take 100 mg by mouth daily. ) 180 tablet 3  . simvastatin (ZOCOR) 40 MG tablet TAKE 1 TABLET BY MOUTH ONCE DAILY (Patient taking differently: Take 40 mg by mouth daily at 6 PM. ) 90 tablet 1  . fluticasone (FLONASE) 50 MCG/ACT nasal spray Place 2 sprays into both nostrils daily. (Patient not taking: Reported on 04/13/2018) 16 g 1  . Nutritional Supplements (FEEDING SUPPLEMENT, NEPRO CARB STEADY,) LIQD Take 237 mLs by mouth 2 (two) times daily between meals. (Patient not taking: Reported on 04/13/2018) 60 Can 0  . ondansetron (ZOFRAN ODT) 4 MG disintegrating tablet Take 1 tablet (4 mg total) by mouth every 8 (eight) hours as needed for nausea or vomiting. (Patient not taking: Reported on 04/13/2018) 10 tablet 0   No current facility-administered medications for this visit.     Functional Status: In your present state of health, do you have any difficulty performing the following activities: 04/04/2018 08/18/2017  Hearing? Y N  Vision? N N  Difficulty concentrating or making decisions? N N  Walking or climbing stairs? Y N  Dressing or bathing? N N  Doing errands, shopping? N -  Some recent data might be hidden    Fall/Depression Screening: Fall Risk  01/13/2018 08/12/2016 12/25/2015  Falls in the past year? Yes Yes No  Number falls in past yr: - 1 -  Injury with Fall? Yes No -  Risk for fall due to : Other (Comment) - -  Follow up - Falls prevention discussed -   PHQ 2/9 Scores 01/13/2018 08/12/2016 12/25/2015 12/16/2014  PHQ - 2 Score - 0 0 0  Exception Documentation Patient refusal - - -   ASSESSMENT: Date Discharged from Hospital: 04/06/18 Date Medication Reconciliation Performed: 04/13/2018  Medications Discontinued at Discharge:   lisinopril  New Medications at Discharge:   Cefdinir -completed  Medications with Dose Adjustments at Discharge: . Nifedipine- decreased to 60 mg daily  Patient was recently discharged from hospital and all  medications have been reviewed.  Drugs sorted by system:  Neurologic/Psychologic: sertraline  Cardiovascular: aspirin, nifedipine, simvastatin  Pulmonary/Allergy: fluticasone  Gastrointestinal: ondansetron ODT   Endocrine: pioglitazone   Renal: calcium carbonate  Pain: diclofenac gel  Vitamins/Minerals/Supplements: Dialyvite  Miscellaneous: allopurinol  Medication Review Findings:  . Simvastatin- Max dose of 5 mg daily with close monitoring recommended.   Patient is on 40 mg daily.  Will contact PCP to see if medication can be changed to atorvastatin which does not require renal adjustment. . Allopurinol- monitor closely, Initial dose is 100 mg on alternate days given post dialysis, increase cautiously to 300 mg based on response.  It is dialyzable.   . Sertraline- patient is taking 100 mg daily.  He states he is less depressed and doses not need to take 200 mg daily.   PLAN: Message left at office of PCP, Dr. Silvestre Jones to see if statin can be changed.   Scott Jones, PharmD Clinical Pharmacist [place] [phone]

## 2018-04-14 ENCOUNTER — Other Ambulatory Visit: Payer: Self-pay

## 2018-04-14 ENCOUNTER — Ambulatory Visit: Payer: Self-pay

## 2018-04-14 DIAGNOSIS — E785 Hyperlipidemia, unspecified: Secondary | ICD-10-CM

## 2018-04-14 MED ORDER — ATORVASTATIN CALCIUM 40 MG PO TABS: 40.0000 mg | ORAL_TABLET | Freq: Every day | ORAL | 3 refills | Status: DC

## 2018-04-14 NOTE — Addendum Note (Signed)
Addended by: Lamar Blinks C on: 04/14/2018 04:47 PM   Modules accepted: Orders

## 2018-04-14 NOTE — Patient Outreach (Signed)
[facility]) Care Management  04/14/2018  Scott Jones 1945/05/28 [id number]  Incoming call from Dr. Silvestre Mesi.  She agrees to change his simvastatin to atorvastatin.  She states she will send in a new prescription to his pharmacy.  Successful outreach attempt to Scott Jones.  HIPAA identifiers verified.  Informed him that Dr. Edilia Bo is calling in atorvastatin and that he should discontinue his simvastatin.  He verbalized understanding and states he will pick it up when ready.  Joetta Manners, PharmD Clinical Pharmacist [place] [phone]

## 2018-04-14 NOTE — Telephone Encounter (Signed)
Called jennifer and let her know that I am changing med to atorvastatin  Will change rx now

## 2018-04-14 NOTE — Telephone Encounter (Signed)
Scott Jones, pharmacist with [facility] called and said that he is on dialysis. She said that since he has in stage renal disease that she needs his simvastatin (ZOCOR) 40 MG adjusted and changed to Atorvastatin. She said that Dr Lorelei Pont could make the decision on which milligram it needed to be changed to. She states that Atorvastatin does not have to be adjusted with kidney function. She would like to know if the nurse or Dr Lorelei Pont could call her back. She wanted to let the patient know something by the weekend.Call back @ [phone]

## 2018-04-17 ENCOUNTER — Ambulatory Visit: Payer: Self-pay

## 2018-04-24 DIAGNOSIS — D509 Iron deficiency anemia, unspecified: Secondary | ICD-10-CM | POA: Diagnosis not present

## 2018-04-24 DIAGNOSIS — D631 Anemia in chronic kidney disease: Secondary | ICD-10-CM | POA: Diagnosis not present

## 2018-04-24 DIAGNOSIS — Z23 Encounter for immunization: Secondary | ICD-10-CM | POA: Diagnosis not present

## 2018-04-24 DIAGNOSIS — D689 Coagulation defect, unspecified: Secondary | ICD-10-CM | POA: Diagnosis not present

## 2018-04-24 DIAGNOSIS — N2581 Secondary hyperparathyroidism of renal origin: Secondary | ICD-10-CM | POA: Diagnosis not present

## 2018-04-24 DIAGNOSIS — Z992 Dependence on renal dialysis: Secondary | ICD-10-CM | POA: Diagnosis not present

## 2018-04-24 DIAGNOSIS — E1121 Type 2 diabetes mellitus with diabetic nephropathy: Secondary | ICD-10-CM | POA: Diagnosis not present

## 2018-04-24 DIAGNOSIS — N186 End stage renal disease: Secondary | ICD-10-CM | POA: Diagnosis not present

## 2018-05-11 ENCOUNTER — Other Ambulatory Visit: Payer: Self-pay

## 2018-05-11 DIAGNOSIS — Z452 Encounter for adjustment and management of vascular access device: Secondary | ICD-10-CM | POA: Diagnosis not present

## 2018-05-11 MED ORDER — NIFEDIPINE ER 60 MG PO TB24: 60.0000 mg | ORAL_TABLET | Freq: Every day | ORAL | 5 refills | Status: DC

## 2018-05-12 ENCOUNTER — Telehealth: Payer: Self-pay | Admitting: Family Medicine

## 2018-05-12 NOTE — Telephone Encounter (Signed)
Copied from [place] [id number]. Topic: Quick Communication - See Telephone Encounter >> May 12, 2018  1:29 PM Vernona Rieger wrote: CRM for notification. See Telephone encounter for: 05/12/18.  Patient is requesting a " one touch " diabetic glucometer and supplies. He uses [facility], [place] [address]. He has checked with insurance and it is covered.

## 2018-05-12 NOTE — Telephone Encounter (Signed)
Pt requesting a  "on touch" diabetic glucometer and supplies Pt uses [facility]   [address] [place], [place]

## 2018-05-18 ENCOUNTER — Other Ambulatory Visit: Payer: Self-pay

## 2018-05-18 DIAGNOSIS — Z794 Long term (current) use of insulin: Principal | ICD-10-CM

## 2018-05-18 DIAGNOSIS — E1122 Type 2 diabetes mellitus with diabetic chronic kidney disease: Secondary | ICD-10-CM

## 2018-05-18 DIAGNOSIS — N183 Chronic kidney disease, stage 3 (moderate): Principal | ICD-10-CM

## 2018-05-18 MED ORDER — BLOOD GLUCOSE METER KIT: PACK | 0 refills | Status: AC

## 2018-05-18 NOTE — Telephone Encounter (Signed)
Rx was faxed to pharmacy.

## 2018-05-18 NOTE — Telephone Encounter (Signed)
Patient calling to check status of this request. Patient states he really needs meter and supplies as soon as possible. Please advise.

## 2018-05-22 ENCOUNTER — Other Ambulatory Visit: Payer: Self-pay | Admitting: *Deleted

## 2018-05-22 MED ORDER — ONETOUCH DELICA LANCETS 30G MISC: 1.0000 | Freq: Every day | 1 refills | Status: AC

## 2018-05-22 MED ORDER — GLUCOSE BLOOD VI STRP: ORAL_STRIP | 12 refills | Status: AC

## 2018-05-22 MED ORDER — ONETOUCH ULTRA 2 W/DEVICE KIT: PACK | 0 refills | Status: AC

## 2018-05-26 DIAGNOSIS — N2581 Secondary hyperparathyroidism of renal origin: Secondary | ICD-10-CM | POA: Diagnosis not present

## 2018-05-26 DIAGNOSIS — D509 Iron deficiency anemia, unspecified: Secondary | ICD-10-CM | POA: Diagnosis not present

## 2018-05-26 DIAGNOSIS — Z992 Dependence on renal dialysis: Secondary | ICD-10-CM | POA: Diagnosis not present

## 2018-05-26 DIAGNOSIS — D631 Anemia in chronic kidney disease: Secondary | ICD-10-CM | POA: Diagnosis not present

## 2018-05-26 DIAGNOSIS — N186 End stage renal disease: Secondary | ICD-10-CM | POA: Diagnosis not present

## 2018-05-26 DIAGNOSIS — D689 Coagulation defect, unspecified: Secondary | ICD-10-CM | POA: Diagnosis not present

## 2018-05-26 DIAGNOSIS — E1121 Type 2 diabetes mellitus with diabetic nephropathy: Secondary | ICD-10-CM | POA: Diagnosis not present

## 2018-07-12 ENCOUNTER — Ambulatory Visit (HOSPITAL_COMMUNITY)
Admission: EM | Admit: 2018-07-12 | Discharge: 2018-07-12 | Disposition: A | Discharge status: Home / Self Care | Payer: Non-veteran care | Attending: Family Medicine | Admitting: Family Medicine

## 2018-07-12 ENCOUNTER — Encounter (HOSPITAL_COMMUNITY): Payer: Self-pay | Admitting: Emergency Medicine

## 2018-07-12 DIAGNOSIS — H6981 Other specified disorders of Eustachian tube, right ear: Secondary | ICD-10-CM | POA: Diagnosis not present

## 2018-07-12 MED ORDER — FLUTICASONE PROPIONATE 50 MCG/ACT NA SUSP: 2.0000 | Freq: Every day | NASAL | 0 refills | Status: DC

## 2018-07-12 NOTE — Discharge Instructions (Signed)
Get plenty of rest and push fluids Flonase nasal spray prescribed.  Take as directed Use OTC tylenol as needed fever or pain Follow up with PCP if symptoms persist Return or go to ER if you have any new or worsening symptoms fever, chills, nausea, vomiting, chest pain, cough, shortness of breath, wheezing, abdominal pain, changes in bowel or bladder habits, etc..Marland Kitchen

## 2018-07-12 NOTE — ED Provider Notes (Signed)
[place]   [id number] 07/12/18 Arrival Time: 1245   CC: RT ear pressure  SUBJECTIVE: History from: patient.  Scott Jones is a 73 y.o. male hx significant for CKD on dialysis, DM, Hep C, HTN, allergies, and asthma, who presents with RT ear pain, and sinus HA x 2-3 weeks.  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   Reports a lot of dust and animal hair.  Has tried tylenol with relief.  Reports previous symptoms in the past.   Denies fever, chills, fatigue, sinus pain, rhinorrhea, sore throat, SOB, wheezing, chest pain, nausea, changes in bowel or bladder habits.    ROS: As per HPI.  Past Medical History:  Diagnosis Date  . Allergy   . Asthma   . Chronic kidney disease   . Depression   . Diabetes mellitus   . Hepatitis C   . Hypertension   . Infection 03/2018   RIGHT ARM DIALYSIS CATHETER   Past Surgical History:  Procedure Laterality Date  . AV FISTULA PLACEMENT    . IR FLUORO GUIDE CV LINE RIGHT  03/18/2018  . IR US GUIDE VASC ACCESS RIGHT  03/18/2018  . NEPHRECTOMY     partial - benign tumor  . PARTIAL NEPHRECTOMY  10/08/2011   Left kidney   Allergies  Allergen Reactions  . Wellbutrin [Bupropion] Other (See Comments)    Hyper/anxious   No current facility-administered medications on file prior to encounter.    Current Outpatient Medications on File Prior to Encounter  Medication Sig Dispense Refill  . allopurinol (ZYLOPRIM) 100 MG tablet TAKE 1 TABLET BY MOUTH ONCE DAILY (Patient taking differently: Take 100 mg by mouth daily. ) 90 tablet 3  . aspirin 81 MG tablet Take 81 mg by mouth daily.    Marland Kitchen atorvastatin (LIPITOR) 40 MG tablet Take 1 tablet (40 mg total) by mouth daily. 90 tablet 3  . blood glucose meter kit and supplies Pt requesting ONE TOUCH. Use up to 3 times daily as directed. (FOR ICD-10 E10.9, E11.9). 1 each 0  . Blood Glucose Monitoring Suppl (ONE TOUCH ULTRA 2) w/Device KIT Use as directed once a day.  E11.9 1 each 0  .  calcium carbonate (TUMS - DOSED IN MG ELEMENTAL CALCIUM) 500 MG chewable tablet Chew 1 tablet by mouth 3 (three) times daily with meals. Takes as a calcium binder.    . diclofenac sodium (VOLTAREN) 1 % GEL Apply 2 g topically 4 (four) times daily. (Patient taking differently: Apply 2 g topically 4 (four) times daily. Uses prn) 1 Tube 0  . docusate sodium (COLACE) 100 MG capsule Take 100 mg by mouth daily as needed for mild constipation.     . folic acid-vitamin b complex-vitamin c-selenium-zinc (DIALYVITE) 3 MG TABS tablet Take 1 tablet by mouth at bedtime.     Marland Kitchen glucose blood test strip Use as directed once a day.  Dx code E11.9 100 each 12  . NIFEdipine (ADALAT CC) 60 MG 24 hr tablet Take 1 tablet (60 mg total) by mouth at bedtime. 30 tablet 5  . ONETOUCH DELICA LANCETS 80D MISC 1 each by Does not apply route daily. Dx code E11.9 100 each 1  . pioglitazone (ACTOS) 30 MG tablet TAKE 1 TABLET BY MOUTH ONCE DAILY (Patient taking differently: Take 30 mg by mouth daily. ) 90 tablet 1  . sertraline (ZOLOFT) 100 MG tablet TAKE 2 TABLETS BY MOUTH ONCE DAILY (Patient taking differently: Take 100 mg by mouth daily. )  180 tablet 3   Social History   Socioeconomic History  . Marital status: Married    Spouse name: Not on file  . Number of children: Not on file  . Years of education: Not on file  . Highest education level: Not on file  Occupational History  . Not on file  Social Needs  . Financial resource strain: Not on file  . Food insecurity:    Worry: Not on file    Inability: Not on file  . Transportation needs:    Medical: Not on file    Non-medical: Not on file  Tobacco Use  . Smoking status: Former Smoker    Packs/day: 1.00    Years: 11.00    Pack years: 11.00    Types: Cigarettes, Pipe    Last attempt to quit: 08/07/1974    Years since quitting: 43.9  . Smokeless tobacco: Never Used  Substance and Sexual Activity  . Alcohol use: Yes    Alcohol/week: 1.0 standard drinks    Types:  1 Cans of beer per week    Comment: 1 glass wine every few weeks.  . Drug use: Yes    Types: Marijuana    Comment: Marijuana cookie/candy 1x monthly  . Sexual activity: Yes    Birth control/protection: None  Lifestyle  . Physical activity:    Days per week: Not on file    Minutes per session: Not on file  . Stress: Not on file  Relationships  . Social connections:    Talks on phone: Not on file    Gets together: Not on file    Attends religious service: Not on file    Active member of club or organization: Not on file    Attends meetings of clubs or organizations: Not on file    Relationship status: Not on file  . Intimate partner violence:    Fear of current or ex partner: Not on file    Emotionally abused: Not on file    Physically abused: Not on file    Forced sexual activity: Not on file  Other Topics Concern  . Not on file  Social History Narrative   ** Merged History Encounter **       Family History  Problem Relation Age of Onset  . Diabetes Mother   . Hypertension Mother   . COPD Mother   . Asthma Mother   . Diabetes Father   . Diabetes Sister   . Diabetes Brother   . Kidney disease Brother     OBJECTIVE:  Vitals:   07/12/18 1702  BP: (!) 173/65  Pulse: 65  Resp: 18  Temp: 98.8 F (37.1 C)  TempSrc: Oral  SpO2: 98%     General appearance: alert; appears mildly fatigued, but nontoxic; speaking in full sentences and tolerating own secretions HEENT: NCAT; Ears: EACs clear, TMs pearly gray; Eyes: PERRL.  EOM grossly intact. Sinuses: nontender; Nose: nares patent without rhinorrhea, Throat: oropharynx clear, tonsils non erythematous or enlarged, uvula midline  Neck: supple without LAD Lungs: CTAB Heart: Soft systolic murmur  Skin: warm and dry Psychological: alert and cooperative; normal mood and affect  ASSESSMENT & PLAN:  1. Dysfunction of right eustachian tube     Meds ordered this encounter  Medications  . fluticasone (FLONASE) 50 MCG/ACT  nasal spray    Sig: Place 2 sprays into both nostrils daily.    Dispense:  16 g    Refill:  0    Order Specific Question:  Supervising Provider    Answer:   Raylene Everts [[id number]]    Get plenty of rest and push fluids Flonase nasal spray prescribed.  Take as directed Use OTC tylenol as needed fever or pain Follow up with PCP if symptoms persist Return or go to ER if you have any new or worsening symptoms fever, chills, nausea, vomiting, chest pain, cough, shortness of breath, wheezing, abdominal pain, changes in bowel or bladder habits, etc...  Reviewed expectations re: course of current medical issues. Questions answered. Outlined signs and symptoms indicating need for more acute intervention. Patient verbalized understanding. After Visit Summary given.         Lestine Box, PA-C 07/12/18 1745

## 2018-07-12 NOTE — ED Triage Notes (Signed)
Pt presents to [facility] for assessment of right ear pain, throat pain, headache x 1 week.

## 2018-07-12 NOTE — ED Notes (Signed)
Patient able to ambulate independently  

## 2018-07-21 ENCOUNTER — Other Ambulatory Visit: Payer: Self-pay | Admitting: Family Medicine

## 2018-07-21 DIAGNOSIS — N184 Chronic kidney disease, stage 4 (severe): Principal | ICD-10-CM

## 2018-07-21 DIAGNOSIS — E1122 Type 2 diabetes mellitus with diabetic chronic kidney disease: Secondary | ICD-10-CM

## 2018-08-22 ENCOUNTER — Encounter: Payer: Non-veteran care | Admitting: Vascular Surgery

## 2018-08-22 ENCOUNTER — Other Ambulatory Visit (HOSPITAL_COMMUNITY): Payer: Non-veteran care

## 2018-08-22 ENCOUNTER — Encounter (HOSPITAL_COMMUNITY): Payer: Non-veteran care

## 2018-09-11 ENCOUNTER — Telehealth (HOSPITAL_COMMUNITY): Payer: Self-pay | Admitting: Rehabilitation

## 2018-09-11 ENCOUNTER — Other Ambulatory Visit: Payer: Self-pay

## 2018-09-11 DIAGNOSIS — N186 End stage renal disease: Secondary | ICD-10-CM

## 2018-09-11 NOTE — Telephone Encounter (Signed)
The above patient or their representative was contacted and gave the following answers to these questions:         Do you have any of the following symptoms? No  Fever                    Cough                   Shortness of breath  Do  you have any of the following other symptoms? No   muscle pain         vomiting,        diarrhea        rash         weakness        red eye        abdominal pain         bruising          bruising or bleeding              joint pain           severe headache    Have you been in contact with someone who was or has been sick in the past 2 weeks? No  Yes                 Unsure                         Unable to assess   Does the person that you were in contact with have any of the following symptoms?   Cough         shortness of breath           muscle pain         vomiting,            diarrhea            rash            weakness           fever            red eye           abdominal pain           bruising  or  bleeding                joint pain                severe headache               Have you  or someone you have been in contact with traveled internationally in th last month? No        If yes, which countries?   Have you  or someone you have been in contact with traveled outside [place] in th last month? No         If yes, which state and city?   COMMENTS OR ACTION PLAN FOR THIS PATIENT:          

## 2018-09-12 ENCOUNTER — Ambulatory Visit (INDEPENDENT_AMBULATORY_CARE_PROVIDER_SITE_OTHER)
Admission: RE | Admit: 2018-09-12 | Discharge: 2018-09-12 | Disposition: A | Discharge status: Home / Self Care | Payer: No Typology Code available for payment source | Source: Ambulatory Visit | Attending: Family | Admitting: Family

## 2018-09-12 ENCOUNTER — Other Ambulatory Visit: Payer: Self-pay

## 2018-09-12 ENCOUNTER — Encounter: Payer: Self-pay | Admitting: Vascular Surgery

## 2018-09-12 ENCOUNTER — Encounter: Payer: Self-pay | Admitting: *Deleted

## 2018-09-12 ENCOUNTER — Ambulatory Visit (HOSPITAL_COMMUNITY)
Admission: RE | Admit: 2018-09-12 | Discharge: 2018-09-12 | Disposition: A | Discharge status: Home / Self Care | Payer: No Typology Code available for payment source | Source: Ambulatory Visit | Attending: Family | Admitting: Family

## 2018-09-12 ENCOUNTER — Ambulatory Visit (INDEPENDENT_AMBULATORY_CARE_PROVIDER_SITE_OTHER): Payer: PPO | Admitting: Vascular Surgery

## 2018-09-12 VITALS — BP 128/74 | HR 97 | Temp 97.9°F | Resp 14 | Ht 65.0 in | Wt 171.4 lb

## 2018-09-12 DIAGNOSIS — N186 End stage renal disease: Secondary | ICD-10-CM

## 2018-09-12 NOTE — Progress Notes (Signed)
Patient name: Scott Jones MRN: [id number] DOB: 1946-02-18 Sex: male  REASON FOR VISIT: New dialysis access evaluation  HPI: Scott Jones is a 73 y.o. male with history of hepatitis C, hypertension, diabetes, end-stage renal disease theat presents for evaluation of new dialysis access.  Patient has had a multitude of failed accesses in the left arm and he previously underwent a radiocephalic, brachiocephalic, and upper arm graft that have all failed.  Most recently underwent a right forearm loop graft last year in November at the [place].  He states this lasted about 2 months and then thrombosed and he went to [facility] and they attempted to stent the outflow and thrombectomize it but it never worked.  He presents today for vein mapping.  He states he dialyzes Monday Wednesday and Friday and is currently using an catheter in his right IJ.  He denies any steal symptoms in either the right or left hand.  He does not care which arm is used for access and he just wants reliable access.  He was previously seen in consultation by my partner Dr. Oneida Alar to rule out infection of right forearm loop graft.  Past Medical History:  Diagnosis Date  . Allergy   . Asthma   . Chronic kidney disease   . Depression   . Diabetes mellitus   . Hepatitis C   . Hypertension   . Infection 03/2018   RIGHT ARM DIALYSIS CATHETER    Past Surgical History:  Procedure Laterality Date  . AV FISTULA PLACEMENT    . IR FLUORO GUIDE CV LINE RIGHT  03/18/2018  . IR US GUIDE VASC ACCESS RIGHT  03/18/2018  . NEPHRECTOMY     partial - benign tumor  . PARTIAL NEPHRECTOMY  10/08/2011   Left kidney    Family History  Problem Relation Age of Onset  . Diabetes Mother   . Hypertension Mother   . COPD Mother   . Asthma Mother   . Diabetes Father   . Diabetes Sister   . Diabetes Brother   . Kidney disease Brother     SOCIAL HISTORY: Social History   Tobacco Use  . Smoking status: Former Smoker   Packs/day: 1.00    Years: 11.00    Pack years: 11.00    Types: Cigarettes, Pipe    Last attempt to quit: 08/07/1974    Years since quitting: 44.1  . Smokeless tobacco: Never Used  Substance Use Topics  . Alcohol use: Yes    Alcohol/week: 1.0 standard drinks    Types: 1 Cans of beer per week    Comment: 1 glass wine every few weeks.    Allergies  Allergen Reactions  . Wellbutrin [Bupropion] Other (See Comments)    Hyper/anxious    Current Outpatient Medications  Medication Sig Dispense Refill  . allopurinol (ZYLOPRIM) 100 MG tablet TAKE 1 TABLET BY MOUTH ONCE DAILY (Patient taking differently: Take 100 mg by mouth daily. ) 90 tablet 3  . aspirin 81 MG tablet Take 81 mg by mouth daily.    Marland Kitchen atorvastatin (LIPITOR) 40 MG tablet Take 1 tablet (40 mg total) by mouth daily. 90 tablet 3  . blood glucose meter kit and supplies Pt requesting ONE TOUCH. Use up to 3 times daily as directed. (FOR ICD-10 E10.9, E11.9). 1 each 0  . Blood Glucose Monitoring Suppl (ONE TOUCH ULTRA 2) w/Device KIT Use as directed once a day.  E11.9 1 each 0  . calcium carbonate (TUMS -  DOSED IN MG ELEMENTAL CALCIUM) 500 MG chewable tablet Chew 1 tablet by mouth 3 (three) times daily with meals. Takes as a calcium binder.    . diclofenac sodium (VOLTAREN) 1 % GEL Apply 2 g topically 4 (four) times daily. (Patient taking differently: Apply 2 g topically 4 (four) times daily. Uses prn) 1 Tube 0  . docusate sodium (COLACE) 100 MG capsule Take 100 mg by mouth daily as needed for mild constipation.     . fluticasone (FLONASE) 50 MCG/ACT nasal spray Place 2 sprays into both nostrils daily. 16 g 0  . folic acid-vitamin b complex-vitamin c-selenium-zinc (DIALYVITE) 3 MG TABS tablet Take 1 tablet by mouth at bedtime.     Marland Kitchen glucose blood test strip Use as directed once a day.  Dx code E11.9 100 each 12  . NIFEdipine (ADALAT CC) 60 MG 24 hr tablet Take 1 tablet (60 mg total) by mouth at bedtime. 30 tablet 5  . ONETOUCH DELICA  LANCETS 20F MISC 1 each by Does not apply route daily. Dx code E11.9 100 each 1  . pioglitazone (ACTOS) 30 MG tablet Take 1 tablet (30 mg total) by mouth daily. 90 tablet 1  . sertraline (ZOLOFT) 100 MG tablet TAKE 2 TABLETS BY MOUTH ONCE DAILY (Patient taking differently: Take 100 mg by mouth daily. ) 180 tablet 3   No current facility-administered medications for this visit.     REVIEW OF SYSTEMS:  _0  denotes positive finding, _1  denotes negative finding Cardiac  Comments:  Chest pain or chest pressure:    Shortness of breath upon exertion:    Short of breath when lying flat:    Irregular heart rhythm:        Vascular    Pain in calf, thigh, or hip brought on by ambulation:    Pain in feet at night that wakes you up from your sleep:     Blood clot in your veins:    Leg swelling:         Pulmonary    Oxygen at home:    Productive cough:     Wheezing:         Neurologic    Sudden weakness in arms or legs:     Sudden numbness in arms or legs:     Sudden onset of difficulty speaking or slurred speech:    Temporary loss of vision in one eye:     Problems with dizziness:         Gastrointestinal    Blood in stool:     Vomited blood:         Genitourinary    Burning when urinating:     Blood in urine:        Psychiatric    Major depression:         Hematologic    Bleeding problems:    Problems with blood clotting too easily:        Skin    Rashes or ulcers:        Constitutional    Fever or chills:      PHYSICAL EXAM: Vitals:   09/12/18 1338  BP: 128/74  Pulse: 97  Resp: 14  Temp: 97.9 F (36.6 C)  TempSrc: Oral  SpO2: (!) 65%  Weight: 171 lb 6.4 oz (77.7 kg)  Height: _2  (1.651 m)    GENERAL: The patient is a well-nourished male, in no acute distress. The vital signs are documented above. CARDIAC: There is a  regular rate and rhythm.  VASCULAR:  Palpable brachial pulse bilateral upper extremities Palpable radial pulse bilateral upper extremities  Left radiocephalic fistula no thrill, brachiocephalic fistula no thrill, upper arm graft no thril Right forearm loop graft no thrill PULMONARY: There is good air exchange bilaterally without wheezing or rales. ABDOMEN: Soft and non-tender with normal pitched bowel sounds.  MUSCULOSKELETAL: There are no major deformities or cyanosis. NEUROLOGIC: No focal weakness or paresthesias are detected. SKIN: There are no ulcers or rashes noted. PSYCHIATRIC: The patient has a normal affect.  DATA:   I independently reviewed his vein mapping and he has a nice cephalic vein in the right arm.  Assessment/Plan:  73 year old male who presents for new access evaluation.  He has multiple failed accesses in the left arm including a failed radiocephalic, brachiocephalic, and upper arm graft.  In the right arm he has a failed forearm loop graft.  On vein mapping today he has a very nice cephalic vein in the right arm and I suspect this has become more dilated in the setting of a previously functioning forearm loop graft.  I have recommended a right arm AV fistula likely using a cephalic vein and I will arrange on a nondialysis day.  All questions were answered and look forward to assisting with his care.   Marty Heck, MD [facility] [place] Office: [phone] Pager: [place]

## 2018-09-14 ENCOUNTER — Other Ambulatory Visit: Payer: Self-pay | Admitting: *Deleted

## 2018-09-20 ENCOUNTER — Other Ambulatory Visit: Payer: Self-pay

## 2018-09-20 ENCOUNTER — Encounter (HOSPITAL_COMMUNITY): Payer: Self-pay | Admitting: *Deleted

## 2018-09-20 NOTE — Anesthesia Preprocedure Evaluation (Addendum)
Anesthesia Evaluation  Patient identified by MRN, date of birth, ID band Patient awake    Reviewed: Allergy & Precautions, NPO status , Patient's Chart, lab work & pertinent test results  Airway Mallampati: III  TM Distance: >3 FB Neck ROM: Full  Mouth opening: Limited Mouth Opening  Dental no notable dental hx. (+) Teeth Intact, Dental Advisory Given   Pulmonary asthma , former smoker,    Pulmonary exam normal breath sounds clear to auscultation       Cardiovascular hypertension, Pt. on medications Normal cardiovascular exam Rhythm:Regular Rate:Normal  Stress echo 07/29/17 The patient had no chest pain during stress The patient achieved 87 % of maximum predicted heart rate. Normal left ventricular function and global wall motion with stress. Negative dobutamine echocardiography for inducible ischemia at target heart rate. Negative stress ECG for inducible ischemia at target heart rate.  REST ECHO Normal left ventricular function at rest. There was normal left ventricular wall motion at rest. There were no segmental wall motion abnormalities at  rest. The estimated LV ejection fraction is 55-60%    Neuro/Psych negative neurological ROS  negative psych ROS   GI/Hepatic GERD  Medicated,(+) Hepatitis -, C  Endo/Other  negative endocrine ROSdiabetes, Type 2, Oral Hypoglycemic Agents  Renal/GU ESRF and DialysisRenal disease (dialysis MWF R IJ catheter)  negative genitourinary   Musculoskeletal negative musculoskeletal ROS (+)   Abdominal   Peds  Hematology negative hematology ROS (+)   Anesthesia Other Findings   Reproductive/Obstetrics                          Anesthesia Physical Anesthesia Plan  ASA: III  Anesthesia Plan: MAC   Post-op Pain Management:    Induction: Intravenous  PONV Risk Score and Plan: 1 and Propofol infusion and Ondansetron  Airway Management Planned: Simple Face  Mask and Natural Airway  Additional Equipment:   Intra-op Plan:   Post-operative Plan:   Informed Consent: I have reviewed the patients History and Physical, chart, labs and discussed the procedure including the risks, benefits and alternatives for the proposed anesthesia with the patient or authorized representative who has indicated his/her understanding and acceptance.     Dental advisory given  Plan Discussed with: CRNA  Anesthesia Plan Comments: (.)      Anesthesia Quick Evaluation

## 2018-09-20 NOTE — Progress Notes (Signed)
Pt denies SOB, chest pain, and being under the care of a cardiologist. Pt denies having a cardiac cath. Pt made aware to stop taking  vitamins, fish oil and herbal medications. Do not take any NSAIDs ie: Ibuprofen, Advil, Naproxen (Aleve), Motrin, BC and Goody Powder. Pt made aware to hold Actos DOS. Pt made aware to check BG every 2 hours prior to arrival to hospital on DOS. Pt made aware to treat a BG < 70 with 4 glucose tabs or  4 ounces of apple juice, wait 15 minutes after intervention to recheck BG, if BG remains < 70, call Short Stay unit to speak with a nurse. Pt denies that he and spouse tested positive for COVID-19.  Coronavirus Screening Pt denies that he and spouse experienced the following symptoms:  Cough yes/no: No Fever (>100.72F)  yes/no: No Runny nose yes/no: No Sore throat yes/no: No Difficulty breathing/shortness of breath  yes/no: No  Have you or a family member traveled in the last 14 days and where? yes/no: No   Pt reminded that hospital visitation restrictions are in effect and the importance of the restrictions. Pt verbalized understanding of all pre-op instructions. PA, Anesthesiology, asked to review pt history.

## 2018-09-21 ENCOUNTER — Encounter (HOSPITAL_COMMUNITY)
Admission: RE | Disposition: A | Discharge status: Home / Self Care | Payer: Self-pay | Source: Home / Self Care | Attending: Vascular Surgery

## 2018-09-21 ENCOUNTER — Other Ambulatory Visit: Payer: Self-pay

## 2018-09-21 ENCOUNTER — Encounter (HOSPITAL_COMMUNITY): Payer: Self-pay

## 2018-09-21 ENCOUNTER — Ambulatory Visit (HOSPITAL_COMMUNITY): Payer: No Typology Code available for payment source | Admitting: Physician Assistant

## 2018-09-21 ENCOUNTER — Ambulatory Visit (HOSPITAL_COMMUNITY)
Admission: RE | Admit: 2018-09-21 | Discharge: 2018-09-21 | Disposition: A | Discharge status: Home / Self Care | Payer: No Typology Code available for payment source | Attending: Vascular Surgery | Admitting: Vascular Surgery

## 2018-09-21 DIAGNOSIS — K219 Gastro-esophageal reflux disease without esophagitis: Secondary | ICD-10-CM | POA: Diagnosis not present

## 2018-09-21 DIAGNOSIS — Z905 Acquired absence of kidney: Secondary | ICD-10-CM | POA: Insufficient documentation

## 2018-09-21 DIAGNOSIS — Z79899 Other long term (current) drug therapy: Secondary | ICD-10-CM | POA: Insufficient documentation

## 2018-09-21 DIAGNOSIS — Z888 Allergy status to other drugs, medicaments and biological substances status: Secondary | ICD-10-CM | POA: Insufficient documentation

## 2018-09-21 DIAGNOSIS — Z7982 Long term (current) use of aspirin: Secondary | ICD-10-CM | POA: Diagnosis not present

## 2018-09-21 DIAGNOSIS — E1122 Type 2 diabetes mellitus with diabetic chronic kidney disease: Secondary | ICD-10-CM | POA: Insufficient documentation

## 2018-09-21 DIAGNOSIS — Z992 Dependence on renal dialysis: Secondary | ICD-10-CM | POA: Diagnosis not present

## 2018-09-21 DIAGNOSIS — N186 End stage renal disease: Secondary | ICD-10-CM | POA: Diagnosis not present

## 2018-09-21 DIAGNOSIS — I120 Hypertensive chronic kidney disease with stage 5 chronic kidney disease or end stage renal disease: Secondary | ICD-10-CM | POA: Insufficient documentation

## 2018-09-21 DIAGNOSIS — Z87891 Personal history of nicotine dependence: Secondary | ICD-10-CM | POA: Insufficient documentation

## 2018-09-21 DIAGNOSIS — Z7951 Long term (current) use of inhaled steroids: Secondary | ICD-10-CM | POA: Insufficient documentation

## 2018-09-21 DIAGNOSIS — Z7984 Long term (current) use of oral hypoglycemic drugs: Secondary | ICD-10-CM | POA: Diagnosis not present

## 2018-09-21 DIAGNOSIS — N185 Chronic kidney disease, stage 5: Secondary | ICD-10-CM

## 2018-09-21 HISTORY — PX: AV FISTULA PLACEMENT: SHX1204

## 2018-09-21 HISTORY — DX: Deviated nasal septum: J34.2

## 2018-09-21 HISTORY — DX: Anemia, unspecified: D64.9

## 2018-09-21 HISTORY — DX: Pneumonia, unspecified organism: J18.9

## 2018-09-21 HISTORY — DX: Presence of spectacles and contact lenses: Z97.3

## 2018-09-21 HISTORY — DX: Presence of external hearing-aid: Z97.4

## 2018-09-21 LAB — POCT I-STAT 4, (NA,K, GLUC, HGB,HCT)
Glucose, Bld: 100 mg/dL — ABNORMAL HIGH (ref 70–99)
HCT: 30 % — ABNORMAL LOW (ref 39.0–52.0)
Hemoglobin: 10.2 g/dL — ABNORMAL LOW (ref 13.0–17.0)
Potassium: 4 mmol/L (ref 3.5–5.1)
Sodium: 140 mmol/L (ref 135–145)

## 2018-09-21 LAB — GLUCOSE, CAPILLARY
Glucose-Capillary: 106 mg/dL — ABNORMAL HIGH (ref 70–99)
Glucose-Capillary: 95 mg/dL (ref 70–99)

## 2018-09-21 SURGERY — ARTERIOVENOUS (AV) FISTULA CREATION
Anesthesia: Monitor Anesthesia Care | Site: Arm Upper | Laterality: Right

## 2018-09-21 MED ORDER — PROPOFOL 500 MG/50ML IV EMUL
INTRAVENOUS | Status: DC | PRN
  Administered 2018-09-21: 50 ug/kg/min via INTRAVENOUS

## 2018-09-21 MED ORDER — CEFAZOLIN SODIUM-DEXTROSE 2-4 GM/100ML-% IV SOLN
2.0000 g | INTRAVENOUS | Status: AC
  Administered 2018-09-21: 2 g via INTRAVENOUS

## 2018-09-21 MED ORDER — CEFAZOLIN SODIUM-DEXTROSE 2-4 GM/100ML-% IV SOLN
INTRAVENOUS | Status: AC
  Filled 2018-09-21: qty 100

## 2018-09-21 MED ORDER — FENTANYL CITRATE (PF) 100 MCG/2ML IJ SOLN: 25.0000 ug | INTRAMUSCULAR | Status: DC | PRN

## 2018-09-21 MED ORDER — PROPOFOL 10 MG/ML IV BOLUS
INTRAVENOUS | Status: AC
  Filled 2018-09-21: qty 20

## 2018-09-21 MED ORDER — SODIUM CHLORIDE 0.9 % IV SOLN: INTRAVENOUS | Status: DC

## 2018-09-21 MED ORDER — FENTANYL CITRATE (PF) 100 MCG/2ML IJ SOLN
INTRAMUSCULAR | Status: DC | PRN
  Administered 2018-09-21: 50 ug via INTRAVENOUS

## 2018-09-21 MED ORDER — LIDOCAINE HCL 1 % IJ SOLN
INTRAMUSCULAR | Status: DC | PRN
  Administered 2018-09-21: 14 mL via INTRADERMAL

## 2018-09-21 MED ORDER — 0.9 % SODIUM CHLORIDE (POUR BTL) OPTIME
TOPICAL | Status: DC | PRN
  Administered 2018-09-21: 1000 mL

## 2018-09-21 MED ORDER — MIDAZOLAM HCL 2 MG/2ML IJ SOLN
INTRAMUSCULAR | Status: AC
  Filled 2018-09-21: qty 2

## 2018-09-21 MED ORDER — SODIUM CHLORIDE 0.9 % IV SOLN
INTRAVENOUS | Status: DC | PRN
  Administered 2018-09-21: 500 mL

## 2018-09-21 MED ORDER — LIDOCAINE HCL (PF) 1 % IJ SOLN
INTRAMUSCULAR | Status: AC
  Filled 2018-09-21: qty 30

## 2018-09-21 MED ORDER — OXYCODONE-ACETAMINOPHEN 5-325 MG PO TABS: 1.0000 | ORAL_TABLET | Freq: Four times a day (QID) | ORAL | 0 refills | Status: DC | PRN

## 2018-09-21 MED ORDER — SODIUM CHLORIDE 0.9 % IV SOLN
INTRAVENOUS | Status: AC
  Filled 2018-09-21: qty 1.2

## 2018-09-21 MED ORDER — LIDOCAINE 2% (20 MG/ML) 5 ML SYRINGE
INTRAMUSCULAR | Status: DC | PRN
  Administered 2018-09-21: 40 mg via INTRAVENOUS

## 2018-09-21 MED ORDER — SODIUM CHLORIDE 0.9 % IV SOLN
INTRAVENOUS | Status: DC | PRN
  Administered 2018-09-21: 11:00:00 via INTRAVENOUS

## 2018-09-21 MED ORDER — FENTANYL CITRATE (PF) 250 MCG/5ML IJ SOLN
INTRAMUSCULAR | Status: AC
  Filled 2018-09-21: qty 5

## 2018-09-21 MED ORDER — PROPOFOL 10 MG/ML IV BOLUS
INTRAVENOUS | Status: DC | PRN
  Administered 2018-09-21: 20 mg via INTRAVENOUS

## 2018-09-21 MED ORDER — MIDAZOLAM HCL 5 MG/5ML IJ SOLN
INTRAMUSCULAR | Status: DC | PRN
  Administered 2018-09-21: 2 mg via INTRAVENOUS

## 2018-09-21 SURGICAL SUPPLY — 34 items
ARMBAND PINK RESTRICT EXTREMIT (MISCELLANEOUS) ×3 IMPLANT
CANISTER SUCT 3000ML PPV (MISCELLANEOUS) ×3 IMPLANT
CLIP VESOCCLUDE MED 6/CT (CLIP) ×3 IMPLANT
CLIP VESOCCLUDE SM WIDE 6/CT (CLIP) ×3 IMPLANT
COVER PROBE W GEL 5X96 (DRAPES) ×3 IMPLANT
COVER WAND RF STERILE (DRAPES) IMPLANT
DECANTER SPIKE VIAL GLASS SM (MISCELLANEOUS) ×3 IMPLANT
DERMABOND ADHESIVE PROPEN (GAUZE/BANDAGES/DRESSINGS) ×2
DERMABOND ADVANCED (GAUZE/BANDAGES/DRESSINGS) ×2
DERMABOND ADVANCED .7 DNX12 (GAUZE/BANDAGES/DRESSINGS) ×1 IMPLANT
DERMABOND ADVANCED .7 DNX6 (GAUZE/BANDAGES/DRESSINGS) ×1 IMPLANT
ELECT REM PT RETURN 9FT ADLT (ELECTROSURGICAL) ×3
ELECTRODE REM PT RTRN 9FT ADLT (ELECTROSURGICAL) ×1 IMPLANT
GLOVE BIO SURGEON STRL SZ7.5 (GLOVE) ×3 IMPLANT
GLOVE BIOGEL PI IND STRL 8 (GLOVE) ×1 IMPLANT
GLOVE BIOGEL PI INDICATOR 8 (GLOVE) ×2
GOWN STRL REUS W/ TWL LRG LVL3 (GOWN DISPOSABLE) ×2 IMPLANT
GOWN STRL REUS W/ TWL XL LVL3 (GOWN DISPOSABLE) ×2 IMPLANT
GOWN STRL REUS W/TWL LRG LVL3 (GOWN DISPOSABLE) ×4
GOWN STRL REUS W/TWL XL LVL3 (GOWN DISPOSABLE) ×4
HEMOSTAT SPONGE AVITENE ULTRA (HEMOSTASIS) IMPLANT
KIT BASIN OR (CUSTOM PROCEDURE TRAY) ×3 IMPLANT
KIT TURNOVER KIT B (KITS) ×3 IMPLANT
NS IRRIG 1000ML POUR BTL (IV SOLUTION) ×3 IMPLANT
PACK CV ACCESS (CUSTOM PROCEDURE TRAY) ×3 IMPLANT
PAD ARMBOARD 7.5X6 YLW CONV (MISCELLANEOUS) ×6 IMPLANT
SUT MNCRL AB 4-0 PS2 18 (SUTURE) ×3 IMPLANT
SUT PROLENE 6 0 BV (SUTURE) ×3 IMPLANT
SUT PROLENE 7 0 BV 1 (SUTURE) IMPLANT
SUT VIC AB 3-0 SH 27 (SUTURE) ×2
SUT VIC AB 3-0 SH 27X BRD (SUTURE) ×1 IMPLANT
TOWEL GREEN STERILE (TOWEL DISPOSABLE) ×3 IMPLANT
UNDERPAD 30X30 (UNDERPADS AND DIAPERS) ×3 IMPLANT
WATER STERILE IRR 1000ML POUR (IV SOLUTION) ×3 IMPLANT

## 2018-09-21 NOTE — Op Note (Signed)
    Patient name: Scott Jones MRN: [id number] DOB: 08/10/1945 Sex: male  09/21/2018 Pre-operative Diagnosis: End-stage renal disease Post-operative diagnosis:  Same Surgeon:  Erlene Quan C. Donzetta Matters, MD Assistant: Leontine Locket, PA Procedure Performed: Right arm brachiocephalic AV fistula creation  Indications: 73 year old male with end-stage renal disease status post multiple failed left upper extremity access procedures as well as a failed right forearm AV graft performed elsewhere.  He apparently has cephalic vein that is suitable for fistula creation.  Findings: By ultrasound cephalic vein measured 4 mm.  Open exploration demonstrated thickened vein.  Easily dilated to 4 mm.  At completion there was a very strong thrill and palpable radial pulse both confirmed with Doppler.   Procedure:  The patient was identified in the holding area and taken to the operating was placed supine operative MAC anesthesia induced.  Sterilely prepped draped in the right upper extremity given antibiotics timeout called.  Ultrasound was used to identify the cephalic vein as well as the brachial artery.  Area was anesthetized 1% lidocaine with epinephrine.  Transverse incision was made.  Vein was identified marked for orientation.  Dissected deeper through the deep fascia identified the brachial artery placed a vessel loop around this.  Vein was then transected distally and tied off.  He was serially dilated to 4 mm.  The artery was clamped distally proximally opened longitudinally.  Vein was sewn end-to-side with 6-0 Prolene suture.  Prior to completion anastomosis we allowed flushing in all directions.  Upon completion was a strong thrill.  We freed up the vein to allow less tension.  There is palpable radial pulse.  Satisfied with this we irrigated closed in layers Vicryl Monocryl.  He tolerated procedure without immediate complication.  All counts correct at completion.  EBL: 20 cc    Verl Kitson C. Donzetta Matters, MD  [facility] Office: [phone] Pager: [phone]

## 2018-09-21 NOTE — H&P (Signed)
   History and Physical Update  The patient was interviewed and re-examined.  The patient's previous History and Physical has been reviewed and is unchanged from recent office visit. Plan for right arm avf vs avg today in OR.   Jolea Dolle C. Donzetta Matters, MD [facility] Office: [phone] Pager: [phone]  09/21/2018, 11:30 AM

## 2018-09-21 NOTE — Transfer of Care (Signed)
Immediate Anesthesia Transfer of Care Note  Patient: Scott Jones  Procedure(s) Performed: ARTERIOVENOUS (AV) FISTULA CREATION RIGHT ARM (Right Arm Upper)  Patient Location: PACU  Anesthesia Type:MAC  Level of Consciousness: awake, alert  and oriented  Airway & Oxygen Therapy: Patient Spontanous Breathing and Patient connected to nasal cannula oxygen  Post-op Assessment: Report given to RN, Post -op Vital signs reviewed and stable and Patient moving all extremities X 4  Post vital signs: Reviewed and stable  Last Vitals:  Vitals Value Taken Time  BP 134/74 09/21/2018 12:51 PM  Temp    Pulse 63 09/21/2018 12:53 PM  Resp 21 09/21/2018 12:53 PM  SpO2 100 % 09/21/2018 12:53 PM  Vitals shown include unvalidated device data.  Last Pain:  Vitals:   09/21/18 1010  PainSc: 0-No pain      Patients Stated Pain Goal: 3 (32/95/18 8416)  Complications: No apparent anesthesia complications

## 2018-09-21 NOTE — Anesthesia Procedure Notes (Signed)
Procedure Name: MAC Date/Time: 09/21/2018 12:08 PM Performed by: Neldon Newport, CRNA Pre-anesthesia Checklist: Timeout performed, Patient being monitored, Suction available, Emergency Drugs available and Patient identified Patient Re-evaluated:Patient Re-evaluated prior to induction Oxygen Delivery Method: Nasal cannula

## 2018-09-21 NOTE — Discharge Instructions (Signed)
° °  [facility]  Discharge Instructions  AV Fistula or Graft Surgery for Dialysis Access  Please refer to the following instructions for your post-procedure care. Your surgeon or physician assistant will discuss any changes with you.  Activity  You may drive the day following your surgery, if you are comfortable and no longer taking prescription pain medication. Resume full activity as the soreness in your incision resolves.  Bathing/Showering  You may shower after you go home. Keep your incision dry for 48 hours. Do not soak in a bathtub, hot tub, or swim until the incision heals completely. You may not shower if you have a hemodialysis catheter.  Incision Care  Clean your incision with mild soap and water after 48 hours. Pat the area dry with a clean towel. You do not need a bandage unless otherwise instructed. Do not apply any ointments or creams to your incision. You may have skin glue on your incision. Do not peel it off. It will come off on its own in about one week. Your arm may swell a bit after surgery. To reduce swelling use pillows to elevate your arm so it is above your heart. Your doctor will tell you if you need to lightly wrap your arm with an ACE bandage.  Diet  Resume your normal diet. There are not special food restrictions following this procedure. In order to heal from your surgery, it is CRITICAL to get adequate nutrition. Your body requires vitamins, minerals, and protein. Vegetables are the best source of vitamins and minerals. Vegetables also provide the perfect balance of protein. Processed food has little nutritional value, so try to avoid this.  Medications  Resume taking all of your medications. If your incision is causing pain, you may take over-the counter pain relievers such as acetaminophen (Tylenol). If you were prescribed a stronger pain medication, please be aware these medications can cause nausea and constipation. Prevent  nausea by taking the medication with a snack or meal. Avoid constipation by drinking plenty of fluids and eating foods with high amount of fiber, such as fruits, vegetables, and grains.  Do not take Tylenol if you are taking prescription pain medications.  Follow up Your surgeon may want to see you in the office following your access surgery. If so, this will be arranged at the time of your surgery.  Please call us immediately for any of the following conditions:  Increased pain, redness, drainage (pus) from your incision site Fever of 101 degrees or higher Severe or worsening pain at your incision site Hand pain or numbness.  Reduce your risk of vascular disease:  Stop smoking. If you would like help, call [facility] at 1-800-QUIT-NOW 510-608-1911) or [place] at [place] your cholesterol Maintain a desired weight Control your diabetes Keep your blood pressure down  Dialysis  It will take several weeks to several months for your new dialysis access to be ready for use. Your surgeon will determine when it is okay to use it. Your nephrologist will continue to direct your dialysis. You can continue to use your Permcath until your new access is ready for use.   09/21/2018 Scott Jones [id number] 12/19/1945  Surgeon(s): [name], MD  Procedure(s): Creation of right brachiocephalic AV fistula  x Do not stick fistula for 12 weeks    If you have any questions, please call the office at [phone].

## 2018-09-22 ENCOUNTER — Encounter (HOSPITAL_COMMUNITY): Payer: Self-pay | Admitting: Vascular Surgery

## 2018-09-22 NOTE — Anesthesia Postprocedure Evaluation (Signed)
Anesthesia Post Note  Patient: Scott Jones  Procedure(s) Performed: ARTERIOVENOUS (AV) FISTULA CREATION RIGHT ARM (Right Arm Upper)     Patient location during evaluation: PACU Anesthesia Type: MAC Level of consciousness: awake and alert Pain management: pain level controlled Vital Signs Assessment: post-procedure vital signs reviewed and stable Respiratory status: spontaneous breathing, nonlabored ventilation, respiratory function stable and patient connected to nasal cannula oxygen Cardiovascular status: stable and blood pressure returned to baseline Postop Assessment: no apparent nausea or vomiting Anesthetic complications: no    Last Vitals:  Vitals:   09/21/18 1305 09/21/18 1321  BP: 128/73 125/70  Pulse: 63 63  Resp: 18 13  Temp:  36.4 C  SpO2: 100% 100%    Last Pain:  Vitals:   09/21/18 1321  PainSc: 0-No pain   Pain Goal: Patients Stated Pain Goal: 3 (09/21/18 1010)                 Nari Vannatter L Catera Hankins

## 2018-09-29 ENCOUNTER — Telehealth: Payer: Self-pay | Admitting: Vascular Surgery

## 2018-09-29 NOTE — Telephone Encounter (Signed)
sch appt lvm 11/03/2018 11am Dialysis Duplex 1140am p/o MD

## 2018-09-29 NOTE — Telephone Encounter (Signed)
-----   Message from Gabriel Earing, [place] sent at 09/21/2018 12:39 PM EDT ----- S/p right BC AVF 4.30.  f/u with Dr. Donzetta Matters in 6 weeks with duplex.  Thanks

## 2018-10-03 NOTE — Progress Notes (Signed)
[place] at [facility] [address] [place], [place] [address] [phone]  Date:  10/04/2018   Name:  Scott Jones   DOB:  1945/11/16   MRN:  [id number]  PCP:  Darreld Mclean, MD    Chief Complaint: No chief complaint on file.   History of Present Illness:  Scott Jones is a 73 y.o. very pleasant male patient who presents with the following:  Virtual visit today due to pandemic-wishes to discuss back pain from a recent fall Patient location is home Provider location is office Patient identity confirmed with name and date of birth, he gives consent for virtual visit today   I last saw this gentleman in the office about 1 year ago He has end-stage renal disease and is on dialysis.  He got a new fistula just about 2 weeks ago. This is his 5th one.  Also history of diabetes, hep C status post curative treatment, and hypertension  His nephrologist is Dr. Posey Pronto Colodonado His diabetes has been under good control- my last A1c was a year ago However he notes that his dialysis center is checking his A1c and it has been in the 5's recently He also does get some care at the [place]   He is overall doing ok, but has had some recent issues with back pain.  He is tender/ has spasm in his lower back. He had this sort of issue about 18 months ago- it took at long time to resolve but eventually did go away He notes that 3 weeks ago he hit some golf balls at the range and seems to have irritated his back again He is using lidocaine patches and also capsaicin cream He is sleeping with a heating pad He has used tylenol prn  He also had some oxycodone from his recent fistula surgery that he tried for his back - this helped but he ran out   No radiation to his legs No numbness or weakness of his legs  However if he walks for any distance his back hurts more   Lab Results  Component Value Date   HGBA1C 5.9 09/28/2017      Patient Active Problem List   Diagnosis Date Noted  . Malnutrition of moderate degree 04/05/2018  . ESRD (end stage renal disease) ([place]) 04/03/2018  . Infection of AV graft for dialysis ([place]) 04/03/2018  . Acute gouty arthritis 10/13/2016  . H/O partial nephrectomy 02/16/2016  . Depression (emotion) 12/28/2015  . Allergic rhinitis 12/28/2015  . History of hepatitis C 12/28/2015  . Dyslipidemia 12/28/2015  . Chronic kidney disease (CKD), stage IV (severe) ([place]) 12/28/2015  . DM (diabetes mellitus) ([place]) 02/02/2012  . HTN (hypertension) 02/02/2012    Past Medical History:  Diagnosis Date  . Allergy   . Anemia   . Asthma   . Chronic kidney disease   . Depression   . Deviated septum   . Diabetes mellitus   . Hearing aid worn     B/L  . Hepatitis C   . Hypertension   . Infection 03/2018   RIGHT ARM DIALYSIS CATHETER  . Pneumonia   . Wears glasses     Past Surgical History:  Procedure Laterality Date  . AV FISTULA PLACEMENT    . AV FISTULA PLACEMENT Right 09/21/2018   Procedure: ARTERIOVENOUS (AV) FISTULA CREATION RIGHT ARM;  Surgeon: [name], MD;  Location: [facility];  Service: Cardiovascular;  Laterality: Right;  . COLONOSCOPY W/ BIOPSIES AND POLYPECTOMY    . EYE SURGERY    . IR FLUORO GUIDE CV LINE RIGHT  03/18/2018  . IR US GUIDE VASC ACCESS RIGHT  03/18/2018  . NEPHRECTOMY     partial - benign tumor  . PARTIAL NEPHRECTOMY  10/08/2011   Left kidney  . TONSILLECTOMY      Social History   Tobacco Use  . Smoking status: Former Smoker    Packs/day: 1.00    Years: 11.00    Pack years: 11.00    Types: Cigarettes, Pipe    Last attempt to quit: 08/07/1974    Years since quitting: 44.1  . Smokeless tobacco: Never Used  Substance Use Topics  . Alcohol use: Yes    Alcohol/week: 1.0 standard drinks    Types: 1 Cans of beer per week    Comment: 1 glass wine weekly  . Drug use: Not Currently    Types: Marijuana    Comment: Marijuana cookie/candy  1x monthly    Family History  Problem Relation Age of Onset  . Diabetes Mother   . Hypertension Mother   . COPD Mother   . Asthma Mother   . Diabetes Father   . Diabetes Sister   . Diabetes Brother   . Kidney disease Brother     Allergies  Allergen Reactions  . Wellbutrin [Bupropion] Anxiety    Hyper/anxious    Medication list has been reviewed and updated.  Current Outpatient Medications on File Prior to Visit  Medication Sig Dispense Refill  . allopurinol (ZYLOPRIM) 100 MG tablet TAKE 1 TABLET BY MOUTH ONCE DAILY (Patient taking differently: Take 100 mg by mouth daily. ) 90 tablet 3  . aspirin 81 MG tablet Take 81 mg by mouth daily.    Marland Kitchen atorvastatin (LIPITOR) 40 MG tablet Take 1 tablet (40 mg total) by mouth daily. 90 tablet 3  . blood glucose meter kit and supplies Pt requesting ONE TOUCH. Use up to 3 times daily as directed. (FOR ICD-10 E10.9, E11.9). 1 each 0  . Blood Glucose Monitoring Suppl (ONE TOUCH ULTRA 2) w/Device KIT Use as directed once a day.  E11.9 1 each 0  . docusate sodium (COLACE) 100 MG capsule Take 100 mg by mouth daily as needed for mild constipation.     . famotidine (PEPCID) 20 MG tablet Take 20 mg by mouth 2 (two) times daily.    . folic acid-vitamin b complex-vitamin c-selenium-zinc (DIALYVITE) 3 MG TABS tablet Take 1 tablet by mouth at bedtime.     Marland Kitchen glucose blood test strip Use as directed once a day.  Dx code E11.9 100 each 12  . NIFEdipine (ADALAT CC) 60 MG 24 hr tablet Take 1 tablet (60 mg total) by mouth at bedtime. 30 tablet 5  . ONETOUCH DELICA LANCETS 62X MISC 1 each by Does not apply route daily. Dx code E11.9 100 each 1  . oxyCODONE-acetaminophen (PERCOCET) 5-325 MG tablet Take 1 tablet by mouth every 6 (six) hours as needed for severe pain. 8 tablet 0  . pioglitazone (ACTOS) 30 MG tablet Take 1 tablet (30 mg total) by mouth daily. 90 tablet 1  . sertraline (ZOLOFT) 100 MG tablet TAKE 2 TABLETS BY MOUTH ONCE DAILY (Patient taking  differently: Take 100 mg by mouth daily. ) 180 tablet 3  . sevelamer carbonate (RENVELA) 800 MG tablet Take 1,600 mg by mouth 3 (three) times daily with meals. 1600 MG WITH EVERY MEAL, AND 800 MG  WITH EVERY SNACK     No current facility-administered medications on file prior to visit.     Review of Systems:  As per HPI- otherwise negative. No fever or chills He has otherwise felt well   Physical Examination: There were no vitals filed for this visit. There were no vitals filed for this visit. There is no height or weight on file to calculate BMI. Ideal Body Weight:    Pt observed on video today- he looks well- no cough, wheezing, distress noted  His BP is checked at dialysis at each visit  He reports normal BP  Assessment and Plan: Muscle spasm - Plan: methocarbamol (ROBAXIN) 500 MG tablet  Type 2 diabetes mellitus with stage 3 chronic kidney disease, with long-term current use of insulin (HCC)  Dyslipidemia  Essential hypertension - Plan: NIFEdipine (ADALAT CC) 60 MG 24 hr tablet  Virtual visit today for follow-up, and discuss a new issue with his back Roczen was hitting golf balls about 3 weeks ago and developed back spasm.  He has had this sort of issue in the past.  He has tried conservative treatment at home, but continues to have pain.  Will prescribe Robaxin him to use as needed for back pain, otherwise can continue lidocaine patches and heat. No dosage adjustment of Robaxin is required for his renal function He reports good control of his blood pressure, and good A1c at recent labs per dialysis He continues to take atorvastatin for cholesterol Refilled nifedipine, reports good blood pressure control  Signed Lamar Blinks, MD  I sent the patient the following my chart message instructions: It was great to talk to you today- I hope that you get to enjoy lots of golf this spring/ summer!    I refilled your nifedipine, and send in a prescription for Robaxin to use for  your back. Use the Robaxin as needed for muscle spasm.  Remember it can cause you to feel drowsy  Please contact me if you continue to have problems with your back over the next week or so

## 2018-10-04 ENCOUNTER — Encounter: Payer: Self-pay | Admitting: Family Medicine

## 2018-10-04 ENCOUNTER — Ambulatory Visit (INDEPENDENT_AMBULATORY_CARE_PROVIDER_SITE_OTHER): Payer: PPO | Admitting: Family Medicine

## 2018-10-04 ENCOUNTER — Other Ambulatory Visit: Payer: Self-pay

## 2018-10-04 DIAGNOSIS — E1122 Type 2 diabetes mellitus with diabetic chronic kidney disease: Secondary | ICD-10-CM

## 2018-10-04 DIAGNOSIS — I10 Essential (primary) hypertension: Secondary | ICD-10-CM

## 2018-10-04 DIAGNOSIS — N183 Chronic kidney disease, stage 3 (moderate): Secondary | ICD-10-CM | POA: Diagnosis not present

## 2018-10-04 DIAGNOSIS — E785 Hyperlipidemia, unspecified: Secondary | ICD-10-CM

## 2018-10-04 DIAGNOSIS — M62838 Other muscle spasm: Secondary | ICD-10-CM | POA: Diagnosis not present

## 2018-10-04 DIAGNOSIS — Z794 Long term (current) use of insulin: Secondary | ICD-10-CM | POA: Diagnosis not present

## 2018-10-04 MED ORDER — NIFEDIPINE ER 60 MG PO TB24: 60.0000 mg | ORAL_TABLET | Freq: Every day | ORAL | 3 refills | Status: DC

## 2018-10-04 MED ORDER — METHOCARBAMOL 500 MG PO TABS: 500.0000 mg | ORAL_TABLET | Freq: Three times a day (TID) | ORAL | 0 refills | Status: DC | PRN

## 2018-10-04 NOTE — Patient Instructions (Addendum)
It was great to talk to you today- I hope that you get to enjoy lots of golf this spring/ summer!    I refilled your nifedipine, and send in a prescription for Robaxin to use for your back. Use the Robaxin as needed for muscle spasm.  Remember it can cause you to feel drowsy  Please contact me if you continue to have problems with your back over the next week or so

## 2018-10-09 ENCOUNTER — Encounter: Payer: Self-pay | Admitting: Family Medicine

## 2018-10-09 ENCOUNTER — Telehealth: Payer: Self-pay | Admitting: Family Medicine

## 2018-10-09 NOTE — Telephone Encounter (Signed)
Please advise- virtual visit on 10/04/2018.

## 2018-10-09 NOTE — Telephone Encounter (Signed)
Received message, my chart message to patient

## 2018-10-09 NOTE — Telephone Encounter (Signed)
Copied from [place] [phone]. Topic: General - Other >> Oct 09, 2018  3:37 PM Pauline Good wrote: Reason for CRM: pt was told to call office by Dr Lorelei Pont to let her know he is still having back pain. It hurts him when he is sleeping also. Please advise pt

## 2018-10-18 NOTE — Progress Notes (Signed)
[place] at [facility] [address] [place], [place] [address] [phone]  Date:  10/19/2018   Name:  Scott Jones   DOB:  30-Oct-1945   MRN:  [id number]  PCP:  Darreld Mclean, MD    Chief Complaint: Back Pain (lower back pain, possible pulled muscle golfing, happened 1 month ago)   History of Present Illness:  Scott Jones is a 73 y.o. very pleasant male patient who presents with the following:  In person visit today due to back pain We did a virtual visit for the same issue about 3 weeks ago, as symptoms did not resolve I asked him to come in History of end-stage renal disease on dialysis, diabetes, hypertension, hepatitis C status post curative treatment  His nephrologist is Dr. Maricela Curet  Per his history on May 13: He is overall doing ok, but has had some recent issues with back pain.  He is tender/ has spasm in his lower back. He had this sort of issue about 18 months ago- it took at long time to resolve but eventually did go away He notes that 3 weeks ago he hit some golf balls at the range and seems to have irritated his back again He is using lidocaine patches and also capsaicin cream He is sleeping with a heating pad He has used tylenol prn  He also had some oxycodone from his recent fistula surgery that he tried for his back - this helped but he ran out  No radiation to his legs No numbness or weakness of his legs  However if he walks for any distance his back hurts more   I gave him a prescription for Robaxin He last had lumbar films in November 2018, they did show a compression fracture as follows: IMPRESSION: New from the prior chest x-ray and of indeterminate age is a mild T12 superior endplate compression fracture with 15-20% loss of height without retropulsion. Mild L4-5 disc space narrowing. Mild L5-S1 facet degenerative changes.  At this point Alhaji notes that he is a bit better  although not much better He is using Robaxin- is almost out, it does help some  They sleep in a high bed frame- trying to get out of bed is a process for him He is more stiff when he first gets up in the morning   Lab Results  Component Value Date   HGBA1C 5.9 09/28/2017   He reports that his A1c is also managed by dialysis- is is in the 5s per his report   He needs a refill of his allopurinol, uses to prevent gout.  He rarely has any gout symptoms Patient Active Problem List   Diagnosis Date Noted  . Malnutrition of moderate degree 04/05/2018  . ESRD (end stage renal disease) ([place]) 04/03/2018  . Infection of AV graft for dialysis ([place]) 04/03/2018  . Acute gouty arthritis 10/13/2016  . H/O partial nephrectomy 02/16/2016  . Depression (emotion) 12/28/2015  . Allergic rhinitis 12/28/2015  . History of hepatitis C 12/28/2015  . Dyslipidemia 12/28/2015  . Chronic kidney disease (CKD), stage IV (severe) ([place]) 12/28/2015  . DM (diabetes mellitus) ([place]) 02/02/2012  . HTN (hypertension) 02/02/2012    Past Medical History:  Diagnosis Date  . Allergy   . Anemia   . Asthma   . Chronic kidney disease   . Depression   . Deviated septum   . Diabetes mellitus   . Hearing  aid worn     B/L  . Hepatitis C   . Hypertension   . Infection 03/2018   RIGHT ARM DIALYSIS CATHETER  . Pneumonia   . Wears glasses     Past Surgical History:  Procedure Laterality Date  . AV FISTULA PLACEMENT    . AV FISTULA PLACEMENT Right 09/21/2018   Procedure: ARTERIOVENOUS (AV) FISTULA CREATION RIGHT ARM;  Surgeon: [name], MD;  Location: [facility];  Service: Cardiovascular;  Laterality: Right;  . COLONOSCOPY W/ BIOPSIES AND POLYPECTOMY    . EYE SURGERY    . IR FLUORO GUIDE CV LINE RIGHT  03/18/2018  . IR US GUIDE VASC ACCESS RIGHT  03/18/2018  . NEPHRECTOMY     partial - benign tumor  . PARTIAL NEPHRECTOMY  10/08/2011   Left kidney  . TONSILLECTOMY      Social History   Tobacco  Use  . Smoking status: Former Smoker    Packs/day: 1.00    Years: 11.00    Pack years: 11.00    Types: Cigarettes, Pipe    Last attempt to quit: 08/07/1974    Years since quitting: 44.2  . Smokeless tobacco: Never Used  Substance Use Topics  . Alcohol use: Yes    Alcohol/week: 1.0 standard drinks    Types: 1 Cans of beer per week    Comment: 1 glass wine weekly  . Drug use: Not Currently    Types: Marijuana    Comment: Marijuana cookie/candy 1x monthly    Family History  Problem Relation Age of Onset  . Diabetes Mother   . Hypertension Mother   . COPD Mother   . Asthma Mother   . Diabetes Father   . Diabetes Sister   . Diabetes Brother   . Kidney disease Brother     Allergies  Allergen Reactions  . Wellbutrin [Bupropion] Anxiety    Hyper/anxious    Medication list has been reviewed and updated.  Current Outpatient Medications on File Prior to Visit  Medication Sig Dispense Refill  . allopurinol (ZYLOPRIM) 100 MG tablet TAKE 1 TABLET BY MOUTH ONCE DAILY (Patient taking differently: Take 100 mg by mouth daily. ) 90 tablet 3  . aspirin 81 MG tablet Take 81 mg by mouth daily.    Marland Kitchen atorvastatin (LIPITOR) 40 MG tablet Take 1 tablet (40 mg total) by mouth daily. 90 tablet 3  . blood glucose meter kit and supplies Pt requesting ONE TOUCH. Use up to 3 times daily as directed. (FOR ICD-10 E10.9, E11.9). 1 each 0  . Blood Glucose Monitoring Suppl (ONE TOUCH ULTRA 2) w/Device KIT Use as directed once a day.  E11.9 1 each 0  . docusate sodium (COLACE) 100 MG capsule Take 100 mg by mouth daily as needed for mild constipation.     . folic acid-vitamin b complex-vitamin c-selenium-zinc (DIALYVITE) 3 MG TABS tablet Take 1 tablet by mouth at bedtime.     Marland Kitchen glucose blood test strip Use as directed once a day.  Dx code E11.9 100 each 12  . methocarbamol (ROBAXIN) 500 MG tablet Take 1 tablet (500 mg total) by mouth every 8 (eight) hours as needed for muscle spasms. 30 tablet 0  .  NIFEdipine (ADALAT CC) 60 MG 24 hr tablet Take 1 tablet (60 mg total) by mouth at bedtime. 90 tablet 3  . ONETOUCH DELICA LANCETS 09G MISC 1 each by Does not apply route daily. Dx code E11.9 100 each 1  . pioglitazone (ACTOS) 30 MG  tablet Take 1 tablet (30 mg total) by mouth daily. 90 tablet 1  . sertraline (ZOLOFT) 100 MG tablet TAKE 2 TABLETS BY MOUTH ONCE DAILY (Patient taking differently: Take 100 mg by mouth daily. ) 180 tablet 3  . sevelamer carbonate (RENVELA) 800 MG tablet Take 1,600 mg by mouth 3 (three) times daily with meals. 1600 MG WITH EVERY MEAL, AND 800 MG WITH EVERY SNACK     No current facility-administered medications on file prior to visit.     Review of Systems:  As per HPI- otherwise negative.   Physical Examination: Vitals:   10/19/18 1009  BP: 136/70  Pulse: 74  Resp: 16  Temp: 98.6 F (37 C)  SpO2: 98%   Vitals:   10/19/18 1009  Weight: 169 lb (76.7 kg)  Height: 5' 5.5" (1.664 m)   Body mass index is 27.7 kg/m. Ideal Body Weight: Weight in (lb) to have BMI = 25: 152.2  GEN: WDWN, NAD, Non-toxic, A & O x 3, mild overweight, looks well HEENT: Atraumatic, Normocephalic. Neck supple. No masses, No LAD.  TM within normal's bilaterally Ears and Nose: No external deformity. CV: RRR, No M/G/R. No JVD. No thrill. No extra heart sounds. PULM: CTA B, no wheezes, crackles, rhonchi. No retractions. No resp. distress. No accessory muscle use. ABD: S, NT, ND, +BS. No rebound. No HSM. EXTR: No c/c/e NEURO Normal gait.  PSYCH: Normally interactive. Conversant. Not depressed or anxious appearing.  Calm demeanor.  Patient has tenderness in the muscles at the thoracolumbar juncture bilaterally.  No bony tenderness is appreciated.  He has good flexion and extension of his spine.  Normal strength and sensation of both lower extremities, normal patellar tendon reflexes, negative straight leg raise Foot exam was performed today, normal   Assessment and Plan: Gout due  to renal impairment, unspecified chronicity, unspecified site - Plan: allopurinol (ZYLOPRIM) 100 MG tablet  Muscle spasm - Plan: methocarbamol (ROBAXIN) 500 MG tablet  Acute midline thoracic back pain - Plan: DG Lumbar Spine Complete, DG Thoracic Spine 2 View  Following up in the office today due to persistent mid to lower back pain. He did have a compression fracture about a year and a half ago, we wonder if this may have worsened or if he has a new fracture We will obtain plain films today Refilled his Robaxin for as needed use, and allopurinol for gout prophylaxis  Signed Lamar Blinks, MD  Received his x-ray reports as follows, message to patient  Dg Thoracic Spine 2 View  Result Date: 10/19/2018 CLINICAL DATA:  Acute midline thoracic pain. EXAM: THORACIC SPINE 2 VIEWS COMPARISON:  Chest two-view 04/03/2018 FINDINGS: Chronic compression fracture T12. Compression fracture superior endplate of L1 was not present previously. See lumbar spine report. No other thoracic fracture. Right jugular central venous catheter tip in the right atrium. IMPRESSION: Chronic compression fracture T12 New fracture L1. Electronically Signed   By: Franchot Gallo M.D.   On: 10/19/2018 11:06   Dg Lumbar Spine Complete  Result Date: 10/19/2018 CLINICAL DATA:  Acute midline thoracic back pain.  Rule out fracture EXAM: LUMBAR SPINE - COMPLETE 4+ VIEW COMPARISON:  Lumbar radiographs 03/28/2017 FINDINGS: Chronic compression fracture T12 unchanged. New compression fracture superior endplate of L1. Mild fracture of L1 which is likely chronic. No other fracture. Generalized osteopenia. Normal alignment.  No significant disc space narrowing. Metal coils in the left upper abdomen possibly in the kidney. IMPRESSION: Mild superior endplate compression fracture L1 likely acute Chronic compression fracture  T12 Electronically Signed   By: Franchot Gallo M.D.   On: 10/19/2018 11:04   Chronic fracture of T12 is unchanged.   However he does have a new fracture of L1

## 2018-10-19 ENCOUNTER — Other Ambulatory Visit: Payer: Self-pay

## 2018-10-19 ENCOUNTER — Encounter: Payer: Self-pay | Admitting: Family Medicine

## 2018-10-19 ENCOUNTER — Ambulatory Visit (INDEPENDENT_AMBULATORY_CARE_PROVIDER_SITE_OTHER): Payer: PPO | Admitting: Family Medicine

## 2018-10-19 ENCOUNTER — Ambulatory Visit (HOSPITAL_BASED_OUTPATIENT_CLINIC_OR_DEPARTMENT_OTHER)
Admission: RE | Admit: 2018-10-19 | Discharge: 2018-10-19 | Disposition: A | Discharge status: Home / Self Care | Payer: PPO | Source: Ambulatory Visit | Attending: Family Medicine | Admitting: Family Medicine

## 2018-10-19 VITALS — BP 136/70 | HR 74 | Temp 98.6°F | Resp 16 | Ht 65.5 in | Wt 169.0 lb

## 2018-10-19 DIAGNOSIS — S22080D Wedge compression fracture of T11-T12 vertebra, subsequent encounter for fracture with routine healing: Secondary | ICD-10-CM

## 2018-10-19 DIAGNOSIS — M1030 Gout due to renal impairment, unspecified site: Secondary | ICD-10-CM

## 2018-10-19 DIAGNOSIS — M545 Low back pain: Secondary | ICD-10-CM | POA: Diagnosis not present

## 2018-10-19 DIAGNOSIS — M546 Pain in thoracic spine: Secondary | ICD-10-CM | POA: Insufficient documentation

## 2018-10-19 DIAGNOSIS — M62838 Other muscle spasm: Secondary | ICD-10-CM

## 2018-10-19 DIAGNOSIS — S32010A Wedge compression fracture of first lumbar vertebra, initial encounter for closed fracture: Secondary | ICD-10-CM | POA: Diagnosis not present

## 2018-10-19 MED ORDER — METHOCARBAMOL 500 MG PO TABS: 500.0000 mg | ORAL_TABLET | Freq: Three times a day (TID) | ORAL | 1 refills | Status: DC | PRN

## 2018-10-19 MED ORDER — ALLOPURINOL 100 MG PO TABS: 100.0000 mg | ORAL_TABLET | Freq: Every day | ORAL | 3 refills | Status: DC

## 2018-10-19 NOTE — Patient Instructions (Signed)
It was good to see you today- I will be in touch with your x-rays asap For the time being continue to use your robaxin as needed If you do have a compression fracture again I would recommend that we check your bone density as well with a bone density scan

## 2018-10-26 ENCOUNTER — Ambulatory Visit (HOSPITAL_BASED_OUTPATIENT_CLINIC_OR_DEPARTMENT_OTHER)
Admission: RE | Admit: 2018-10-26 | Discharge: 2018-10-26 | Disposition: A | Discharge status: Home / Self Care | Payer: PPO | Source: Ambulatory Visit | Attending: Family Medicine | Admitting: Family Medicine

## 2018-10-26 ENCOUNTER — Encounter: Payer: Self-pay | Admitting: Family Medicine

## 2018-10-26 ENCOUNTER — Other Ambulatory Visit: Payer: Self-pay

## 2018-10-26 DIAGNOSIS — S32010A Wedge compression fracture of first lumbar vertebra, initial encounter for closed fracture: Secondary | ICD-10-CM | POA: Insufficient documentation

## 2018-10-26 DIAGNOSIS — S22080D Wedge compression fracture of T11-T12 vertebra, subsequent encounter for fracture with routine healing: Secondary | ICD-10-CM | POA: Diagnosis not present

## 2018-10-26 DIAGNOSIS — M810 Age-related osteoporosis without current pathological fracture: Secondary | ICD-10-CM | POA: Diagnosis not present

## 2018-11-01 ENCOUNTER — Other Ambulatory Visit: Payer: Self-pay

## 2018-11-01 DIAGNOSIS — N186 End stage renal disease: Secondary | ICD-10-CM

## 2018-11-03 ENCOUNTER — Other Ambulatory Visit: Payer: Self-pay

## 2018-11-03 ENCOUNTER — Ambulatory Visit (HOSPITAL_COMMUNITY)
Admission: RE | Admit: 2018-11-03 | Discharge: 2018-11-03 | Disposition: A | Discharge status: Home / Self Care | Payer: PPO | Source: Ambulatory Visit | Attending: Vascular Surgery | Admitting: Vascular Surgery

## 2018-11-03 ENCOUNTER — Ambulatory Visit (INDEPENDENT_AMBULATORY_CARE_PROVIDER_SITE_OTHER): Payer: PPO | Admitting: Vascular Surgery

## 2018-11-03 ENCOUNTER — Encounter: Payer: Self-pay | Admitting: Vascular Surgery

## 2018-11-03 VITALS — BP 130/69 | HR 67 | Temp 97.4°F | Resp 20 | Ht 65.0 in | Wt 170.0 lb

## 2018-11-03 DIAGNOSIS — N186 End stage renal disease: Secondary | ICD-10-CM | POA: Insufficient documentation

## 2018-11-03 NOTE — Progress Notes (Signed)
    Subjective:     Patient ID: Scott Jones, male   DOB: 12-31-1945, 73 y.o.   MRN: [id number]  HPI 73 year old male with end-stage renal disease currently dialyzing via catheter.  On April 30 we placed a right arm brachiocephalic AV fistula.  This was above an existing graft and the vein was confirmed to be thickened at the time of operation.   Review of Systems No current issues    Objective:   Physical Exam  Vitals:   11/03/18 1137  BP: 130/69  Pulse: 67  Resp: 20  Temp: (!) 97.4 F (36.3 C)  SpO2: 97%   Awake alert oriented On the respirations Right arm with very strong thrill in the upper arm Palpable right radial pulse   I have independently interpreted his dialysis duplex to have flow volumes 1965 mL/min with diameters extending up to 0.9 cm at the antecubital fossa.     Assessment:     73 year old male with end-stage renal disease on dialysis via catheter.  Right arm AV fistula was created using a mostly mature vein.  Should be ready for use next dialysis session.  He can follow-up on an as-needed basis.        Zuleyka Kloc C. Donzetta Matters, MD [facility] Office: [phone] Pager: [phone] Area

## 2018-11-21 ENCOUNTER — Other Ambulatory Visit: Payer: Self-pay

## 2018-11-21 ENCOUNTER — Telehealth: Payer: Self-pay | Admitting: Family Medicine

## 2018-11-21 DIAGNOSIS — K7689 Other specified diseases of liver: Secondary | ICD-10-CM | POA: Diagnosis not present

## 2018-11-21 DIAGNOSIS — N186 End stage renal disease: Secondary | ICD-10-CM | POA: Diagnosis not present

## 2018-11-21 DIAGNOSIS — N2581 Secondary hyperparathyroidism of renal origin: Secondary | ICD-10-CM | POA: Diagnosis not present

## 2018-11-21 DIAGNOSIS — D631 Anemia in chronic kidney disease: Secondary | ICD-10-CM | POA: Diagnosis not present

## 2018-11-21 DIAGNOSIS — D689 Coagulation defect, unspecified: Secondary | ICD-10-CM | POA: Diagnosis not present

## 2018-11-21 DIAGNOSIS — R17 Unspecified jaundice: Secondary | ICD-10-CM | POA: Diagnosis not present

## 2018-11-21 DIAGNOSIS — Z23 Encounter for immunization: Secondary | ICD-10-CM | POA: Diagnosis not present

## 2018-11-21 DIAGNOSIS — Z79899 Other long term (current) drug therapy: Secondary | ICD-10-CM | POA: Diagnosis not present

## 2018-11-21 NOTE — Telephone Encounter (Signed)
Pt called in to scheduled an ov with PCP. Pt says that he would also like to have his shingles vaccination as well   Please advise.

## 2018-11-22 ENCOUNTER — Ambulatory Visit (INDEPENDENT_AMBULATORY_CARE_PROVIDER_SITE_OTHER): Payer: PPO | Admitting: Family Medicine

## 2018-11-22 ENCOUNTER — Ambulatory Visit (HOSPITAL_BASED_OUTPATIENT_CLINIC_OR_DEPARTMENT_OTHER)
Admission: RE | Admit: 2018-11-22 | Discharge: 2018-11-22 | Disposition: A | Discharge status: Home / Self Care | Payer: PPO | Source: Ambulatory Visit | Attending: Family Medicine | Admitting: Family Medicine

## 2018-11-22 ENCOUNTER — Encounter: Payer: Self-pay | Admitting: Family Medicine

## 2018-11-22 VITALS — BP 157/68 | HR 67 | Temp 98.6°F | Resp 18 | Ht 65.0 in | Wt 169.4 lb

## 2018-11-22 DIAGNOSIS — M25562 Pain in left knee: Secondary | ICD-10-CM | POA: Insufficient documentation

## 2018-11-22 DIAGNOSIS — Z23 Encounter for immunization: Secondary | ICD-10-CM

## 2018-11-22 DIAGNOSIS — M1712 Unilateral primary osteoarthritis, left knee: Secondary | ICD-10-CM | POA: Diagnosis not present

## 2018-11-22 DIAGNOSIS — R030 Elevated blood-pressure reading, without diagnosis of hypertension: Secondary | ICD-10-CM | POA: Diagnosis not present

## 2018-11-22 DIAGNOSIS — S8992XA Unspecified injury of left lower leg, initial encounter: Secondary | ICD-10-CM | POA: Diagnosis not present

## 2018-11-22 MED ORDER — MELOXICAM 7.5 MG PO TABS: 7.5000 mg | ORAL_TABLET | Freq: Every day | ORAL | 0 refills | Status: AC

## 2018-11-22 NOTE — Progress Notes (Signed)
Musculoskeletal Exam  Patient: Scott Jones DOB: [birth year]  DOS: 11/22/2018  SUBJECTIVE:  Chief Complaint:   Chief Complaint  Patient presents with  . Fall    x1 week, left knee, states some swelling, having pain with weight bearing    Scott Jones is a 73 y.o.  male for evaluation and treatment of L knee pain.   Onset:  1 week ago. Fell at [employer] Location: L kneecap Character:  aching  Progression of issue:  is unchanged Associated symptoms: swelling, early on was giving out 2/2 pain Treatment: to date has been rest, hold/cold compresses.   Neurovascular symptoms: no  ROS: Musculoskeletal/Extremities: +L knee pain  Past Medical History:  Diagnosis Date  . Allergy   . Anemia   . Asthma   . Chronic kidney disease   . Depression   . Deviated septum   . Diabetes mellitus   . Hearing aid worn     B/L  . Hepatitis C   . Hypertension   . Infection 03/2018   RIGHT ARM DIALYSIS CATHETER  . Pneumonia   . Wears glasses     Objective: VITAL SIGNS: BP (!) 157/68 (BP Location: Left Arm, Patient Position: Sitting, Cuff Size: Normal)   Pulse 67   Temp 98.6 F (37 C) (Oral)   Resp 18   Ht $R'5\' 5"'VV$  (1.651 m)   Wt 169 lb 6.4 oz (76.8 kg)   SpO2 100%   BMI 28.19 kg/m  Constitutional: Well formed, well developed. No acute distress. Cardiovascular: Brisk cap refill Thorax & Lungs: No accessory muscle use Musculoskeletal: L knee.   Normal active range of motion: yes.   Normal passive range of motion: yes Tenderness to palpation: yes, over patella Deformity: no Ecchymosis: no Tests positive: none Tests negative: Lachman's, Stine's, varus/valgus, McMurray's, patellar app Neurologic: Normal sensory function. No focal deficits noted.  Psychiatric: Normal mood. Age appropriate judgment and insight. Alert & oriented x 3.    Assessment:  Acute pain of left knee - Plan: DG Knee 4 Views W/Patella Left, XR neg, await official read; ice, Mobic for 7 d,  Tylenol.  Need for shingles vaccine - Plan: Varicella-zoster vaccine IM (Shingrix)  Plan: Orders as above. Monitor BP w reg PPC.  F/u prn. The patient voiced understanding and agreement to the plan.   Prentice, DO 11/22/18  4:15 PM

## 2018-11-22 NOTE — Patient Instructions (Signed)
Ice/cold pack over area for 10-15 min twice daily.  OK to take Tylenol 1000 mg (2 extra strength tabs) or 975 mg (3 regular strength tabs) every 6 hours as needed.  Your X-ray is normal, we will let you know if anything changes though. No news is good news.   Let us know if you need anything.

## 2018-12-07 ENCOUNTER — Other Ambulatory Visit: Payer: Self-pay

## 2018-12-07 ENCOUNTER — Encounter (HOSPITAL_COMMUNITY): Payer: Self-pay

## 2018-12-07 ENCOUNTER — Emergency Department (HOSPITAL_COMMUNITY)
Admission: EM | Admit: 2018-12-07 | Discharge: 2018-12-07 | Disposition: A | Discharge status: Home / Self Care | Payer: No Typology Code available for payment source | Attending: Emergency Medicine | Admitting: Emergency Medicine

## 2018-12-07 DIAGNOSIS — Y712 Prosthetic and other implants, materials and accessory cardiovascular devices associated with adverse incidents: Secondary | ICD-10-CM | POA: Diagnosis not present

## 2018-12-07 DIAGNOSIS — Z87891 Personal history of nicotine dependence: Secondary | ICD-10-CM | POA: Diagnosis not present

## 2018-12-07 DIAGNOSIS — Z992 Dependence on renal dialysis: Secondary | ICD-10-CM | POA: Insufficient documentation

## 2018-12-07 DIAGNOSIS — T82838A Hemorrhage of vascular prosthetic devices, implants and grafts, initial encounter: Secondary | ICD-10-CM | POA: Insufficient documentation

## 2018-12-07 DIAGNOSIS — I120 Hypertensive chronic kidney disease with stage 5 chronic kidney disease or end stage renal disease: Secondary | ICD-10-CM | POA: Insufficient documentation

## 2018-12-07 DIAGNOSIS — Z79899 Other long term (current) drug therapy: Secondary | ICD-10-CM | POA: Insufficient documentation

## 2018-12-07 DIAGNOSIS — Z8709 Personal history of other diseases of the respiratory system: Secondary | ICD-10-CM | POA: Insufficient documentation

## 2018-12-07 DIAGNOSIS — F1290 Cannabis use, unspecified, uncomplicated: Secondary | ICD-10-CM | POA: Insufficient documentation

## 2018-12-07 DIAGNOSIS — N186 End stage renal disease: Secondary | ICD-10-CM | POA: Insufficient documentation

## 2018-12-07 NOTE — Discharge Instructions (Addendum)
It was our pleasure to provide your ER care today - we hope that you feel better.  If bleeding recurs, hold direct pressure with fingertip directly on the bleeding spot for 15 minutes without letting up to check if still bleeding. If bleeding persists, return to ER.  Return to ER if worse, new symptoms, persistent bleeding, weak/faint, other concern.   Follow up with your doctors tomorrow as planned.

## 2018-12-07 NOTE — ED Triage Notes (Signed)
Pt arrives POV for eval of RUE arm fistula bleeding. Pt reports he is a Tu/Th/Sat dialysis pt and completed his treatment today around1500. PT reports that his fistula has been oozing since, and he is unable to stop the bleeding. Small amt of blood noted on his bandage, bleeding is fairly controlled.

## 2018-12-07 NOTE — ED Notes (Signed)
Pt discharged from ED; instructions provided; Pt encouraged to return to ED if symptoms worsen and to f/u with PCP; Pt verbalized understanding of all instructions 

## 2018-12-07 NOTE — ED Provider Notes (Addendum)
[facility] EMERGENCY DEPARTMENT Provider Note   CSN: [id number] Arrival date & time: 12/07/18  1839     History   Chief Complaint Chief Complaint  Patient presents with  . Fistula Bleeding    HPI Scott Jones is a 73 y.o. male.     Patient with hx esrd, hd, had hd today. Post hd persistent slow ooze of blood from RUE fistula. Denies antigoag use (other than heparin w hd). No other abnormal bruising or bleeding. No faintness or dizziness. No chest pain or sob. Symptoms acute onset, mild, persistent. Denies arm swelling, numbness or weakness. No fever or chills.   The history is provided by the patient.    Past Medical History:  Diagnosis Date  . Allergy   . Anemia   . Asthma   . Chronic kidney disease   . Depression   . Deviated septum   . Diabetes mellitus   . Hearing aid worn     B/L  . Hepatitis C   . Hypertension   . Infection 03/2018   RIGHT ARM DIALYSIS CATHETER  . Pneumonia   . Wears glasses     Patient Active Problem List   Diagnosis Date Noted  . Osteoporosis 10/26/2018  . Malnutrition of moderate degree 04/05/2018  . ESRD (end stage renal disease) ([facility]) 04/03/2018  . Infection of AV graft for dialysis ([place]) 04/03/2018  . Acute gouty arthritis 10/13/2016  . H/O partial nephrectomy 02/16/2016  . Depression (emotion) 12/28/2015  . Allergic rhinitis 12/28/2015  . History of hepatitis C 12/28/2015  . Dyslipidemia 12/28/2015  . Chronic kidney disease (CKD), stage IV (severe) ([place]) 12/28/2015  . DM (diabetes mellitus) ([place]) 02/02/2012  . HTN (hypertension) 02/02/2012    Past Surgical History:  Procedure Laterality Date  . AV FISTULA PLACEMENT    . AV FISTULA PLACEMENT Right 09/21/2018   Procedure: ARTERIOVENOUS (AV) FISTULA CREATION RIGHT ARM;  Surgeon: [name], MD;  Location: [facility];  Service: Cardiovascular;  Laterality: Right;  . COLONOSCOPY W/ BIOPSIES AND POLYPECTOMY    . EYE SURGERY    . IR FLUORO  GUIDE CV LINE RIGHT  03/18/2018  . IR US GUIDE VASC ACCESS RIGHT  03/18/2018  . NEPHRECTOMY     partial - benign tumor  . PARTIAL NEPHRECTOMY  10/08/2011   Left kidney  . TONSILLECTOMY          Home Medications    Prior to Admission medications   Medication Sig Start Date End Date Taking? Authorizing Provider  allopurinol (ZYLOPRIM) 100 MG tablet Take 1 tablet (100 mg total) by mouth daily. 10/19/18   Copland, Gay Filler, MD  aspirin 81 MG tablet Take 81 mg by mouth daily.    [provider]  atorvastatin (LIPITOR) 40 MG tablet Take 1 tablet (40 mg total) by mouth daily. 04/14/18   Copland, Gay Filler, MD  blood glucose meter kit and supplies Pt requesting ONE TOUCH. Use up to 3 times daily as directed. (FOR ICD-10 E10.9, E11.9). 05/18/18   Copland, Gay Filler, MD  Blood Glucose Monitoring Suppl (ONE TOUCH ULTRA 2) w/Device KIT Use as directed once a day.  E11.9 05/22/18   Copland, Gay Filler, MD  docusate sodium (COLACE) 100 MG capsule Take 100 mg by mouth daily as needed for mild constipation.  10/10/11   [provider]  folic acid-vitamin b complex-vitamin c-selenium-zinc (DIALYVITE) 3 MG TABS tablet Take 1 tablet by mouth at bedtime.     [provider]  glucose blood test strip Use as directed once a day.  Dx code E11.9 05/22/18   Copland, Gay Filler, MD  methocarbamol (ROBAXIN) 500 MG tablet Take 1 tablet (500 mg total) by mouth every 8 (eight) hours as needed for muscle spasms. 10/19/18   Copland, Gay Filler, MD  NIFEdipine (ADALAT CC) 60 MG 24 hr tablet Take 1 tablet (60 mg total) by mouth at bedtime. 10/04/18   Copland, Gay Filler, MD  [facility] DELICA LANCETS 74Y MISC 1 each by Does not apply route daily. Dx code E11.9 05/22/18   Copland, Gay Filler, MD  pioglitazone (ACTOS) 30 MG tablet Take 1 tablet (30 mg total) by mouth daily. 07/21/18   Copland, Gay Filler, MD  sertraline (ZOLOFT) 100 MG tablet TAKE 2 TABLETS BY MOUTH ONCE DAILY Patient taking differently: Take  100 mg by mouth daily.  01/30/18   Copland, Gay Filler, MD  sevelamer carbonate (RENVELA) 800 MG tablet Take 1,600 mg by mouth 3 (three) times daily with meals. 1600 MG WITH EVERY MEAL, AND 800 MG WITH EVERY SNACK    [provider]    Family History Family History  Problem Relation Age of Onset  . Diabetes Mother   . Hypertension Mother   . COPD Mother   . Asthma Mother   . Diabetes Father   . Diabetes Sister   . Diabetes Brother   . Kidney disease Brother     Social History Social History   Tobacco Use  . Smoking status: Former Smoker    Packs/day: 1.00    Years: 11.00    Pack years: 11.00    Types: Cigarettes, Pipe    Quit date: 08/07/1974    Years since quitting: 44.3  . Smokeless tobacco: Never Used  Substance Use Topics  . Alcohol use: Yes    Alcohol/week: 1.0 standard drinks    Types: 1 Cans of beer per week    Comment: 1 glass wine weekly  . Drug use: Not Currently    Types: Marijuana    Comment: Marijuana cookie/candy 1x monthly     Allergies   Wellbutrin [bupropion]   Review of Systems Review of Systems  Constitutional: Negative for fever.  HENT: Negative for nosebleeds.   Eyes: Negative for redness.  Respiratory: Negative for shortness of breath.   Cardiovascular: Negative for chest pain.  Gastrointestinal: Negative for blood in stool.  Genitourinary: Negative for hematuria.  Musculoskeletal: Negative for neck pain.  Skin: Negative for rash.  Neurological: Negative for light-headedness.  Hematological: Does not bruise/bleed easily.  Psychiatric/Behavioral: Negative for confusion.     Physical Exam Updated Vital Signs BP (!) 163/74 (BP Location: Left Arm)   Pulse 60   Temp 98.7 F (37.1 C) (Oral)   Resp 14   Ht 1.549 m ('5\' 1"'$ )   Wt 77 kg   SpO2 100%   BMI 32.07 kg/m   Physical Exam Vitals signs and nursing note reviewed.  Constitutional:      Appearance: Normal appearance. He is well-developed.  HENT:     Head: Atraumatic.      Nose: Nose normal.     Mouth/Throat:     Mouth: Mucous membranes are moist.     Pharynx: Oropharynx is clear.  Eyes:     General: No scleral icterus.    Conjunctiva/sclera: Conjunctivae normal.  Neck:     Musculoskeletal: Normal range of motion and neck supple. No neck rigidity.     Trachea: No tracheal deviation.  Cardiovascular:  Rate and Rhythm: Normal rate.     Pulses: Normal pulses.  Pulmonary:     Effort: Pulmonary effort is normal. No accessory muscle usage or respiratory distress.  Abdominal:     General: There is no distension.  Genitourinary:    Comments: No cva tenderness. Musculoskeletal:        General: No swelling.     Comments: RUE, slow ooze of blood from fistula site. No ulceration to skin. Radial pulse palp. No arm swelling.   Skin:    General: Skin is warm and dry.     Findings: No rash.  Neurological:     Mental Status: He is alert.     Comments: Alert, speech clear.   Psychiatric:        Mood and Affect: Mood normal.      ED Treatments / Results  Labs (all labs ordered are listed, but only abnormal results are displayed) Labs Reviewed - No data to display  EKG None  Radiology No results found.  Procedures Procedures (including critical care time)  Medications Ordered in ED Medications - No data to display   Initial Impression / Assessment and Plan / ED Course  I have reviewed the triage vital signs and the nursing notes.  Pertinent labs & imaging results that were available during my care of the patient were reviewed by me and considered in my medical decision making (see chart for details).  Pressure dressing. Slow ooze of blood.  dermabond applied.  New sterile dressing.   Recheck after several minutes, no bleeding through dressing.   Bleeding resolved. Pt appears stable for d/c. Pt indicates is following up tomorrow w dialysis (for plan to start home dialysis training).   Recheck again - no bleeding. Pt eating/drinking.  Stable for d/c.    Final Clinical Impressions(s) / ED Diagnoses   Final diagnoses:  None    ED Discharge Orders    None          Lajean Saver, MD 12/07/18 2206

## 2018-12-21 ENCOUNTER — Other Ambulatory Visit: Payer: Self-pay

## 2019-01-17 DIAGNOSIS — I120 Hypertensive chronic kidney disease with stage 5 chronic kidney disease or end stage renal disease: Secondary | ICD-10-CM | POA: Diagnosis not present

## 2019-01-17 DIAGNOSIS — E1122 Type 2 diabetes mellitus with diabetic chronic kidney disease: Secondary | ICD-10-CM | POA: Diagnosis not present

## 2019-01-17 DIAGNOSIS — Z905 Acquired absence of kidney: Secondary | ICD-10-CM | POA: Diagnosis not present

## 2019-01-17 DIAGNOSIS — I447 Left bundle-branch block, unspecified: Secondary | ICD-10-CM | POA: Diagnosis not present

## 2019-01-17 DIAGNOSIS — N261 Atrophy of kidney (terminal): Secondary | ICD-10-CM | POA: Diagnosis not present

## 2019-01-17 DIAGNOSIS — Z992 Dependence on renal dialysis: Secondary | ICD-10-CM | POA: Diagnosis not present

## 2019-01-17 DIAGNOSIS — Z01812 Encounter for preprocedural laboratory examination: Secondary | ICD-10-CM | POA: Diagnosis not present

## 2019-01-17 DIAGNOSIS — Z01810 Encounter for preprocedural cardiovascular examination: Secondary | ICD-10-CM | POA: Diagnosis not present

## 2019-01-17 DIAGNOSIS — Z79899 Other long term (current) drug therapy: Secondary | ICD-10-CM | POA: Diagnosis not present

## 2019-01-17 DIAGNOSIS — Z01818 Encounter for other preprocedural examination: Secondary | ICD-10-CM | POA: Diagnosis not present

## 2019-01-17 DIAGNOSIS — I129 Hypertensive chronic kidney disease with stage 1 through stage 4 chronic kidney disease, or unspecified chronic kidney disease: Secondary | ICD-10-CM | POA: Diagnosis not present

## 2019-01-17 DIAGNOSIS — N186 End stage renal disease: Secondary | ICD-10-CM | POA: Diagnosis not present

## 2019-01-24 DIAGNOSIS — N186 End stage renal disease: Secondary | ICD-10-CM | POA: Diagnosis not present

## 2019-01-24 DIAGNOSIS — Z20828 Contact with and (suspected) exposure to other viral communicable diseases: Secondary | ICD-10-CM | POA: Diagnosis not present

## 2019-01-24 DIAGNOSIS — R55 Syncope and collapse: Secondary | ICD-10-CM | POA: Diagnosis not present

## 2019-01-24 DIAGNOSIS — D631 Anemia in chronic kidney disease: Secondary | ICD-10-CM | POA: Diagnosis not present

## 2019-01-24 DIAGNOSIS — Z01812 Encounter for preprocedural laboratory examination: Secondary | ICD-10-CM | POA: Diagnosis not present

## 2019-01-25 ENCOUNTER — Other Ambulatory Visit: Payer: Self-pay

## 2019-01-25 ENCOUNTER — Emergency Department (HOSPITAL_COMMUNITY)
Admission: EM | Admit: 2019-01-25 | Discharge: 2019-01-25 | Disposition: A | Discharge status: Home / Self Care | Payer: No Typology Code available for payment source | Attending: Emergency Medicine | Admitting: Emergency Medicine

## 2019-01-25 ENCOUNTER — Encounter (HOSPITAL_COMMUNITY): Payer: Self-pay | Admitting: Emergency Medicine

## 2019-01-25 DIAGNOSIS — F1290 Cannabis use, unspecified, uncomplicated: Secondary | ICD-10-CM | POA: Diagnosis not present

## 2019-01-25 DIAGNOSIS — Z992 Dependence on renal dialysis: Secondary | ICD-10-CM | POA: Diagnosis not present

## 2019-01-25 DIAGNOSIS — I120 Hypertensive chronic kidney disease with stage 5 chronic kidney disease or end stage renal disease: Secondary | ICD-10-CM | POA: Diagnosis not present

## 2019-01-25 DIAGNOSIS — Z87891 Personal history of nicotine dependence: Secondary | ICD-10-CM | POA: Diagnosis not present

## 2019-01-25 DIAGNOSIS — R410 Disorientation, unspecified: Secondary | ICD-10-CM | POA: Diagnosis not present

## 2019-01-25 DIAGNOSIS — R55 Syncope and collapse: Secondary | ICD-10-CM | POA: Diagnosis not present

## 2019-01-25 DIAGNOSIS — Z7982 Long term (current) use of aspirin: Secondary | ICD-10-CM | POA: Insufficient documentation

## 2019-01-25 DIAGNOSIS — E1122 Type 2 diabetes mellitus with diabetic chronic kidney disease: Secondary | ICD-10-CM | POA: Insufficient documentation

## 2019-01-25 DIAGNOSIS — I440 Atrioventricular block, first degree: Secondary | ICD-10-CM | POA: Diagnosis not present

## 2019-01-25 DIAGNOSIS — D649 Anemia, unspecified: Secondary | ICD-10-CM | POA: Insufficient documentation

## 2019-01-25 DIAGNOSIS — R0602 Shortness of breath: Secondary | ICD-10-CM | POA: Insufficient documentation

## 2019-01-25 DIAGNOSIS — Z79899 Other long term (current) drug therapy: Secondary | ICD-10-CM | POA: Diagnosis not present

## 2019-01-25 DIAGNOSIS — I4430 Unspecified atrioventricular block: Secondary | ICD-10-CM | POA: Diagnosis not present

## 2019-01-25 DIAGNOSIS — D631 Anemia in chronic kidney disease: Secondary | ICD-10-CM | POA: Diagnosis not present

## 2019-01-25 DIAGNOSIS — N186 End stage renal disease: Secondary | ICD-10-CM | POA: Diagnosis not present

## 2019-01-25 DIAGNOSIS — R58 Hemorrhage, not elsewhere classified: Secondary | ICD-10-CM | POA: Diagnosis not present

## 2019-01-25 DIAGNOSIS — I447 Left bundle-branch block, unspecified: Secondary | ICD-10-CM | POA: Diagnosis not present

## 2019-01-25 LAB — BASIC METABOLIC PANEL
Anion gap: 15 (ref 5–15)
BUN: 55 mg/dL — ABNORMAL HIGH (ref 8–23)
CO2: 27 mmol/L (ref 22–32)
Calcium: 8.6 mg/dL — ABNORMAL LOW (ref 8.9–10.3)
Chloride: 99 mmol/L (ref 98–111)
Creatinine, Ser: 6.61 mg/dL — ABNORMAL HIGH (ref 0.61–1.24)
GFR calc Af Amer: 9 mL/min — ABNORMAL LOW (ref >60)
GFR calc non Af Amer: 8 mL/min — ABNORMAL LOW (ref >60)
Glucose, Bld: 177 mg/dL — ABNORMAL HIGH (ref 70–99)
Potassium: 4.4 mmol/L (ref 3.5–5.1)
Sodium: 141 mmol/L (ref 135–145)

## 2019-01-25 LAB — CBC
HCT: 31 % — ABNORMAL LOW (ref 39.0–52.0)
Hemoglobin: 9.5 g/dL — ABNORMAL LOW (ref 13.0–17.0)
MCH: 33.6 pg (ref 26.0–34.0)
MCHC: 30.6 g/dL (ref 30.0–36.0)
MCV: 109.5 fL — ABNORMAL HIGH (ref 80.0–100.0)
Platelets: 185 10*3/uL (ref 150–400)
RBC: 2.83 MIL/uL — ABNORMAL LOW (ref 4.22–5.81)
RDW: 14 % (ref 11.5–15.5)
WBC: 8.8 10*3/uL (ref 4.0–10.5)
nRBC: 0 % (ref 0.0–0.2)

## 2019-01-25 NOTE — ED Provider Notes (Signed)
[facility] Provider Note   CSN: [id number] Arrival date & time: 01/25/19  1320     History   Chief Complaint Chief Complaint  Patient presents with  . Loss of Consciousness    HPI Scott Jones is a 73 y.o. male.     HPI   He presents for evaluation of an episode of syncope, transient shortness of breath and confusion, during treatment of hemodialysis, at home, today.  He describes losing "100 cc of blood," because there was a problem with the dialysis machine.  He is concerned that he has lost other blood, within the last couple of months through various misadventures with his hemodialysis machine.  He gets a medication, every 2 weeks, to build up his blood count.  He states he has never had a blood transfusion.  He states his typical baseline hemoglobin is around 10-11.  He feels better after being transported here, by EMS.  He denies recent illnesses, focal weakness or paresthesia.  He has been taking his usual medicines but did not take any before dialysis today.  No known sick contacts.  There are no other known modifying factors.  Past Medical History:  Diagnosis Date  . Allergy   . Anemia   . Asthma   . Chronic kidney disease   . Depression   . Deviated septum   . Diabetes mellitus   . Hearing aid worn     B/L  . Hepatitis C   . Hypertension   . Infection 03/2018   RIGHT ARM DIALYSIS CATHETER  . Pneumonia   . Wears glasses     Patient Active Problem List   Diagnosis Date Noted  . Osteoporosis 10/26/2018  . Malnutrition of moderate degree 04/05/2018  . ESRD (end stage renal disease) ([place]) 04/03/2018  . Infection of AV graft for dialysis ([place]) 04/03/2018  . Acute gouty arthritis 10/13/2016  . H/O partial nephrectomy 02/16/2016  . Depression (emotion) 12/28/2015  . Allergic rhinitis 12/28/2015  . History of hepatitis C 12/28/2015  . Dyslipidemia 12/28/2015  . Chronic kidney disease (CKD), stage IV (severe) ([place])  12/28/2015  . DM (diabetes mellitus) ([place]) 02/02/2012  . HTN (hypertension) 02/02/2012    Past Surgical History:  Procedure Laterality Date  . AV FISTULA PLACEMENT    . AV FISTULA PLACEMENT Right 09/21/2018   Procedure: ARTERIOVENOUS (AV) FISTULA CREATION RIGHT ARM;  Surgeon: [name], MD;  Location: [facility];  Service: Cardiovascular;  Laterality: Right;  . COLONOSCOPY W/ BIOPSIES AND POLYPECTOMY    . EYE SURGERY    . IR FLUORO GUIDE CV LINE RIGHT  03/18/2018  . IR US GUIDE VASC ACCESS RIGHT  03/18/2018  . NEPHRECTOMY     partial - benign tumor  . PARTIAL NEPHRECTOMY  10/08/2011   Left kidney  . TONSILLECTOMY          Home Medications    Prior to Admission medications   Medication Sig Start Date End Date Taking? Authorizing Provider  allopurinol (ZYLOPRIM) 100 MG tablet Take 1 tablet (100 mg total) by mouth daily. 10/19/18  Yes Copland, Gay Filler, MD  aspirin 81 MG tablet Take 81 mg by mouth daily.   Yes [provider]  atorvastatin (LIPITOR) 40 MG tablet Take 1 tablet (40 mg total) by mouth daily. 04/14/18  Yes Copland, Gay Filler, MD  blood glucose meter kit and supplies Pt requesting ONE TOUCH. Use up to 3 times daily as directed. (FOR ICD-10 E10.9, E11.9).  05/18/18  Yes Copland, Gay Filler, MD  Blood Glucose Monitoring Suppl (ONE TOUCH ULTRA 2) w/Device KIT Use as directed once a day.  E11.9 05/22/18  Yes Copland, Gay Filler, MD  Dextran 70-Hypromellose (ARTIFICIAL TEARS PF OP) Place 1 drop into both eyes at bedtime.   Yes [provider]  docusate sodium (COLACE) 100 MG capsule Take 100 mg by mouth daily as needed for mild constipation.  10/10/11  Yes [provider]  folic acid-vitamin b complex-vitamin c-selenium-zinc (DIALYVITE) 3 MG TABS tablet Take 1 tablet by mouth at bedtime.    Yes [provider]  glucose blood test strip Use as directed once a day.  Dx code E11.9 05/22/18  Yes Copland, Gay Filler, MD  methocarbamol  (ROBAXIN) 500 MG tablet Take 1 tablet (500 mg total) by mouth every 8 (eight) hours as needed for muscle spasms. 10/19/18  Yes Copland, Gay Filler, MD  NIFEdipine (ADALAT CC) 60 MG 24 hr tablet Take 1 tablet (60 mg total) by mouth at bedtime. 10/04/18  Yes Copland, Gay Filler, MD  [facility] DELICA LANCETS 12I MISC 1 each by Does not apply route daily. Dx code E11.9 05/22/18  Yes Copland, Gay Filler, MD  pioglitazone (ACTOS) 30 MG tablet Take 1 tablet (30 mg total) by mouth daily. 07/21/18  Yes Copland, Gay Filler, MD  sertraline (ZOLOFT) 100 MG tablet TAKE 2 TABLETS BY MOUTH ONCE DAILY Patient taking differently: Take 200 mg by mouth daily.  01/30/18  Yes Copland, Gay Filler, MD  sevelamer carbonate (RENVELA) 800 MG tablet Take 1,600 mg by mouth 3 (three) times daily with meals. 1600 MG WITH EVERY MEAL, AND 800 MG WITH EVERY SNACK   Yes [provider]    Family History Family History  Problem Relation Age of Onset  . Diabetes Mother   . Hypertension Mother   . COPD Mother   . Asthma Mother   . Diabetes Father   . Diabetes Sister   . Diabetes Brother   . Kidney disease Brother     Social History Social History   Tobacco Use  . Smoking status: Former Smoker    Packs/day: 1.00    Years: 11.00    Pack years: 11.00    Types: Cigarettes, Pipe    Quit date: 08/07/1974    Years since quitting: 44.4  . Smokeless tobacco: Never Used  Substance Use Topics  . Alcohol use: Yes    Alcohol/week: 1.0 standard drinks    Types: 1 Cans of beer per week    Comment: 1 glass wine weekly  . Drug use: Not Currently    Types: Marijuana    Comment: Marijuana cookie/candy 1x monthly     Allergies   Wellbutrin [bupropion]   Review of Systems Review of Systems  All other systems reviewed and are negative.    Physical Exam Updated Vital Signs BP 129/73   Pulse 73   Temp 98.6 F (37 C) (Oral)   Resp (!) 23   Ht '5\' 5"'$  (1.651 m)   Wt 75.7 kg   SpO2 97%   BMI 27.77 kg/m   Physical  Exam Vitals signs and nursing note reviewed.  Constitutional:      General: He is not in acute distress.    Appearance: He is well-developed. He is not ill-appearing, toxic-appearing or diaphoretic.  HENT:     Head: Normocephalic and atraumatic.     Right Ear: External ear normal.     Left Ear: External ear normal.  Eyes:     Conjunctiva/sclera: Conjunctivae normal.     Pupils: Pupils are equal, round, and reactive to light.  Neck:     Musculoskeletal: Normal range of motion and neck supple.     Trachea: Phonation normal.  Cardiovascular:     Rate and Rhythm: Normal rate and regular rhythm.     Heart sounds: Normal heart sounds.     Comments: Vascular fistula, right upper arm, with normal thrill. Pulmonary:     Effort: Pulmonary effort is normal.     Breath sounds: Normal breath sounds.  Abdominal:     Palpations: Abdomen is soft.     Tenderness: There is no abdominal tenderness.  Musculoskeletal: Normal range of motion.        General: No swelling or tenderness.  Skin:    General: Skin is warm and dry.     Coloration: Skin is not jaundiced or pale.  Neurological:     Mental Status: He is alert and oriented to person, place, and time.     Cranial Nerves: No cranial nerve deficit.     Sensory: No sensory deficit.     Motor: No abnormal muscle tone.     Coordination: Coordination normal.  Psychiatric:        Mood and Affect: Mood normal.        Behavior: Behavior normal.        Thought Content: Thought content normal.        Judgment: Judgment normal.      ED Treatments / Results  Labs (all labs ordered are listed, but only abnormal results are displayed) Labs Reviewed  BASIC METABOLIC PANEL - Abnormal; Notable for the following components:      Result Value   Glucose, Bld 177 (*)    BUN 55 (*)    Creatinine, Ser 6.61 (*)    Calcium 8.6 (*)    GFR calc non Af Amer 8 (*)    GFR calc Af Amer 9 (*)    All other components within normal limits  CBC - Abnormal;  Notable for the following components:   RBC 2.83 (*)    Hemoglobin 9.5 (*)    HCT 31.0 (*)    MCV 109.5 (*)    All other components within normal limits    EKG EKG Interpretation  Date/Time:  Thursday January 25 2019 13:26:26 EDT Ventricular Rate:  78 PR Interval:    QRS Duration: 143 QT Interval:  430 QTC Calculation: 490 R Axis:   -25 Text Interpretation:  Sinus rhythm Left bundle branch block Since last tracing Left bundle branch block is new Confirmed by Daleen Bo [phone]) on 01/25/2019 1:42:50 PM   Radiology No results found.  Procedures Procedures (including critical care time)  Medications Ordered in ED Medications - No data to display   Initial Impression / Assessment and Plan / ED Course  I have reviewed the triage vital signs and the nursing notes.  Pertinent labs & imaging results that were available during my care of the patient were reviewed by me and considered in my medical decision making (see chart for details).  Clinical Course as of Jan 25 1640  Thu Jan 25, 2019  1435 Normal except elevated glucose, BUN, creatinine; and low calcium and GFR.  Basic metabolic panel(!) [EW]  9470 Normal except hemoglobin, MCV high  CBC(!) [EW]  1507 Case discussed with on-call nephrology physician.  Dr. Johnney Ou, states that she will have the home dialysis navigator service contact the patient  for instructions and treatment going forward.  She suspects that he will have to have extra fluid taken off and his next dialysis treatment.  She does not want to intervene, currently on his anemia level.   [EW]    Clinical Course User Index [EW] Daleen Bo, MD        Patient Vitals for the past 24 hrs:  BP Temp Temp src Pulse Resp SpO2 Height Weight  01/25/19 1530 129/73 - - 73 (!) 23 97 % - -  01/25/19 1500 (!) 143/69 - - 74 (!) 23 97 % - -  01/25/19 1430 123/65 - - 78 18 97 % - -  01/25/19 1400 131/73 - - 73 (!) 24 96 % - -  01/25/19 1330 131/68 - - 77 18 95 % -  -  01/25/19 1329 - - - - - - '5\' 5"'$  (1.651 m) 75.7 kg  01/25/19 1327 130/69 98.6 F (37 C) Oral 78 (!) 22 96 % - -    4:41 PM Reevaluation with update and discussion. After initial assessment and treatment, an updated evaluation reveals he is comfortable has no further complaints.  Patient's wife now here and we discussed the findings and plan.  She and he are agreeable. Daleen Bo   Medical Decision Making: End-stage renal disease with complication during dialysis, leading to a vasovagal episode.  Patient's recovered spontaneously.  Doubt serious bacterial infection, significant fluid overload, metabolic instability or impending vascular collapse.  Baseline anemia, with possible slight lowering as compared to prior.  No indication for transfusion or hospitalization at this time.  CRITICAL CARE-no Performed by: Daleen Bo  Nursing Notes Reviewed/ Care Coordinated Applicable Imaging Reviewed Interpretation of Laboratory Data incorporated into ED treatment  The patient appears reasonably screened and/or stabilized for discharge and I doubt any other medical condition or other [facility] requiring further screening, evaluation, or treatment in the ED at this time prior to discharge.  Plan: Home Medications-continue usual; Home Treatments-rest, fluids; return here if the recommended treatment, does not improve the symptoms; Recommended follow up-nephrology, PRN.    Final Clinical Impressions(s) / ED Diagnoses   Final diagnoses:  Syncope, unspecified syncope type  End stage renal disease ([place])  Anemia, unspecified type    ED Discharge Orders    None       Daleen Bo, MD 01/25/19 1643

## 2019-01-25 NOTE — ED Notes (Signed)
Wife updated on phone

## 2019-01-25 NOTE — Progress Notes (Signed)
Brief note for documentation purposes.  I did not personally evaluate the patient.  Pt presented to ED today after home dialysis reportedly administered ~1L additional saline and he lost 122mL blood.    His labs today show Hb 9.5g/dL.  Last check at dialysis clinic was 8/20 - 9.2g/dL.  He has no clinical signs of volume overload per ED.    Spoke with home dialysis RN who is aware of the case -- she described in detail the issue that led to the additional saline (estimated to be less than 1L) and blood loss today -- from what I gather this was user error.  There are already arrangements in place for assistance in training to be present at the [facility] home for his dialysis treatment tomorrow.  The additional fluid he received today will be removed at that dialysis treatment.    He can be discharged home from the ED from a nephrology perspective assuming no clinical changes.

## 2019-01-25 NOTE — Discharge Instructions (Addendum)
The renal service will contact you to help you with adjustments for his next dialysis treatment, tomorrow.

## 2019-01-25 NOTE — ED Notes (Signed)
Pt verbalized understanding of discharge paperwork and follow-up care.  °

## 2019-01-25 NOTE — ED Triage Notes (Signed)
Pt arrives via EMS from home with reports of doing home HD with home RN. Pt had complications with HD today where no fluid was taken off and pt received 1L of fluid. Pt had LOC and vomited. Pt states he feels like his hemoglobin is low. Denies falling.

## 2019-01-31 DIAGNOSIS — N186 End stage renal disease: Secondary | ICD-10-CM | POA: Diagnosis not present

## 2019-01-31 DIAGNOSIS — Z87891 Personal history of nicotine dependence: Secondary | ICD-10-CM | POA: Diagnosis not present

## 2019-01-31 DIAGNOSIS — R0609 Other forms of dyspnea: Secondary | ICD-10-CM | POA: Diagnosis not present

## 2019-01-31 DIAGNOSIS — Z01818 Encounter for other preprocedural examination: Secondary | ICD-10-CM | POA: Diagnosis not present

## 2019-02-08 ENCOUNTER — Other Ambulatory Visit: Payer: Self-pay

## 2019-02-08 MED ORDER — DENOSUMAB 60 MG/ML ~~LOC~~ SOSY: 60.0000 mg | PREFILLED_SYRINGE | Freq: Once | SUBCUTANEOUS | 0 refills | Status: AC

## 2019-02-14 DIAGNOSIS — E1122 Type 2 diabetes mellitus with diabetic chronic kidney disease: Secondary | ICD-10-CM | POA: Diagnosis not present

## 2019-02-14 DIAGNOSIS — Z7984 Long term (current) use of oral hypoglycemic drugs: Secondary | ICD-10-CM | POA: Diagnosis not present

## 2019-02-14 DIAGNOSIS — I351 Nonrheumatic aortic (valve) insufficiency: Secondary | ICD-10-CM | POA: Diagnosis not present

## 2019-02-14 DIAGNOSIS — Z992 Dependence on renal dialysis: Secondary | ICD-10-CM | POA: Diagnosis not present

## 2019-02-14 DIAGNOSIS — I340 Nonrheumatic mitral (valve) insufficiency: Secondary | ICD-10-CM | POA: Diagnosis not present

## 2019-02-14 DIAGNOSIS — I493 Ventricular premature depolarization: Secondary | ICD-10-CM | POA: Diagnosis not present

## 2019-02-14 DIAGNOSIS — I517 Cardiomegaly: Secondary | ICD-10-CM | POA: Diagnosis not present

## 2019-02-14 DIAGNOSIS — N186 End stage renal disease: Secondary | ICD-10-CM | POA: Diagnosis not present

## 2019-02-14 DIAGNOSIS — I361 Nonrheumatic tricuspid (valve) insufficiency: Secondary | ICD-10-CM | POA: Diagnosis not present

## 2019-02-14 DIAGNOSIS — I447 Left bundle-branch block, unspecified: Secondary | ICD-10-CM | POA: Diagnosis not present

## 2019-02-14 DIAGNOSIS — Z01818 Encounter for other preprocedural examination: Secondary | ICD-10-CM | POA: Diagnosis not present

## 2019-02-14 DIAGNOSIS — I120 Hypertensive chronic kidney disease with stage 5 chronic kidney disease or end stage renal disease: Secondary | ICD-10-CM | POA: Diagnosis not present

## 2019-02-15 ENCOUNTER — Encounter (HOSPITAL_COMMUNITY): Payer: Self-pay | Admitting: Emergency Medicine

## 2019-02-15 ENCOUNTER — Ambulatory Visit (HOSPITAL_COMMUNITY)
Admission: EM | Admit: 2019-02-15 | Discharge: 2019-02-15 | Disposition: A | Discharge status: Home / Self Care | Payer: PPO | Attending: Family Medicine | Admitting: Family Medicine

## 2019-02-15 ENCOUNTER — Other Ambulatory Visit: Payer: Self-pay

## 2019-02-15 DIAGNOSIS — M7632 Iliotibial band syndrome, left leg: Secondary | ICD-10-CM

## 2019-02-15 DIAGNOSIS — M62838 Other muscle spasm: Secondary | ICD-10-CM

## 2019-02-15 MED ORDER — MELOXICAM 7.5 MG PO TABS: 7.5000 mg | ORAL_TABLET | Freq: Every day | ORAL | 0 refills | Status: DC

## 2019-02-15 MED ORDER — METHOCARBAMOL 500 MG PO TABS: 500.0000 mg | ORAL_TABLET | Freq: Three times a day (TID) | ORAL | 1 refills | Status: DC | PRN

## 2019-02-15 NOTE — ED Triage Notes (Addendum)
Pt states hes had a sore upper leg for a few days, pt states it feels like a pulled muscle. Pt states he has [facility]. Pt states he had a problem with the machine on Tuesday and had HD on Wednesday, and will do it again today. No issues with HD on Wednesday. Pt is on robaxin already and it is not helping. Pt takes meloxicam and tylenol helps with the pain somewhat.

## 2019-02-15 NOTE — ED Provider Notes (Signed)
[facility]    CSN: [id number] Arrival date & time: 02/15/19  4696      History   Chief Complaint Chief Complaint  Patient presents with  . Leg Pain    HPI Scott Jones is a 73 y.o. male.   Is a 73 year old male past medical history of allergy, anemia, asthma, chronic kidney disease, depression, diabetes, hep C, hypertension, pneumonia.  He does home hemodialysis.  Has dialysis catheter in the right arm.  Last dialysis was yesterday.  There was no issues with this.  He is here today for approximately 4 days of left upper lateral leg discomfort.  Describes as a soreness and stretching.  Symptoms have been constant, waxing and waning.  He has been taking meloxicam, Tylenol and Robaxin with some relief.  He did have some trouble sleeping last night due to positioning.  Denies any injury to the leg, falls.  No swelling.  He has been doing some lifting of boxes and walking up and on the stairs which is more than he is used to doing recently.  He did fall and had a knee injury a few months back.  He had x-ray done with no acute fractures at that time.  He did not have upper leg pain at that time.  He is also been doing some stretching.  No personal history of DVT or PE.  No PAD.  No trouble ambulating on the leg.  Good range of motion. No hip pain.   ROS per HPI    Leg Pain   Past Medical History:  Diagnosis Date  . Allergy   . Anemia   . Asthma   . Chronic kidney disease   . Depression   . Deviated septum   . Diabetes mellitus   . Hearing aid worn     B/L  . Hepatitis C   . Hypertension   . Infection 03/2018   RIGHT ARM DIALYSIS CATHETER  . Pneumonia   . Wears glasses     Patient Active Problem List   Diagnosis Date Noted  . Osteoporosis 10/26/2018  . Malnutrition of moderate degree 04/05/2018  . ESRD (end stage renal disease) ([place]) 04/03/2018  . Infection of AV graft for dialysis ([place]) 04/03/2018  . Acute gouty arthritis 10/13/2016  . H/O partial  nephrectomy 02/16/2016  . Depression (emotion) 12/28/2015  . Allergic rhinitis 12/28/2015  . History of hepatitis C 12/28/2015  . Dyslipidemia 12/28/2015  . Chronic kidney disease (CKD), stage IV (severe) ([place]) 12/28/2015  . DM (diabetes mellitus) ([place]) 02/02/2012  . HTN (hypertension) 02/02/2012    Past Surgical History:  Procedure Laterality Date  . AV FISTULA PLACEMENT    . AV FISTULA PLACEMENT Right 09/21/2018   Procedure: ARTERIOVENOUS (AV) FISTULA CREATION RIGHT ARM;  Surgeon: [name], MD;  Location: [facility];  Service: Cardiovascular;  Laterality: Right;  . COLONOSCOPY W/ BIOPSIES AND POLYPECTOMY    . EYE SURGERY    . IR FLUORO GUIDE CV LINE RIGHT  03/18/2018  . IR US GUIDE VASC ACCESS RIGHT  03/18/2018  . NEPHRECTOMY     partial - benign tumor  . PARTIAL NEPHRECTOMY  10/08/2011   Left kidney  . TONSILLECTOMY         Home Medications    Prior to Admission medications   Medication Sig Start Date End Date Taking? Authorizing Provider  allopurinol (ZYLOPRIM) 100 MG tablet Take 1 tablet (100 mg total) by mouth daily. 10/19/18   Copland, Gay Filler,  MD  aspirin 81 MG tablet Take 81 mg by mouth daily.    [provider]  atorvastatin (LIPITOR) 40 MG tablet Take 1 tablet (40 mg total) by mouth daily. 04/14/18   Copland, Gay Filler, MD  blood glucose meter kit and supplies Pt requesting ONE TOUCH. Use up to 3 times daily as directed. (FOR ICD-10 E10.9, E11.9). 05/18/18   Copland, Gay Filler, MD  Blood Glucose Monitoring Suppl (ONE TOUCH ULTRA 2) w/Device KIT Use as directed once a day.  E11.9 05/22/18   Copland, Gay Filler, MD  Dextran 70-Hypromellose (ARTIFICIAL TEARS PF OP) Place 1 drop into both eyes at bedtime.    [provider]  docusate sodium (COLACE) 100 MG capsule Take 100 mg by mouth daily as needed for mild constipation.  10/10/11   [provider]  folic acid-vitamin b complex-vitamin c-selenium-zinc (DIALYVITE) 3 MG TABS tablet  Take 1 tablet by mouth at bedtime.     [provider]  glucose blood test strip Use as directed once a day.  Dx code E11.9 05/22/18   Copland, Gay Filler, MD  meloxicam (MOBIC) 7.5 MG tablet Take 1 tablet (7.5 mg total) by mouth daily. 02/15/19   Loura Halt A, NP  methocarbamol (ROBAXIN) 500 MG tablet Take 1 tablet (500 mg total) by mouth every 8 (eight) hours as needed for muscle spasms. 02/15/19   Loura Halt A, NP  NIFEdipine (ADALAT CC) 60 MG 24 hr tablet Take 1 tablet (60 mg total) by mouth at bedtime. 10/04/18   Copland, Gay Filler, MD  [facility] DELICA LANCETS 18E MISC 1 each by Does not apply route daily. Dx code E11.9 05/22/18   Copland, Gay Filler, MD  pioglitazone (ACTOS) 30 MG tablet Take 1 tablet (30 mg total) by mouth daily. 07/21/18   Copland, Gay Filler, MD  sertraline (ZOLOFT) 100 MG tablet TAKE 2 TABLETS BY MOUTH ONCE DAILY Patient taking differently: Take 200 mg by mouth daily.  01/30/18   Copland, Gay Filler, MD  sevelamer carbonate (RENVELA) 800 MG tablet Take 1,600 mg by mouth 3 (three) times daily with meals. 1600 MG WITH EVERY MEAL, AND 800 MG WITH EVERY SNACK    [provider]    Family History Family History  Problem Relation Age of Onset  . Diabetes Mother   . Hypertension Mother   . COPD Mother   . Asthma Mother   . Diabetes Father   . Diabetes Sister   . Diabetes Brother   . Kidney disease Brother     Social History Social History   Tobacco Use  . Smoking status: Former Smoker    Packs/day: 1.00    Years: 11.00    Pack years: 11.00    Types: Cigarettes, Pipe    Quit date: 08/07/1974    Years since quitting: 44.5  . Smokeless tobacco: Never Used  Substance Use Topics  . Alcohol use: Yes    Alcohol/week: 1.0 standard drinks    Types: 1 Cans of beer per week    Comment: 1 glass wine weekly  . Drug use: Not Currently    Types: Marijuana    Comment: Marijuana cookie/candy 1x monthly     Allergies   Wellbutrin [bupropion]   Review of  Systems Review of Systems   Physical Exam Triage Vital Signs ED Triage Vitals  Enc Vitals Group     BP 02/15/19 0926 (!) 157/70     Pulse Rate 02/15/19 0926 64     Resp 02/15/19  0926 18     Temp 02/15/19 0926 (!) 97.3 F (36.3 C)     Temp Source 02/15/19 0926 Tympanic     SpO2 02/15/19 0926 100 %     Weight --      Height --      Head Circumference --      Peak Flow --      Pain Score 02/15/19 0931 6     Pain Loc --      Pain Edu? --      Excl. in [place]? --    No data found.  Updated Vital Signs BP (!) 157/70 (BP Location: Left Arm)   Pulse 64   Temp (!) 97.3 F (36.3 C) (Tympanic)   Resp 18   SpO2 100%   Visual Acuity Right Eye Distance:   Left Eye Distance:   Bilateral Distance:    Right Eye Near:   Left Eye Near:    Bilateral Near:     Physical Exam Vitals signs and nursing note reviewed.  Constitutional:      General: He is not in acute distress.    Appearance: Normal appearance. He is not ill-appearing, toxic-appearing or diaphoretic.  HENT:     Head: Normocephalic and atraumatic.     Nose: Nose normal.  Eyes:     Conjunctiva/sclera: Conjunctivae normal.  Neck:     Musculoskeletal: Normal range of motion.  Cardiovascular:     Rate and Rhythm: Normal rate.  Pulmonary:     Effort: Pulmonary effort is normal.  Musculoskeletal: Normal range of motion.     Left upper leg: He exhibits tenderness. He exhibits no bony tenderness, no swelling, no edema, no deformity and no laceration.       Legs:     Comments: Normal size and color to upper leg.  Tenderness to palpation of the left lateral IT band.  No swelling, bruising or deformities.  Good pedal pulse.  No calf pain, swelling or tenderness.  No posterior knee pain mild pain to lateral left knee.  No swelling to the knee.  Skin:    General: Skin is warm and dry.     Coloration: Skin is not jaundiced or pale.     Findings: No bruising, erythema, lesion or rash.  Neurological:     Mental Status: He is  alert.  Psychiatric:        Mood and Affect: Mood normal.      UC Treatments / Results  Labs (all labs ordered are listed, but only abnormal results are displayed) Labs Reviewed - No data to display  EKG   Radiology No results found.  Procedures Procedures (including critical care time)  Medications Ordered in UC Medications - No data to display  Initial Impression / Assessment and Plan / UC Course  I have reviewed the triage vital signs and the nursing notes.  Pertinent labs & imaging results that were available during my care of the patient were reviewed by me and considered in my medical decision making (see chart for details).     IT band syndrome.  This is most likely diagnosis based on symptoms and location of discomfort.  We will have him continue the meloxicam and Robaxin he was already prescribed.  Printed out stretches to do to help.  Recommended rest, ice and elevate the leg.  Strict return precautions that if symptoms worsen to include more severe pain, swelling he will need to follow-up. Final Clinical Impressions(s) / UC Diagnoses   Final  diagnoses:  It band syndrome, left     Discharge Instructions     I believe this is IT band syndrome.  I have printed out some information about this and some exercises you can do to stretch.  You can continue the Robaxin and meloxicam as needed.  I will refill this for you. You can take up to 15 mg of the meloxicam if needed Rest, ice the area a few times a day for 10 minutes at a time. If your symptoms worsen or you start developing other symptoms and severe swelling, redness or trouble walking you will need to come back in for recheck.    ED Prescriptions    Medication Sig Dispense Auth. Provider   methocarbamol (ROBAXIN) 500 MG tablet Take 1 tablet (500 mg total) by mouth every 8 (eight) hours as needed for muscle spasms. 60 tablet Perl Kerney A, NP   meloxicam (MOBIC) 7.5 MG tablet Take 1 tablet (7.5 mg total) by  mouth daily. 30 tablet Loura Halt A, NP     PDMP not reviewed this encounter.   Loura Halt A, NP 02/15/19 1045

## 2019-02-15 NOTE — Discharge Instructions (Signed)
I believe this is IT band syndrome.  I have printed out some information about this and some exercises you can do to stretch.  You can continue the Robaxin and meloxicam as needed.  I will refill this for you. You can take up to 15 mg of the meloxicam if needed Rest, ice the area a few times a day for 10 minutes at a time. If your symptoms worsen or you start developing other symptoms and severe swelling, redness or trouble walking you will need to come back in for recheck.

## 2019-03-05 ENCOUNTER — Other Ambulatory Visit: Payer: Self-pay | Admitting: Family Medicine

## 2019-03-05 DIAGNOSIS — M62838 Other muscle spasm: Secondary | ICD-10-CM

## 2019-03-05 MED ORDER — METHOCARBAMOL 500 MG PO TABS: 500.0000 mg | ORAL_TABLET | Freq: Three times a day (TID) | ORAL | 3 refills | Status: DC | PRN

## 2019-03-05 NOTE — Telephone Encounter (Signed)
Medication Refill - Medication:methocarbamol (ROBAXIN) 500 MG tablet      Preferred Pharmacy (with phone number or street name):  [place], [place] [address]  Phone: [phone] Fax: [phone]     Agent: Please be advised that RX refills may take up to 3 business days. We ask that you follow-up with your pharmacy.

## 2019-03-05 NOTE — Telephone Encounter (Signed)
Requested medication (s) are due for refill today:yes  Requested medication (s) are on the active medication list: yes  Last refill:  02/15/2019  Future visit scheduled: no  Notes to clinic:  Refill cannot be delegated    Requested Prescriptions  Pending Prescriptions Disp Refills   methocarbamol (ROBAXIN) 500 MG tablet 60 tablet 1    Sig: Take 1 tablet (500 mg total) by mouth every 8 (eight) hours as needed for muscle spasms.     Not Delegated - Analgesics:  Muscle Relaxants Failed - 03/05/2019  2:31 PM      Failed - This refill cannot be delegated      Passed - Valid encounter within last 6 months    Recent Outpatient Visits          3 months ago Acute pain of left knee   [facility] at [facility], [place], [place]   4 months ago Gout due to renal impairment, unspecified chronicity, unspecified site   [facility] at [place], [place]   5 months ago Muscle spasm   [facility] at [facility], [place], [place]   1 year ago [facility] at [place], [place]   1 year ago Olecranon bursitis of right elbow   [facility] at [facility], Gay [place], [place]

## 2019-03-06 ENCOUNTER — Encounter: Payer: Self-pay | Admitting: Family Medicine

## 2019-03-06 ENCOUNTER — Other Ambulatory Visit: Payer: Self-pay | Admitting: Family Medicine

## 2019-03-06 DIAGNOSIS — E1122 Type 2 diabetes mellitus with diabetic chronic kidney disease: Secondary | ICD-10-CM

## 2019-03-06 DIAGNOSIS — N184 Chronic kidney disease, stage 4 (severe): Secondary | ICD-10-CM

## 2019-03-06 MED ORDER — MELOXICAM 7.5 MG PO TABS: 7.5000 mg | ORAL_TABLET | Freq: Every day | ORAL | 3 refills | Status: DC

## 2019-03-06 NOTE — Telephone Encounter (Signed)
Medication Refill - Medication: meloxicam (MOBIC) 7.5 MG tablet  Has the patient contacted their pharmacy? yes (Agent: If no, request that the patient contact the pharmacy for the refill.) (Agent: If yes, when and what did the pharmacy advise?)  Preferred Pharmacy (with phone number or street name):     [facility], [place] [phone] (Phone) [phone] (Fax)   Agent: Please be advised that RX refills may take up to 3 business days. We ask that you follow-up with your pharmacy.

## 2019-03-06 NOTE — Telephone Encounter (Signed)
Requested medication (s) are due for refill today: yes  Requested medication (s) are on the active medication list: yes  Last refill:  02/15/2019  Future visit scheduled:no  Notes to clinic:  Review for refill   Requested Prescriptions  Pending Prescriptions Disp Refills   meloxicam (MOBIC) 7.5 MG tablet 30 tablet 0    Sig: Take 1 tablet (7.5 mg total) by mouth daily.     Analgesics:  COX2 Inhibitors Failed - 03/06/2019  9:23 AM      Failed - HGB in normal range and within 360 days    Hemoglobin  Date Value Ref Range Status  01/25/2019 9.5 (L) 13.0 - 17.0 g/dL Final         Failed - Cr in normal range and within 360 days    Creat  Date Value Ref Range Status  10/05/2013 2.11 (H) 0.50 - 1.35 mg/dL Final   Creatinine, Ser  Date Value Ref Range Status  01/25/2019 6.61 (H) 0.61 - 1.24 mg/dL Final         Passed - Patient is not pregnant      Passed - Valid encounter within last 12 months    Recent Outpatient Visits          3 months ago Acute pain of left knee   [facility] at [facility], [place], [place]   4 months ago Gout due to renal impairment, unspecified chronicity, unspecified site   [facility] at [place], [place]   5 months ago Muscle spasm   [facility] at [place], [place]   1 year ago [facility] at [place], [place]   1 year ago Olecranon bursitis of right elbow   [facility] at [facility], Gay F[place], [place]

## 2019-04-16 DIAGNOSIS — I120 Hypertensive chronic kidney disease with stage 5 chronic kidney disease or end stage renal disease: Secondary | ICD-10-CM | POA: Diagnosis not present

## 2019-04-16 DIAGNOSIS — I272 Pulmonary hypertension, unspecified: Secondary | ICD-10-CM | POA: Diagnosis not present

## 2019-04-16 DIAGNOSIS — N186 End stage renal disease: Secondary | ICD-10-CM | POA: Diagnosis not present

## 2019-04-16 DIAGNOSIS — J449 Chronic obstructive pulmonary disease, unspecified: Secondary | ICD-10-CM | POA: Diagnosis not present

## 2019-04-16 DIAGNOSIS — Z87891 Personal history of nicotine dependence: Secondary | ICD-10-CM | POA: Diagnosis not present

## 2019-04-16 DIAGNOSIS — Z992 Dependence on renal dialysis: Secondary | ICD-10-CM | POA: Diagnosis not present

## 2019-04-16 DIAGNOSIS — I2729 Other secondary pulmonary hypertension: Secondary | ICD-10-CM | POA: Diagnosis not present

## 2019-04-16 DIAGNOSIS — E1122 Type 2 diabetes mellitus with diabetic chronic kidney disease: Secondary | ICD-10-CM | POA: Diagnosis not present

## 2019-05-07 DIAGNOSIS — Z20828 Contact with and (suspected) exposure to other viral communicable diseases: Secondary | ICD-10-CM | POA: Diagnosis not present

## 2019-05-14 ENCOUNTER — Encounter (HOSPITAL_COMMUNITY): Payer: Self-pay | Admitting: *Deleted

## 2019-05-14 DIAGNOSIS — F17210 Nicotine dependence, cigarettes, uncomplicated: Secondary | ICD-10-CM | POA: Diagnosis not present

## 2019-05-14 DIAGNOSIS — J449 Chronic obstructive pulmonary disease, unspecified: Secondary | ICD-10-CM | POA: Diagnosis not present

## 2019-05-14 DIAGNOSIS — R0689 Other abnormalities of breathing: Secondary | ICD-10-CM | POA: Diagnosis not present

## 2019-05-14 NOTE — Progress Notes (Signed)
Received referral from Dr. Jasmine Awe at [facility] for this pt to participate in Pulmonary rehab with the diagnosis of Pulmonary Hypertension and Asthma.  Pt does home dialysis for ESRD.  Pt is undergoing work up for kidney transplant Clinical review of pt follow up appt on 1221 for Walk for [place] and oxygen Testing during exertion and 11/23 telemedicine Pulmonary office note.  Pt with Covid Risk Score - 6. P Will forward to support staff for scheduling and verification of insurance eligibility/benefits with pt consent. Md referral order sent for signature as well as notification that Pulmonary rehab will by virtual for 30-60 days per senior leadership here at [facility].  Once we receive the signed MD order okay to forward to pulmonary rehab staff for contact and scheduling for Initial Assessment.  Cherre Huger, BSN Cardiac and Training and development officer

## 2019-05-16 ENCOUNTER — Other Ambulatory Visit: Payer: Self-pay | Admitting: Family Medicine

## 2019-05-16 DIAGNOSIS — F4323 Adjustment disorder with mixed anxiety and depressed mood: Secondary | ICD-10-CM

## 2019-06-01 ENCOUNTER — Encounter (HOSPITAL_COMMUNITY): Payer: Self-pay | Admitting: Emergency Medicine

## 2019-06-01 ENCOUNTER — Emergency Department (HOSPITAL_COMMUNITY)
Admission: EM | Admit: 2019-06-01 | Discharge: 2019-06-05 | Disposition: A | Discharge status: Other Acute Inpatient Hospital | Payer: No Typology Code available for payment source | Attending: Emergency Medicine | Admitting: Emergency Medicine

## 2019-06-01 DIAGNOSIS — Z046 Encounter for general psychiatric examination, requested by authority: Secondary | ICD-10-CM | POA: Insufficient documentation

## 2019-06-01 DIAGNOSIS — N186 End stage renal disease: Secondary | ICD-10-CM | POA: Insufficient documentation

## 2019-06-01 DIAGNOSIS — Z20822 Contact with and (suspected) exposure to covid-19: Secondary | ICD-10-CM | POA: Insufficient documentation

## 2019-06-01 DIAGNOSIS — F29 Unspecified psychosis not due to a substance or known physiological condition: Secondary | ICD-10-CM | POA: Diagnosis not present

## 2019-06-01 DIAGNOSIS — Z87891 Personal history of nicotine dependence: Secondary | ICD-10-CM | POA: Diagnosis not present

## 2019-06-01 DIAGNOSIS — E1122 Type 2 diabetes mellitus with diabetic chronic kidney disease: Secondary | ICD-10-CM | POA: Diagnosis not present

## 2019-06-01 DIAGNOSIS — R440 Auditory hallucinations: Secondary | ICD-10-CM | POA: Diagnosis not present

## 2019-06-01 DIAGNOSIS — Z992 Dependence on renal dialysis: Secondary | ICD-10-CM | POA: Insufficient documentation

## 2019-06-01 DIAGNOSIS — Z79899 Other long term (current) drug therapy: Secondary | ICD-10-CM | POA: Diagnosis not present

## 2019-06-01 DIAGNOSIS — R451 Restlessness and agitation: Secondary | ICD-10-CM | POA: Diagnosis present

## 2019-06-01 DIAGNOSIS — F23 Brief psychotic disorder: Secondary | ICD-10-CM | POA: Diagnosis not present

## 2019-06-01 DIAGNOSIS — J45909 Unspecified asthma, uncomplicated: Secondary | ICD-10-CM | POA: Diagnosis not present

## 2019-06-01 DIAGNOSIS — Z7982 Long term (current) use of aspirin: Secondary | ICD-10-CM | POA: Diagnosis not present

## 2019-06-01 DIAGNOSIS — R455 Hostility: Secondary | ICD-10-CM | POA: Diagnosis not present

## 2019-06-01 DIAGNOSIS — I120 Hypertensive chronic kidney disease with stage 5 chronic kidney disease or end stage renal disease: Secondary | ICD-10-CM | POA: Insufficient documentation

## 2019-06-01 DIAGNOSIS — Z03818 Encounter for observation for suspected exposure to other biological agents ruled out: Secondary | ICD-10-CM | POA: Diagnosis not present

## 2019-06-01 LAB — CBC WITH DIFFERENTIAL/PLATELET
Abs Immature Granulocytes: 0.03 10*3/uL (ref 0.00–0.07)
Basophils Absolute: 0 10*3/uL (ref 0.0–0.1)
Basophils Relative: 0 %
Eosinophils Absolute: 0.1 10*3/uL (ref 0.0–0.5)
Eosinophils Relative: 2 %
HCT: 42.6 % (ref 39.0–52.0)
Hemoglobin: 13.2 g/dL (ref 13.0–17.0)
Immature Granulocytes: 0 %
Lymphocytes Relative: 9 %
Lymphs Abs: 0.8 10*3/uL (ref 0.7–4.0)
MCH: 33.5 pg (ref 26.0–34.0)
MCHC: 31 g/dL (ref 30.0–36.0)
MCV: 108.1 fL — ABNORMAL HIGH (ref 80.0–100.0)
Monocytes Absolute: 0.5 10*3/uL (ref 0.1–1.0)
Monocytes Relative: 7 %
Neutro Abs: 6.7 10*3/uL (ref 1.7–7.7)
Neutrophils Relative %: 82 %
Platelets: 158 10*3/uL (ref 150–400)
RBC: 3.94 MIL/uL — ABNORMAL LOW (ref 4.22–5.81)
RDW: 14.2 % (ref 11.5–15.5)
WBC: 8.1 10*3/uL (ref 4.0–10.5)
nRBC: 0 % (ref 0.0–0.2)

## 2019-06-01 LAB — COMPREHENSIVE METABOLIC PANEL
ALT: 19 U/L (ref 0–44)
AST: 25 U/L (ref 15–41)
Albumin: 4.5 g/dL (ref 3.5–5.0)
Alkaline Phosphatase: 56 U/L (ref 38–126)
Anion gap: 16 — ABNORMAL HIGH (ref 5–15)
BUN: 67 mg/dL — ABNORMAL HIGH (ref 8–23)
CO2: 25 mmol/L (ref 22–32)
Calcium: 9.1 mg/dL (ref 8.9–10.3)
Chloride: 99 mmol/L (ref 98–111)
Creatinine, Ser: 7.16 mg/dL — ABNORMAL HIGH (ref 0.61–1.24)
GFR calc Af Amer: 8 mL/min — ABNORMAL LOW (ref >60)
GFR calc non Af Amer: 7 mL/min — ABNORMAL LOW (ref >60)
Glucose, Bld: 106 mg/dL — ABNORMAL HIGH (ref 70–99)
Potassium: 4.7 mmol/L (ref 3.5–5.1)
Sodium: 140 mmol/L (ref 135–145)
Total Bilirubin: 0.6 mg/dL (ref 0.3–1.2)
Total Protein: 8.4 g/dL — ABNORMAL HIGH (ref 6.5–8.1)

## 2019-06-01 LAB — ETHANOL: Alcohol, Ethyl (B): <10 mg/dL (ref <10)

## 2019-06-01 MED ORDER — SEVELAMER CARBONATE 800 MG PO TABS
1600.0000 mg | ORAL_TABLET | Freq: Three times a day (TID) | ORAL | Status: DC
  Administered 2019-06-02 (×2): 1600 mg via ORAL
  Filled 2019-06-01 (×4): qty 2

## 2019-06-01 MED ORDER — ATORVASTATIN CALCIUM 40 MG PO TABS
40.0000 mg | ORAL_TABLET | Freq: Every day | ORAL | Status: DC
  Administered 2019-06-02 – 2019-06-05 (×4): 40 mg via ORAL
  Filled 2019-06-01 (×4): qty 1

## 2019-06-01 MED ORDER — ASPIRIN EC 81 MG PO TBEC
81.0000 mg | DELAYED_RELEASE_TABLET | Freq: Every day | ORAL | Status: DC
  Administered 2019-06-02 – 2019-06-05 (×4): 81 mg via ORAL
  Filled 2019-06-01 (×4): qty 1

## 2019-06-01 MED ORDER — INSULIN ASPART 100 UNIT/ML ~~LOC~~ SOLN
0.0000 [IU] | Freq: Every day | SUBCUTANEOUS | Status: DC
  Filled 2019-06-01: qty 0.05

## 2019-06-01 MED ORDER — NICOTINE 21 MG/24HR TD PT24
21.0000 mg | MEDICATED_PATCH | Freq: Every day | TRANSDERMAL | Status: DC
  Filled 2019-06-01 (×3): qty 1

## 2019-06-01 MED ORDER — ZOLPIDEM TARTRATE 5 MG PO TABS: 5.0000 mg | ORAL_TABLET | Freq: Every evening | ORAL | Status: DC | PRN

## 2019-06-01 MED ORDER — LORAZEPAM 1 MG PO TABS
1.0000 mg | ORAL_TABLET | ORAL | Status: AC | PRN
  Administered 2019-06-04: 1 mg via ORAL
  Filled 2019-06-01: qty 1

## 2019-06-01 MED ORDER — NIFEDIPINE ER OSMOTIC RELEASE 60 MG PO TB24
60.0000 mg | ORAL_TABLET | Freq: Every day | ORAL | Status: DC
  Administered 2019-06-02 – 2019-06-04 (×3): 60 mg via ORAL
  Filled 2019-06-01 (×5): qty 1

## 2019-06-01 MED ORDER — INSULIN ASPART 100 UNIT/ML ~~LOC~~ SOLN
0.0000 [IU] | Freq: Three times a day (TID) | SUBCUTANEOUS | Status: DC
  Administered 2019-06-02: 5 [IU] via SUBCUTANEOUS
  Administered 2019-06-02: 3 [IU] via SUBCUTANEOUS
  Filled 2019-06-01: qty 0.15

## 2019-06-01 MED ORDER — ONDANSETRON HCL 4 MG PO TABS: 4.0000 mg | ORAL_TABLET | Freq: Three times a day (TID) | ORAL | Status: DC | PRN

## 2019-06-01 MED ORDER — RISPERIDONE 1 MG PO TBDP: 2.0000 mg | ORAL_TABLET | Freq: Three times a day (TID) | ORAL | Status: DC | PRN

## 2019-06-01 MED ORDER — LORAZEPAM 1 MG PO TABS: 1.0000 mg | ORAL_TABLET | ORAL | Status: DC | PRN

## 2019-06-01 MED ORDER — ACETAMINOPHEN 325 MG PO TABS
650.0000 mg | ORAL_TABLET | ORAL | Status: DC | PRN
  Administered 2019-06-03 – 2019-06-04 (×3): 650 mg via ORAL
  Filled 2019-06-01 (×3): qty 2

## 2019-06-01 MED ORDER — STERILE WATER FOR INJECTION IJ SOLN
INTRAMUSCULAR | Status: AC
  Filled 2019-06-01: qty 10

## 2019-06-01 MED ORDER — ALUM & MAG HYDROXIDE-SIMETH 200-200-20 MG/5ML PO SUSP: 30.0000 mL | Freq: Four times a day (QID) | ORAL | Status: DC | PRN

## 2019-06-01 MED ORDER — ZIPRASIDONE MESYLATE 20 MG IM SOLR: 10.0000 mg | INTRAMUSCULAR | Status: DC | PRN

## 2019-06-01 MED ORDER — ZIPRASIDONE MESYLATE 20 MG IM SOLR
10.0000 mg | Freq: Once | INTRAMUSCULAR | Status: AC
  Administered 2019-06-01: 22:00:00 10 mg via INTRAMUSCULAR
  Filled 2019-06-01: qty 20

## 2019-06-01 MED ORDER — ZIPRASIDONE MESYLATE 20 MG IM SOLR: 20.0000 mg | INTRAMUSCULAR | Status: DC | PRN

## 2019-06-01 MED ORDER — RISPERIDONE 2 MG PO TBDP
2.0000 mg | ORAL_TABLET | Freq: Three times a day (TID) | ORAL | Status: DC | PRN
  Administered 2019-06-04 – 2019-06-05 (×2): 2 mg via ORAL
  Filled 2019-06-01 (×3): qty 1

## 2019-06-01 MED ORDER — PIOGLITAZONE HCL 30 MG PO TABS
30.0000 mg | ORAL_TABLET | Freq: Every day | ORAL | Status: DC
  Administered 2019-06-02 – 2019-06-05 (×4): 30 mg via ORAL
  Filled 2019-06-01 (×5): qty 1

## 2019-06-01 MED ORDER — SERTRALINE HCL 100 MG PO TABS
200.0000 mg | ORAL_TABLET | Freq: Every day | ORAL | Status: DC
  Administered 2019-06-02 – 2019-06-05 (×4): 200 mg via ORAL
  Filled 2019-06-01 (×3): qty 2
  Filled 2019-06-01: qty 4
  Filled 2019-06-01 (×2): qty 2

## 2019-06-01 NOTE — ED Notes (Signed)
Security assisted patient into scrubs.

## 2019-06-01 NOTE — ED Notes (Signed)
Patient continues to come out of room while loudly cussing at staff. Security remains present.
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
## 2019-06-01 NOTE — ED Notes (Signed)
Patient calm and cooperative at this time. Given cheese sticks, crackers, apple juice, and diet soda.
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
## 2019-06-01 NOTE — ED Notes (Addendum)
Pt changed into scrubs but left his shirts on underneath scrub top.  Asked him to remove those as well and pt became agitated and demanded to see provider.  PA notified and advised to get security involved to ensure pt is dressed out.  Pt was also provided with water at PA approval.  4 pt belonging bags were placed behind nurses station.

## 2019-06-01 NOTE — ED Notes (Signed)
Patient refusing to change into scrubs. Patient requesting to speak with provider. Cussing at staff demanding that staff stay out of room.
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
## 2019-06-01 NOTE — ED Notes (Signed)
Security at bedside during administration of Geodon.

## 2019-06-01 NOTE — ED Notes (Signed)
Security called to bedside.

## 2019-06-01 NOTE — ED Notes (Signed)
Nadyne Coombes, patient's daughter, would like updates and can be reached at [phone].

## 2019-06-01 NOTE — Progress Notes (Signed)
Per Lindon Romp, NP pt is recommended for gero-psych tx. TTS to seek placement. Lilly, RN and EDP Domenic Moras, PA-C have been advised.  Lind Covert, MSW, LCSW Therapeutic Triage Specialist  [phone]

## 2019-06-01 NOTE — ED Notes (Signed)
Pt was given scrubs to change in to and pt belonging bags to place his belongings in.  PA at bedside currently.

## 2019-06-02 ENCOUNTER — Other Ambulatory Visit: Payer: Self-pay

## 2019-06-02 DIAGNOSIS — Z87891 Personal history of nicotine dependence: Secondary | ICD-10-CM | POA: Diagnosis not present

## 2019-06-02 DIAGNOSIS — I120 Hypertensive chronic kidney disease with stage 5 chronic kidney disease or end stage renal disease: Secondary | ICD-10-CM | POA: Diagnosis not present

## 2019-06-02 DIAGNOSIS — Z79899 Other long term (current) drug therapy: Secondary | ICD-10-CM | POA: Diagnosis not present

## 2019-06-02 DIAGNOSIS — N186 End stage renal disease: Secondary | ICD-10-CM | POA: Diagnosis not present

## 2019-06-02 DIAGNOSIS — J45909 Unspecified asthma, uncomplicated: Secondary | ICD-10-CM | POA: Diagnosis not present

## 2019-06-02 DIAGNOSIS — R451 Restlessness and agitation: Secondary | ICD-10-CM | POA: Diagnosis present

## 2019-06-02 DIAGNOSIS — R455 Hostility: Secondary | ICD-10-CM | POA: Diagnosis not present

## 2019-06-02 DIAGNOSIS — R440 Auditory hallucinations: Secondary | ICD-10-CM | POA: Diagnosis not present

## 2019-06-02 DIAGNOSIS — Z992 Dependence on renal dialysis: Secondary | ICD-10-CM | POA: Diagnosis not present

## 2019-06-02 DIAGNOSIS — Z7982 Long term (current) use of aspirin: Secondary | ICD-10-CM | POA: Diagnosis not present

## 2019-06-02 DIAGNOSIS — Z20822 Contact with and (suspected) exposure to covid-19: Secondary | ICD-10-CM | POA: Diagnosis not present

## 2019-06-02 DIAGNOSIS — E1122 Type 2 diabetes mellitus with diabetic chronic kidney disease: Secondary | ICD-10-CM | POA: Diagnosis not present

## 2019-06-02 DIAGNOSIS — Z046 Encounter for general psychiatric examination, requested by authority: Secondary | ICD-10-CM | POA: Diagnosis not present

## 2019-06-02 DIAGNOSIS — F23 Brief psychotic disorder: Secondary | ICD-10-CM | POA: Diagnosis not present

## 2019-06-02 LAB — URINALYSIS, ROUTINE W REFLEX MICROSCOPIC
Bacteria, UA: NONE SEEN
Bilirubin Urine: NEGATIVE
Glucose, UA: 50 mg/dL — AB
Ketones, ur: NEGATIVE mg/dL
Leukocytes,Ua: NEGATIVE
Nitrite: NEGATIVE
Protein, ur: 100 mg/dL — AB
Specific Gravity, Urine: 1.009 (ref 1.005–1.030)
pH: 7 (ref 5.0–8.0)

## 2019-06-02 LAB — RENAL FUNCTION PANEL
Albumin: 3.6 g/dL (ref 3.5–5.0)
Anion gap: 17 — ABNORMAL HIGH (ref 5–15)
BUN: 88 mg/dL — ABNORMAL HIGH (ref 8–23)
CO2: 24 mmol/L (ref 22–32)
Calcium: 9 mg/dL (ref 8.9–10.3)
Chloride: 97 mmol/L — ABNORMAL LOW (ref 98–111)
Creatinine, Ser: 8.52 mg/dL — ABNORMAL HIGH (ref 0.61–1.24)
GFR calc Af Amer: 6 mL/min — ABNORMAL LOW (ref >60)
GFR calc non Af Amer: 6 mL/min — ABNORMAL LOW (ref >60)
Glucose, Bld: 225 mg/dL — ABNORMAL HIGH (ref 70–99)
Phosphorus: 6.7 mg/dL — ABNORMAL HIGH (ref 2.5–4.6)
Potassium: 5 mmol/L (ref 3.5–5.1)
Sodium: 138 mmol/L (ref 135–145)

## 2019-06-02 LAB — RAPID URINE DRUG SCREEN, HOSP PERFORMED
Amphetamines: NOT DETECTED
Barbiturates: NOT DETECTED
Benzodiazepines: NOT DETECTED
Cocaine: NOT DETECTED
Opiates: NOT DETECTED
Tetrahydrocannabinol: NOT DETECTED

## 2019-06-02 LAB — HEMOGLOBIN A1C
Hgb A1c MFr Bld: 5.2 % (ref 4.8–5.6)
Mean Plasma Glucose: 102.54 mg/dL

## 2019-06-02 LAB — CBG MONITORING, ED
Glucose-Capillary: 125 mg/dL — ABNORMAL HIGH (ref 70–99)
Glucose-Capillary: 87 mg/dL (ref 70–99)
Glucose-Capillary: 241 mg/dL — ABNORMAL HIGH (ref 70–99)
Glucose-Capillary: 165 mg/dL — ABNORMAL HIGH (ref 70–99)
Glucose-Capillary: 103 mg/dL — ABNORMAL HIGH (ref 70–99)

## 2019-06-02 LAB — RESPIRATORY PANEL BY RT PCR (FLU A&B, COVID)
Influenza A by PCR: NEGATIVE
Influenza B by PCR: NEGATIVE
SARS Coronavirus 2 by RT PCR: NEGATIVE

## 2019-06-02 MED ORDER — CHLORHEXIDINE GLUCONATE CLOTH 2 % EX PADS: 6.0000 | MEDICATED_PAD | Freq: Every day | CUTANEOUS | Status: DC

## 2019-06-02 MED ORDER — CALCITRIOL 0.25 MCG PO CAPS
0.2500 ug | ORAL_CAPSULE | ORAL | Status: DC
  Filled 2019-06-02: qty 1

## 2019-06-02 MED ORDER — SEVELAMER CARBONATE 800 MG PO TABS
2400.0000 mg | ORAL_TABLET | Freq: Three times a day (TID) | ORAL | Status: DC
  Administered 2019-06-03 – 2019-06-05 (×7): 2400 mg via ORAL
  Filled 2019-06-02 (×14): qty 3

## 2019-06-02 NOTE — ED Notes (Signed)
Pt able to give urine sample.

## 2019-06-02 NOTE — ED Notes (Signed)
Pt used RN phone for phone call.

## 2019-06-02 NOTE — ED Notes (Signed)
Nephrologist aware pt has arrived.

## 2019-06-02 NOTE — ED Notes (Signed)
Pt stated that he needs to get home early tomorrow because he missed his home dialysis and needs to do it.

## 2019-06-02 NOTE — ED Notes (Signed)
Reported to pharmacy that pt needs med rec reconciled. Pt reports taking rengala with meals and snacks and it is not on his med rec.

## 2019-06-02 NOTE — ED Notes (Signed)
Nephrologist in w/pt.

## 2019-06-02 NOTE — ED Notes (Signed)
Per Dr. Elberta Fortis Allen's orders, pt transferred to [place] by [facility]. Pt was cooperative.

## 2019-06-02 NOTE — ED Notes (Signed)
Pt allowed to use telephone each time he asked to use it which is twice up to now on day shift.  He has been verbally aggressive with his wife and insisting that his rights have been violated.  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  Staff are treating pt with the respect and consider afforded to all patients.
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
## 2019-06-02 NOTE — ED Notes (Addendum)
Scott Jones called by this Probation officer for transport to [facility]

## 2019-06-02 NOTE — Progress Notes (Signed)
CSW notes psychiatric team called and stated pt needed inpatient psychiatric tx and CSW notes pt was referred out by [facility] Disposition social worker today (06/01/18).  CSW will continue to follow for D/C needs.  Scott Guild. Gates Jividen, LCSW, LCAS, CSI Transitions of Care Clinical Social Worker Care Coordination Department Ph: [phone]

## 2019-06-02 NOTE — ED Notes (Signed)
Report given to [facility] on [place].  Becky informed that the IVC First Opinion has not appeared on the chart yet. It was to be done by the Psych Team today and Waunita Schooner with TTS was going to facilitate getting it on the chart. If it is not on the chart by the time [facility] arrives, this writer will fax it to the [place]

## 2019-06-02 NOTE — ED Notes (Signed)
Pt is aggravated at being here and does not agree with or understand the IVC he is under.  As he is a Education officer, museum, he does understand the process somewhat, except he believes that he can sue [place] and shut [place] down that we are corrupt. Have informed NP Lewis of this and that he is insisting that he should have HD.

## 2019-06-02 NOTE — ED Notes (Signed)
Pt speaking with his wife on the telephone and is very upset that he is here.  He said that he is going to take this to the [employer] [employer] [employer].

## 2019-06-02 NOTE — Progress Notes (Signed)
Patient meets criteria for inpatient treatment per Ricky Ala NP. No appropriate beds available at [facility], as pt requires gero-psych placement. CSW faxed referrals to the following facilities for review:  [facility], [facility], [facility], [facility], [facility], [facility], [facility].  TTS will continue to seek bed placement.   Maxie Better, MSW, LCSW Clinical Social Worker 06/02/2019 1:30 PM

## 2019-06-02 NOTE — Consult Note (Signed)
[facility]  INPATIENT CONSULTATION  Reason for Consultation: ESRD Requesting Provider: Dr. Lacretia Leigh  HPI: Scott Jones is an 74 y.o. male with ESRD on HHD 4x/wk, anemia, HTN, HCV, DM who is seen for eval and management of ESRD and assoc conditions in setting of IVC hold.   Pt was brought to ED by [employer] after attempting to enter a home of someone he says was a Designer, fashion/clothing.  He was having disordered and delusional thinking per his wife and has been placed under IVC hold.    He typically does HHD 4x wk and reports his last HD was on Thursday.  His AVF has buttonholes and he's had no problems with it of late.   PMH: Past Medical History:  Diagnosis Date  . Allergy   . Anemia   . Asthma   . Chronic kidney disease   . Depression   . Deviated septum   . Diabetes mellitus   . Hearing aid worn     B/L  . Hepatitis C   . Hypertension   . Infection 03/2018   RIGHT ARM DIALYSIS CATHETER  . Pneumonia   . Wears glasses    PSH: Past Surgical History:  Procedure Laterality Date  . AV FISTULA PLACEMENT    . AV FISTULA PLACEMENT Right 09/21/2018   Procedure: ARTERIOVENOUS (AV) FISTULA CREATION RIGHT ARM;  Surgeon: [name], MD;  Location: [facility];  Service: Cardiovascular;  Laterality: Right;  . COLONOSCOPY W/ BIOPSIES AND POLYPECTOMY    . EYE SURGERY    . IR FLUORO GUIDE CV LINE RIGHT  03/18/2018  . IR US GUIDE VASC ACCESS RIGHT  03/18/2018  . NEPHRECTOMY     partial - benign tumor  . PARTIAL NEPHRECTOMY  10/08/2011   Left kidney  . TONSILLECTOMY      Past Medical History:  Diagnosis Date  . Allergy   . Anemia   . Asthma   . Chronic kidney disease   . Depression   . Deviated septum   . Diabetes mellitus   . Hearing aid worn     B/L  . Hepatitis C   . Hypertension   . Infection 03/2018   RIGHT ARM DIALYSIS CATHETER  . Pneumonia   . Wears glasses     Medications:  I have reviewed the patient's current medications.  (Not  in a hospital admission)   ALLERGIES:   Allergies  Allergen Reactions  . Wellbutrin [Bupropion] Anxiety    Hyper/anxious    FAM HX: Family History  Problem Relation Age of Onset  . Diabetes Mother   . Hypertension Mother   . COPD Mother   . Asthma Mother   . Diabetes Father   . Diabetes Sister   . Diabetes Brother   . Kidney disease Brother     Social History:   reports that he quit smoking about 44 years ago. His smoking use included cigarettes and pipe. He has a 11.00 pack-year smoking history. He has never used smokeless tobacco. He reports current alcohol use of about 1.0 standard drinks of alcohol per week. He reports previous drug use. Drug: Marijuana.  ROS: 12 system ros per HPI above  Blood pressure 131/75, pulse 69, temperature 98.3 F (36.8 C), temperature source Oral, resp. rate 18, SpO2 95 %. PHYSICAL EXAM: Gen: seated calmly on bed  Eyes:  anicteric ENT:MMM Neck: no JVD CV:  RRR Abd:  Soft, nontender Lungs: clear to bases Extr:  No edema RUE  AVF +t/b with 2 normal buttonholes Neuro: nonfocal Psych: calm on my exam   Results for orders placed or performed during the hospital encounter of 06/01/19 (from the past 48 hour(s))  Urine rapid drug screen (hosp performed)     Status: None   Collection Time: 06/01/19  8:59 PM  Result Value Ref Range   Opiates NONE DETECTED NONE DETECTED   Cocaine NONE DETECTED NONE DETECTED   Benzodiazepines NONE DETECTED NONE DETECTED   Amphetamines NONE DETECTED NONE DETECTED   Tetrahydrocannabinol NONE DETECTED NONE DETECTED   Barbiturates NONE DETECTED NONE DETECTED    Comment: (NOTE) DRUG SCREEN FOR MEDICAL PURPOSES ONLY.  IF CONFIRMATION IS NEEDED FOR ANY PURPOSE, NOTIFY LAB WITHIN 5 DAYS. LOWEST DETECTABLE LIMITS FOR URINE DRUG SCREEN Drug Class                     Cutoff (ng/mL) Amphetamine and metabolites    1000 Barbiturate and metabolites    200 Benzodiazepine                 200 Tricyclics and metabolites      300 Opiates and metabolites        300 Cocaine and metabolites        300 THC                            50 Performed at [facility], [address] [place], [place] [address]   Urinalysis, Routine w reflex microscopic     Status: Abnormal   Collection Time: 06/01/19  8:59 PM  Result Value Ref Range   Color, Urine STRAW (A) YELLOW   APPearance CLEAR CLEAR   Specific Gravity, Urine 1.009 1.005 - 1.030   pH 7.0 5.0 - 8.0   Glucose, UA 50 (A) NEGATIVE mg/dL   Hgb urine dipstick SMALL (A) NEGATIVE   Bilirubin Urine NEGATIVE NEGATIVE   Ketones, ur NEGATIVE NEGATIVE mg/dL   Protein, ur 084 (A) NEGATIVE mg/dL   Nitrite NEGATIVE NEGATIVE   Leukocytes,Ua NEGATIVE NEGATIVE   RBC / HPF 0-5 0 - 5 RBC/hpf   WBC, UA 0-5 0 - 5 WBC/hpf   Bacteria, UA NONE SEEN NONE SEEN    Comment: Performed at [facility], [address] [place], [place] [address]  Comprehensive metabolic panel     Status: Abnormal   Collection Time: 06/01/19  9:24 PM  Result Value Ref Range   Sodium 140 135 - 145 mmol/L   Potassium 4.7 3.5 - 5.1 mmol/L   Chloride 99 98 - 111 mmol/L   CO2 25 22 - 32 mmol/L   Glucose, Bld 106 (H) 70 - 99 mg/dL   BUN 67 (H) 8 - 23 mg/dL   Creatinine, Ser 7.41 (H) 0.61 - 1.24 mg/dL   Calcium 9.1 8.9 - 87.3 mg/dL   Total Protein 8.4 (H) 6.5 - 8.1 g/dL   Albumin 4.5 3.5 - 5.0 g/dL   AST 25 15 - 41 U/L   ALT 19 0 - 44 U/L   Alkaline Phosphatase 56 38 - 126 U/L   Total Bilirubin 0.6 0.3 - 1.2 mg/dL   GFR calc non Af Amer 7 (L) >60 mL/min   GFR calc Af Amer 8 (L) >60 mL/min   Anion gap 16 (H) 5 - 15    Comment: Performed at [facility], [address] [place], [place] [address]  Ethanol  Status: None   Collection Time: 06/01/19  9:24 PM  Result Value Ref Range   Alcohol, Ethyl (B) <10 <10 mg/dL    Comment: (NOTE) Lowest detectable limit for serum alcohol is 10 mg/dL. For medical purposes only. Performed at [facility], [place] [address], [place], [place] [address]   CBC with Differential     Status: Abnormal   Collection Time: 06/01/19  9:24 PM  Result Value Ref Range   WBC 8.1 4.0 - 10.5 K/uL   RBC 3.94 (L) 4.22 - 5.81 MIL/uL   Hemoglobin 13.2 13.0 - 17.0 g/dL   HCT 42.6 39.0 - 52.0 %   MCV 108.1 (H) 80.0 - 100.0 fL   MCH 33.5 26.0 - 34.0 pg   MCHC 31.0 30.0 - 36.0 g/dL   RDW 14.2 11.5 - 15.5 %   Platelets 158 150 - 400 K/uL   nRBC 0.0 0.0 - 0.2 %   Neutrophils Relative % 82 %   Neutro Abs 6.7 1.7 - 7.7 K/uL   Lymphocytes Relative 9 %   Lymphs Abs 0.8 0.7 - 4.0 K/uL   Monocytes Relative 7 %   Monocytes Absolute 0.5 0.1 - 1.0 K/uL   Eosinophils Relative 2 %   Eosinophils Absolute 0.1 0.0 - 0.5 K/uL   Basophils Relative 0 %   Basophils Absolute 0.0 0.0 - 0.1 K/uL   Immature Granulocytes 0 %   Abs Immature Granulocytes 0.03 0.00 - 0.07 K/uL    Comment: Performed at [facility], [place] [address], [place], [place] [address]  Respiratory Panel by RT PCR (Flu A&B, Covid) - Nasopharyngeal Swab     Status: None   Collection Time: 06/01/19  9:52 PM   Specimen: Nasopharyngeal Swab  Result Value Ref Range   SARS Coronavirus 2 by RT PCR NEGATIVE NEGATIVE    Comment: (NOTE) SARS-CoV-2 target nucleic acids are NOT DETECTED. The SARS-CoV-2 RNA is generally detectable in upper respiratoy specimens during the acute phase of infection. The lowest concentration of SARS-CoV-2 viral copies this assay can detect is 131 copies/mL. A negative result does not preclude SARS-Cov-2 infection and should not be used as the sole basis for treatment or other patient management decisions. A negative result may occur with  improper specimen collection/handling, submission of specimen other than nasopharyngeal swab, presence of viral mutation(s) within the areas targeted by this assay, and inadequate number of viral copies (<131 copies/mL). A negative result must be combined with  clinical observations, patient history, and epidemiological information. The expected result is Negative. Fact Sheet for Patients:  PinkCheek.be Fact Sheet for Healthcare Providers:  GravelBags.it This test is not yet ap proved or cleared by the [place] FDA and  has been authorized for detection and/or diagnosis of SARS-CoV-2 by FDA under an Emergency Use Authorization (EUA). This EUA will remain  in effect (meaning this test can be used) for the duration of the COVID-19 declaration under Section 564(b)(1) of the Act, 21 U.S.C. section 360bbb-3(b)(1), unless the authorization is terminated or revoked sooner.    Influenza A by PCR NEGATIVE NEGATIVE   Influenza B by PCR NEGATIVE NEGATIVE    Comment: (NOTE) The Xpert Xpress SARS-CoV-2/FLU/RSV assay is intended as an aid in  the diagnosis of influenza from Nasopharyngeal swab specimens and  should not be used as a sole basis for treatment. Nasal washings and  aspirates are unacceptable for Xpert Xpress SARS-CoV-2/FLU/RSV  testing. Fact Sheet for Patients: PinkCheek.be Fact Sheet for Healthcare Providers: GravelBags.it  This test is not yet approved or cleared by the [place] and  has been authorized for detection and/or diagnosis of SARS-CoV-2 by  FDA under an Emergency Use Authorization (EUA). This EUA will remain  in effect (meaning this test can be used) for the duration of the  Covid-19 declaration under Section 564(b)(1) of the Act, 21  U.S.C. section 360bbb-3(b)(1), unless the authorization is  terminated or revoked. Performed at [facility], [place] [address] [place], [place] [address]   Hemoglobin A1c     Status: None   Collection Time: 06/01/19  9:56 PM  Result Value Ref Range   Hgb A1c MFr Bld 5.2 4.8 - 5.6 %    Comment: (NOTE) Pre diabetes:          5.7%-6.4% Diabetes:               >6.4% Glycemic control for   <7.0% adults with diabetes    Mean Plasma Glucose 102.54 mg/dL    Comment: Performed at [facility] [address], [place], [place] [address]  CBG monitoring, ED     Status: Abnormal   Collection Time: 06/02/19  1:21 AM  Result Value Ref Range   Glucose-Capillary 125 (H) 70 - 99 mg/dL  CBG monitoring, ED     Status: None   Collection Time: 06/02/19  7:50 AM  Result Value Ref Range   Glucose-Capillary 87 70 - 99 mg/dL  CBG monitoring, ED     Status: Abnormal   Collection Time: 06/02/19 11:24 AM  Result Value Ref Range   Glucose-Capillary 241 (H) 70 - 99 mg/dL  CBG monitoring, ED     Status: Abnormal   Collection Time: 06/02/19  5:55 PM  Result Value Ref Range   Glucose-Capillary 165 (H) 70 - 99 mg/dL    No results found.   Dialysis Rx: MTRS BFR 400 EDW 75kg AVF  40L FF 54% Heparin 2000u qtx  Assessment/Plan  **ESRD on HHD:  4x/wk on home hemo, we will plan for 3x/wk here.  Plan next HD tomorrow or Monday depending on labs and patient volume. UF to EDW.    **IVC: management per psychiatry,   **Anemia of CKD:  Hb acceptable, not on ESA outpt  **BMM:  Cont outpt calcitriol 0.25 TIW.  Monitor corr ca and phos here. sevelamer 2400 TIDAC.   **DM:  On pioglitazone  **HTN: normotensive, cont nifedipine 60 daily  [name] 06/02/2019, 7:08 PM

## 2019-06-02 NOTE — ED Notes (Signed)
Pt has arrived to [facility] via [facility]. Pt is under IVC - Waunita Schooner, [place] SW, to fax 1st Exam. Belongings - 3 labeled belongings bags - placed at nurses' desk for inventory. Pt is ambulatory. Sitter w/pt.

## 2019-06-02 NOTE — ED Provider Notes (Signed)
Patient is under IVC and has history of end-stage renal disease and is on dialysis.  Spoke to Dr. Johnney Ou from nephrology service who states that patient will need to be transferred to [facility] so that he can have his regular dialysis until psychiatric placement can be made.   Lacretia Leigh, MD 06/02/19 1420

## 2019-06-02 NOTE — Progress Notes (Signed)
[facility] MD Progress Note ED  06/02/2019 10:10 AM [place]  MRN:  [id number]   Evaluation: Scott Jones was seen and evaluated by nurse practitioner and counselor.  He is awake, alert and oriented to place and self.  Scott Jones  is slightly agitated and irritable during this assessment. patient continues to report this admission was a mistake. "  Nobody is listening to me" citing he was not attempting to break in the residents.  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 Scott Jones is denying suicidal or homicidal ideations.  Denies auditory or visual hallucinations.  Treatment team to follow-up with additional collateral information.  We will continue to monitor for safety.  Per initial assessment note: Scott Jones is an 74 y.o. male who presents to ED under IVC initiated by his wife. Per IVC, respondent "is hearing voices and has become hostile and was found in a persons residence and became aggressive." Pt is visibly irritated during the assessment and states he feels that his rights are being violated. Pt states Dr. 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 Pt states he feels that he experiences Deja vu, and things happen to him that have already happened. Pt also states he has conversations with people that he later finds out that never happened. Pt states he experiences these episodes daily.   Principal Problem: <principal problem not specified> Diagnosis: Active Problems:   * No active hospital problems. *  Total Time spent with patient: 15 minutes  Past Psychiatric History:   Past Medical History:  Past Medical History:  Diagnosis Date  . Allergy   . Anemia   . Asthma   . Chronic kidney disease   . Depression   . Deviated septum   . Diabetes mellitus   . Hearing aid worn     B/L  . Hepatitis C   . Hypertension   . Infection 03/2018   RIGHT ARM DIALYSIS CATHETER  . Pneumonia   . Wears glasses     Past Surgical History:  Procedure Laterality Date   . AV FISTULA PLACEMENT    . AV FISTULA PLACEMENT Right 09/21/2018   Procedure: ARTERIOVENOUS (AV) FISTULA CREATION RIGHT ARM;  Surgeon: [name], MD;  Location: [facility];  Service: Cardiovascular;  Laterality: Right;  . COLONOSCOPY W/ BIOPSIES AND POLYPECTOMY    . EYE SURGERY    . IR FLUORO GUIDE CV LINE RIGHT  03/18/2018  . IR US GUIDE VASC ACCESS RIGHT  03/18/2018  . NEPHRECTOMY     partial - benign tumor  . PARTIAL NEPHRECTOMY  10/08/2011   Left kidney  . TONSILLECTOMY     Family History:  Family History  Problem Relation Age of Onset  . Diabetes Mother   . Hypertension Mother   . COPD Mother   . Asthma Mother   . Diabetes Father   . Diabetes Sister   . Diabetes Brother   . Kidney disease Brother    Family Psychiatric  History:  Social History:  Social History   Substance and Sexual Activity  Alcohol Use Yes  . Alcohol/week: 1.0 standard drinks  . Types: 1 Cans of beer per week   Comment: 1 glass wine weekly     Social History   Substance and Sexual Activity  Drug Use Not Currently  . Types: Marijuana   Comment: Marijuana cookie/candy 1x monthly    Social History   Socioeconomic History  . Marital  status: Married    Spouse name: Not on file  . Number of children: Not on file  . Years of education: Not on file  . Highest education level: Not on file  Occupational History  . Not on file  Tobacco Use  . Smoking status: Former Smoker    Packs/day: 1.00    Years: 11.00    Pack years: 11.00    Types: Cigarettes, Pipe    Quit date: 08/07/1974    Years since quitting: 44.8  . Smokeless tobacco: Never Used  Substance and Sexual Activity  . Alcohol use: Yes    Alcohol/week: 1.0 standard drinks    Types: 1 Cans of beer per week    Comment: 1 glass wine weekly  . Drug use: Not Currently    Types: Marijuana    Comment: Marijuana cookie/candy 1x monthly  . Sexual activity: Yes    Birth control/protection: None  Other Topics Concern  . Not on  file  Social History Narrative   ** Merged History Encounter **       Social Determinants of Health   Financial Resource Strain:   . Difficulty of Paying Living Expenses: Not on file  Food Insecurity:   . Worried About Charity fundraiser in the Last Year: Not on file  . Ran Out of Food in the Last Year: Not on file  Transportation Needs:   . Lack of Transportation (Medical): Not on file  . Lack of Transportation (Non-Medical): Not on file  Physical Activity:   . Days of Exercise per Week: Not on file  . Minutes of Exercise per Session: Not on file  Stress:   . Feeling of Stress : Not on file  Social Connections:   . Frequency of Communication with Friends and Family: Not on file  . Frequency of Social Gatherings with Friends and Family: Not on file  . Attends Religious Services: Not on file  . Active Member of Clubs or Organizations: Not on file  . Attends Archivist Meetings: Not on file  . Marital Status: Not on file   Additional Social History:    Pain Medications: See MAR Prescriptions: See MAR Over the Counter: See MAR History of alcohol / drug use?: No history of alcohol / drug abuse                    Sleep: Fair  Appetite:  Fair  Current Medications: Current Facility-Administered Medications  Medication Dose Route Frequency Provider Last Rate Last Admin  . acetaminophen (TYLENOL) tablet 650 mg  650 mg Oral Q4H PRN Domenic Moras, PA-C      . alum & mag hydroxide-simeth (MAALOX/MYLANTA) 200-200-20 MG/5ML suspension 30 mL  30 mL Oral Q6H PRN Domenic Moras, PA-C      . aspirin EC tablet 81 mg  81 mg Oral Daily Domenic Moras, PA-C      . atorvastatin (LIPITOR) tablet 40 mg  40 mg Oral Daily Domenic Moras, PA-C      . insulin aspart (novoLOG) injection 0-15 Units  0-15 Units Subcutaneous TID WC Domenic Moras, PA-C      . insulin aspart (novoLOG) injection 0-5 Units  0-5 Units Subcutaneous QHS Domenic Moras, PA-C      . ziprasidone (GEODON) injection 10 mg  10  mg Intramuscular PRN Domenic Moras, PA-C       And  . risperiDONE (RISPERDAL M-TABS) disintegrating tablet 2 mg  2 mg Oral Q8H PRN Domenic Moras, PA-C  And  . LORazepam (ATIVAN) tablet 1 mg  1 mg Oral PRN Domenic Moras, PA-C      . nicotine (NICODERM CQ - dosed in mg/24 hours) patch 21 mg  21 mg Transdermal Daily Domenic Moras, PA-C      . NIFEdipine (PROCARDIA XL/NIFEDICAL XL) 24 hr tablet 60 mg  60 mg Oral QHS Domenic Moras, PA-C      . ondansetron [facility]) tablet 4 mg  4 mg Oral Q8H PRN Domenic Moras, PA-C      . pioglitazone (ACTOS) tablet 30 mg  30 mg Oral Daily Domenic Moras, PA-C      . sertraline (ZOLOFT) tablet 200 mg  200 mg Oral Daily Domenic Moras, PA-C      . sevelamer carbonate (RENVELA) tablet 1,600 mg  1,600 mg Oral TID WC Domenic Moras, PA-C      . zolpidem (AMBIEN) tablet 5 mg  5 mg Oral QHS PRN Domenic Moras, PA-C       Current Outpatient Medications  Medication Sig Dispense Refill  . allopurinol (ZYLOPRIM) 100 MG tablet Take 1 tablet (100 mg total) by mouth daily. 90 tablet 3  . aspirin 81 MG tablet Take 81 mg by mouth daily.    Marland Kitchen atorvastatin (LIPITOR) 40 MG tablet Take 1 tablet (40 mg total) by mouth daily. 90 tablet 3  . B Complex-C-Folic Acid (DIALYVITE TABLET) TABS Take 1 tablet by mouth at bedtime.    . calcitRIOL (ROCALTROL) 0.25 MCG capsule Take 0.25 mcg by mouth 3 (three) times a week.    . docusate sodium (COLACE) 100 MG capsule Take 100 mg by mouth daily as needed for mild constipation.     . lidocaine-prilocaine (EMLA) cream Apply 1 application topically as needed. Port access    . NIFEdipine (ADALAT CC) 60 MG 24 hr tablet Take 1 tablet (60 mg total) by mouth at bedtime. 90 tablet 3  . pioglitazone (ACTOS) 30 MG tablet Take 1 tablet by mouth once daily (Patient taking differently: Take 30 mg by mouth daily. ) 90 tablet 1  . polyvinyl alcohol (LIQUIFILM TEARS) 1.4 % ophthalmic solution Place 1 drop into both eyes as needed for dry eyes.    Marland Kitchen sertraline (ZOLOFT) 100 MG tablet  Take 2 tablets (200 mg total) by mouth daily. 180 tablet 1  . sevelamer carbonate (RENVELA) 800 MG tablet Take 2,400 mg by mouth 3 (three) times daily with meals.     Marland Kitchen amoxicillin (AMOXIL) 500 MG tablet Take 500-2,000 mg by mouth as directed. 2043m on day 1, then 5051mTID until gone. 7 day supply    . blood glucose meter kit and supplies Pt requesting ONE TOUCH. Use up to 3 times daily as directed. (FOR ICD-10 E10.9, E11.9). 1 each 0  . Blood Glucose Monitoring Suppl (ONE TOUCH ULTRA 2) w/Device KIT Use as directed once a day.  E11.9 1 each 0  . glucose blood test strip Use as directed once a day.  Dx code E11.9 100 each 12  . meloxicam (MOBIC) 7.5 MG tablet Take 1 tablet (7.5 mg total) by mouth daily. (Patient not taking: Reported on 06/02/2019) 30 tablet 3  . methocarbamol (ROBAXIN) 500 MG tablet Take 1 tablet (500 mg total) by mouth every 8 (eight) hours as needed for muscle spasms. (Patient not taking: Reported on 06/02/2019) 60 tablet 3  . ONETOUCH DELICA LANCETS 3093YISC 1 each by Does not apply route daily. Dx code E11.9 100 each 1    Lab Results:  Results  for orders placed or performed during the hospital encounter of 06/01/19 (from the past 48 hour(s))  Urine rapid drug screen (hosp performed)     Status: None   Collection Time: 06/01/19  8:59 PM  Result Value Ref Range   Opiates NONE DETECTED NONE DETECTED   Cocaine NONE DETECTED NONE DETECTED   Benzodiazepines NONE DETECTED NONE DETECTED   Amphetamines NONE DETECTED NONE DETECTED   Tetrahydrocannabinol NONE DETECTED NONE DETECTED   Barbiturates NONE DETECTED NONE DETECTED    Comment: (NOTE) DRUG SCREEN FOR MEDICAL PURPOSES ONLY.  IF CONFIRMATION IS NEEDED FOR ANY PURPOSE, NOTIFY LAB WITHIN 5 DAYS. LOWEST DETECTABLE LIMITS FOR URINE DRUG SCREEN Drug Class                     Cutoff (ng/mL) Amphetamine and metabolites    1000 Barbiturate and metabolites    200 Benzodiazepine                 030 Tricyclics and metabolites      300 Opiates and metabolites        300 Cocaine and metabolites        300 THC                            50 Performed at [facility], [place] [address] [place], [place] [address]   Urinalysis, Routine w reflex microscopic     Status: Abnormal   Collection Time: 06/01/19  8:59 PM  Result Value Ref Range   Color, Urine STRAW (A) YELLOW   APPearance CLEAR CLEAR   Specific Gravity, Urine 1.009 1.005 - 1.030   pH 7.0 5.0 - 8.0   Glucose, UA 50 (A) NEGATIVE mg/dL   Hgb urine dipstick SMALL (A) NEGATIVE   Bilirubin Urine NEGATIVE NEGATIVE   Ketones, ur NEGATIVE NEGATIVE mg/dL   Protein, ur 100 (A) NEGATIVE mg/dL   Nitrite NEGATIVE NEGATIVE   Leukocytes,Ua NEGATIVE NEGATIVE   RBC / HPF 0-5 0 - 5 RBC/hpf   WBC, UA 0-5 0 - 5 WBC/hpf   Bacteria, UA NONE SEEN NONE SEEN    Comment: Performed at [facility], [place] [address], [place], [place] [address]  Comprehensive metabolic panel     Status: Abnormal   Collection Time: 06/01/19  9:24 PM  Result Value Ref Range   Sodium 140 135 - 145 mmol/L   Potassium 4.7 3.5 - 5.1 mmol/L   Chloride 99 98 - 111 mmol/L   CO2 25 22 - 32 mmol/L   Glucose, Bld 106 (H) 70 - 99 mg/dL   BUN 67 (H) 8 - 23 mg/dL   Creatinine, Ser 7.16 (H) 0.61 - 1.24 mg/dL   Calcium 9.1 8.9 - 10.3 mg/dL   Total Protein 8.4 (H) 6.5 - 8.1 g/dL   Albumin 4.5 3.5 - 5.0 g/dL   AST 25 15 - 41 U/L   ALT 19 0 - 44 U/L   Alkaline Phosphatase 56 38 - 126 U/L   Total Bilirubin 0.6 0.3 - 1.2 mg/dL   GFR calc non Af Amer 7 (L) >60 mL/min   GFR calc Af Amer 8 (L) >60 mL/min   Anion gap 16 (H) 5 - 15    Comment: Performed at [facility], [place] [address] [place], [place] [address]  Ethanol     Status: None   Collection Time: 06/01/19  9:24 PM  Result Value Ref  Range   Alcohol, Ethyl (B) <10 <10 mg/dL    Comment: (NOTE) Lowest detectable limit for serum alcohol is 10 mg/dL. For medical purposes only. Performed at [facility], [place] [address], [place], [place] [address]   CBC with Differential     Status: Abnormal   Collection Time: 06/01/19  9:24 PM  Result Value Ref Range   WBC 8.1 4.0 - 10.5 K/uL   RBC 3.94 (L) 4.22 - 5.81 MIL/uL   Hemoglobin 13.2 13.0 - 17.0 g/dL   HCT 42.6 39.0 - 52.0 %   MCV 108.1 (H) 80.0 - 100.0 fL   MCH 33.5 26.0 - 34.0 pg   MCHC 31.0 30.0 - 36.0 g/dL   RDW 14.2 11.5 - 15.5 %   Platelets 158 150 - 400 K/uL   nRBC 0.0 0.0 - 0.2 %   Neutrophils Relative % 82 %   Neutro Abs 6.7 1.7 - 7.7 K/uL   Lymphocytes Relative 9 %   Lymphs Abs 0.8 0.7 - 4.0 K/uL   Monocytes Relative 7 %   Monocytes Absolute 0.5 0.1 - 1.0 K/uL   Eosinophils Relative 2 %   Eosinophils Absolute 0.1 0.0 - 0.5 K/uL   Basophils Relative 0 %   Basophils Absolute 0.0 0.0 - 0.1 K/uL   Immature Granulocytes 0 %   Abs Immature Granulocytes 0.03 0.00 - 0.07 K/uL    Comment: Performed at [facility], [place] [address] [place], [place] [address]  Hemoglobin A1c     Status: None   Collection Time: 06/01/19  9:56 PM  Result Value Ref Range   Hgb A1c MFr Bld 5.2 4.8 - 5.6 %    Comment: (NOTE) Pre diabetes:          5.7%-6.4% Diabetes:              >6.4% Glycemic control for   <7.0% adults with diabetes    Mean Plasma Glucose 102.54 mg/dL    Comment: Performed at [place] [address], [place],  [address]  CBG monitoring, ED     Status: Abnormal   Collection Time: 06/02/19  1:21 AM  Result Value Ref Range   Glucose-Capillary 125 (H) 70 - 99 mg/dL  CBG monitoring, ED     Status: None   Collection Time: 06/02/19  7:50 AM  Result Value Ref Range   Glucose-Capillary 87 70 - 99 mg/dL    Blood Alcohol level:  Lab Results  Component Value Date   ETH <10 38/88/7579    Metabolic Disorder Labs: Lab Results  Component Value Date   HGBA1C 5.2 06/01/2019   MPG 102.54 06/01/2019   MPG 105 07/26/2016   No results found for: PROLACTIN Lab Results   Component Value Date   CHOL 131 07/26/2016   TRIG 137.0 07/26/2016   HDL 38.90 (L) 07/26/2016   CHOLHDL 3 07/26/2016   VLDL 27.4 07/26/2016   LDLCALC 65 07/26/2016   LDLCALC 92 08/07/2015    Physical Findings: AIMS:  , ,  ,  ,    CIWA:    COWS:     Musculoskeletal: Strength & Muscle Tone: within normal limits Gait & Station: normal Patient leans: N/A  Psychiatric Specialty Exam: Physical Exam  Vitals reviewed. Constitutional: He appears well-developed.  Cardiovascular: Normal rate.  Skin: Skin is warm.    Review of Systems  All other systems reviewed and are negative.   Blood pressure (!) 142/61, pulse 73, temperature 99 F (  37.2 C), temperature source Oral, resp. rate 18, SpO2 100 %.There is no height or weight on file to calculate BMI.  General Appearance: Casual  Eye Contact:  Fair  Speech:  Clear and Coherent  Volume:  Normal  Mood:  Anxious and Depressed  Affect:  Congruent  Thought Process:  Coherent  Orientation:  Full (Time, Place, and Person)  Thought Content:  WDL  Suicidal Thoughts:  No  Homicidal Thoughts:  No  Memory:  Immediate;   Fair Recent;   Fair  Judgement:  Good  Insight:  Fair  Psychomotor Activity:  Normal  Concentration:  Concentration: Fair  Recall:  [employer] of Knowledge:  Fair  Language:  Fair  Akathisia:  No  Handed:  Right  AIMS (if indicated):     Assets:  Communication Skills Desire for Improvement Social Support Talents/Skills  ADL's:  Intact  Cognition:  WNL  Sleep:        Treatment Plan Summary: Treatment team to collect additional collateral information Continue seeking inpatient admission for geropsychiatry   Derrill Center, NP 06/02/2019, 10:10 AM

## 2019-06-02 NOTE — ED Notes (Signed)
Pharmacy called about sending patients Procardia.

## 2019-06-02 NOTE — ED Notes (Signed)
Pt eating dinner and ate snacks given.

## 2019-06-03 LAB — CBG MONITORING, ED
Glucose-Capillary: 90 mg/dL (ref 70–99)
Glucose-Capillary: 104 mg/dL — ABNORMAL HIGH (ref 70–99)
Glucose-Capillary: 107 mg/dL — ABNORMAL HIGH (ref 70–99)
Glucose-Capillary: 165 mg/dL — ABNORMAL HIGH (ref 70–99)

## 2019-06-03 MED ORDER — CHLORHEXIDINE GLUCONATE CLOTH 2 % EX PADS: 6.0000 | MEDICATED_PAD | Freq: Every day | CUTANEOUS | Status: DC

## 2019-06-03 NOTE — Progress Notes (Signed)
CSW contacted referral facilities with the following results:  Still reviewing: [facility] (waitlist) [facility] (no answer) [facility] (no answer)  Declined: [facility] (medical) [facility] (at capacity)  TTS will continue to seek bed placement.   Chalmers Guest. Guerry Bruin, MSW, [place] Work/Disposition Phone: [phone] Fax: [phone]

## 2019-06-03 NOTE — ED Notes (Signed)
Pt given contacts brought to ED by spouse.

## 2019-06-03 NOTE — ED Notes (Signed)
Pt arrived to Rm 52 via bed. Pt noted to be sleeping at this time. Sitter w/pt.

## 2019-06-03 NOTE — ED Notes (Signed)
2nd message sent to Pharmacy requesting pt's Renvela.

## 2019-06-03 NOTE — ED Notes (Signed)
Pt talking w/Sitter.

## 2019-06-03 NOTE — ED Notes (Addendum)
Previous information mistakenly placed in this patient's chart.

## 2019-06-03 NOTE — ED Notes (Signed)
Telepsych being performed. 

## 2019-06-03 NOTE — Progress Notes (Signed)
[facility] Progress Note   Subjective:   No new issues, remains on [facility]  Talking on phone when I observed.  Objective Vitals:   06/02/19 1541 06/02/19 1836 06/03/19 0047 06/03/19 1024  BP: (!) 129/52 131/75 (!) 148/67 (!) 116/47  Pulse: 66 69 73 66  Resp: $Remo'18 18 18 16  'yxFTo$ Temp: 99.1 F (37.3 C) 98.3 F (36.8 C) 98.7 F (37.1 C) 97.8 F (36.6 C)  TempSrc: Oral Oral Oral Oral  SpO2: 99% 95% 98% 99%   Physical Exam General: well appearing, calm Lungs: normal WOB Extremities: no edema Neuro: nonfocal Psych: calmly talking on phone  Additional Objective Labs: Basic Metabolic Panel: Recent Labs  Lab 06/01/19 2124 06/02/19 1945  NA 140 138  K 4.7 5.0  CL 99 97*  CO2 25 24  GLUCOSE 106* 225*  BUN 67* 88*  CREATININE 7.16* 8.52*  CALCIUM 9.1 9.0  PHOS  --  6.7*   Liver Function Tests: Recent Labs  Lab 06/01/19 2124 06/02/19 1945  AST 25  --   ALT 19  --   ALKPHOS 56  --   BILITOT 0.6  --   PROT 8.4*  --   ALBUMIN 4.5 3.6   No results for input(s): LIPASE, AMYLASE in the last 168 hours. CBC: Recent Labs  Lab 06/01/19 2124  WBC 8.1  NEUTROABS 6.7  HGB 13.2  HCT 42.6  MCV 108.1*  PLT 158   Blood Culture    Component Value Date/Time   SDES BLOOD LEFT HAND 04/04/2018 0915   SPECREQUEST  04/04/2018 0915    BOTTLES DRAWN AEROBIC AND ANAEROBIC Blood Culture adequate volume   CULT  04/04/2018 0915    NO GROWTH 7 DAYS Performed at [facility], [place] [address] [place], [place] [address]    REPTSTATUS 04/11/2018 FINAL 04/04/2018 0915    Cardiac Enzymes: No results for input(s): CKTOTAL, CKMB, CKMBINDEX, TROPONINI in the last 168 hours. CBG: Recent Labs  Lab 06/02/19 1124 06/02/19 1755 06/02/19 2122 06/03/19 0741 06/03/19 1238  GLUCAP 241* 165* 103* 90 104*   Iron Studies: No results for input(s): IRON, TIBC, TRANSFERRIN, FERRITIN in the last 72 hours. $RemoveB'@lablastinr3'LCSzedZR$ @ Studies/Results: No results found. Medications:  .  aspirin EC  81 mg Oral Daily  . atorvastatin  40 mg Oral Daily  . [START ON 06/04/2019] calcitRIOL  0.25 mcg Oral Q M,W,F  . Chlorhexidine Gluconate Cloth  6 each Topical Q0600  . Chlorhexidine Gluconate Cloth  6 each Topical Q0600  . insulin aspart  0-15 Units Subcutaneous TID WC  . insulin aspart  0-5 Units Subcutaneous QHS  . nicotine  21 mg Transdermal Daily  . NIFEdipine  60 mg Oral QHS  . pioglitazone  30 mg Oral Daily  . sertraline  200 mg Oral Daily  . sevelamer carbonate  2,400 mg Oral TID WC    Dialysis Rx: MTRS BFR 400 EDW 75kg AVF  40L FF 54% Heparin 2000u qtx  Assessment/Plan  **ESRD on HHD:  4x/wk on home hemo, we will plan for 3x/wk here.  Plan next HD today or Monday depending on labs and patient volume. UF to EDW.    **IVC: management per psychiatry,   **Anemia of CKD:  Hb acceptable, not on ESA outpt  **BMM:  Cont outpt calcitriol 0.25 TIW.  Monitor corr ca and phos here. sevelamer 2400 TIDAC.   **DM:  On pioglitazone  **HTN: normotensive, cont nifedipine 60 daily  Jannifer Hick MD 06/03/2019, 4:38 PM  [facility] Pager: [phone]

## 2019-06-03 NOTE — ED Notes (Signed)
Pt in shower.  

## 2019-06-03 NOTE — ED Notes (Signed)
Pt aware dialysis will be performed tomorrow, per Nephrologist.

## 2019-06-03 NOTE — ED Notes (Signed)
Pt asking for Tylenol. Informed Becky - RN.
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
## 2019-06-03 NOTE — ED Notes (Signed)
Pt sitting in chair - noted to be sleepy - encouraged pt to lie down on bed - pt follow direction.

## 2019-06-03 NOTE — ED Notes (Signed)
Pt talking w/spouse on phone at nurses' desk.

## 2019-06-03 NOTE — ED Notes (Signed)
Ordered breakfast--Scott Jones 

## 2019-06-04 LAB — RENAL FUNCTION PANEL
Albumin: 3.6 g/dL (ref 3.5–5.0)
Anion gap: 16 — ABNORMAL HIGH (ref 5–15)
BUN: 111 mg/dL — ABNORMAL HIGH (ref 8–23)
CO2: 23 mmol/L (ref 22–32)
Calcium: 8.5 mg/dL — ABNORMAL LOW (ref 8.9–10.3)
Chloride: 100 mmol/L (ref 98–111)
Creatinine, Ser: 10.43 mg/dL — ABNORMAL HIGH (ref 0.61–1.24)
GFR calc Af Amer: 5 mL/min — ABNORMAL LOW (ref >60)
GFR calc non Af Amer: 4 mL/min — ABNORMAL LOW (ref >60)
Glucose, Bld: 110 mg/dL — ABNORMAL HIGH (ref 70–99)
Phosphorus: 8.8 mg/dL — ABNORMAL HIGH (ref 2.5–4.6)
Potassium: 5 mmol/L (ref 3.5–5.1)
Sodium: 139 mmol/L (ref 135–145)

## 2019-06-04 LAB — CBC
HCT: 37.9 % — ABNORMAL LOW (ref 39.0–52.0)
Hemoglobin: 12.3 g/dL — ABNORMAL LOW (ref 13.0–17.0)
MCH: 34.2 pg — ABNORMAL HIGH (ref 26.0–34.0)
MCHC: 32.5 g/dL (ref 30.0–36.0)
MCV: 105.3 fL — ABNORMAL HIGH (ref 80.0–100.0)
Platelets: 154 10*3/uL (ref 150–400)
RBC: 3.6 MIL/uL — ABNORMAL LOW (ref 4.22–5.81)
RDW: 13.8 % (ref 11.5–15.5)
WBC: 7.3 10*3/uL (ref 4.0–10.5)
nRBC: 0 % (ref 0.0–0.2)

## 2019-06-04 LAB — CBG MONITORING, ED
Glucose-Capillary: 99 mg/dL (ref 70–99)
Glucose-Capillary: 114 mg/dL — ABNORMAL HIGH (ref 70–99)
Glucose-Capillary: 98 mg/dL (ref 70–99)

## 2019-06-04 MED ORDER — LIDOCAINE HCL (PF) 1 % IJ SOLN: 5.0000 mL | INTRAMUSCULAR | Status: DC | PRN

## 2019-06-04 MED ORDER — HEPARIN SODIUM (PORCINE) 1000 UNIT/ML DIALYSIS
1000.0000 [IU] | INTRAMUSCULAR | Status: DC | PRN
  Filled 2019-06-04: qty 1

## 2019-06-04 MED ORDER — LIDOCAINE-PRILOCAINE 2.5-2.5 % EX CREA
1.0000 "application " | TOPICAL_CREAM | CUTANEOUS | Status: DC | PRN
  Filled 2019-06-04: qty 5

## 2019-06-04 MED ORDER — PENTAFLUOROPROP-TETRAFLUOROETH EX AERO
1.0000 "application " | INHALATION_SPRAY | CUTANEOUS | Status: DC | PRN
  Filled 2019-06-04: qty 116

## 2019-06-04 MED ORDER — SODIUM CHLORIDE 0.9 % IV SOLN: 100.0000 mL | INTRAVENOUS | Status: DC | PRN

## 2019-06-04 MED ORDER — ACETAMINOPHEN 325 MG PO TABS
ORAL_TABLET | ORAL | Status: AC
  Administered 2019-06-04: 650 mg via ORAL
  Filled 2019-06-04: qty 2

## 2019-06-04 MED ORDER — QUETIAPINE FUMARATE 25 MG PO TABS
25.0000 mg | ORAL_TABLET | Freq: Two times a day (BID) | ORAL | Status: DC
  Administered 2019-06-04 – 2019-06-05 (×3): 25 mg via ORAL
  Filled 2019-06-04 (×3): qty 1

## 2019-06-04 MED ORDER — ALTEPLASE 2 MG IJ SOLR: 2.0000 mg | Freq: Once | INTRAMUSCULAR | Status: DC | PRN

## 2019-06-04 MED ORDER — CALCITRIOL 0.25 MCG PO CAPS
ORAL_CAPSULE | ORAL | Status: AC
  Administered 2019-06-04: 0.25 ug via ORAL
  Filled 2019-06-04: qty 1

## 2019-06-04 MED ORDER — HEPARIN SODIUM (PORCINE) 1000 UNIT/ML DIALYSIS: 2000.0000 [IU] | Freq: Once | INTRAMUSCULAR | Status: DC

## 2019-06-04 NOTE — ED Notes (Signed)
Bfast ordered

## 2019-06-04 NOTE — ED Notes (Signed)
Patient returned from Dialysis.

## 2019-06-04 NOTE — ED Notes (Signed)
Patient is resting comfortably. 

## 2019-06-04 NOTE — ED Notes (Signed)
Pt returned from dialysis

## 2019-06-04 NOTE — ED Notes (Signed)
Patient states he would like staff to be patient with him and he will eventually fall asleep; pt offered PRN meds but states "I'm already on too much medications." pt refused anxiety meds due to "possible" Kidney damage; patient is lying awake in bed at this time-Monique,RN

## 2019-06-04 NOTE — Progress Notes (Signed)
[facility] Progress Note   Subjective:   Seen up in HD this am.  "They won't let me leave". No specific physical c/o's.   Objective Vitals:   06/04/19 0830 06/04/19 0900 06/04/19 0930 06/04/19 0946  BP: 126/64 108/71 137/75 126/70  Pulse: 73 71 74 (!) 53  Resp:    18  Temp:    98.2 F (36.8 C)  TempSrc:    Oral  SpO2:    98%  Weight:    73.4 kg   Physical Exam General: well appearing, calm Lungs: normal WOB Extremities: no edema Neuro: nonfocal Psych: calmly talking on phone  Additional Objective Labs: Basic Metabolic Panel: Recent Labs  Lab 06/01/19 2124 06/02/19 1945 06/04/19 0740  NA 140 138 139  K 4.7 5.0 5.0  CL 99 97* 100  CO2 25 24 23   GLUCOSE 106* 225* 110*  BUN 67* 88* 111*  CREATININE 7.16* 8.52* 10.43*  CALCIUM 9.1 9.0 8.5*  PHOS  --  6.7* 8.8*   Liver Function Tests: Recent Labs  Lab 06/01/19 2124 06/02/19 1945 06/04/19 0740  AST 25  --   --   ALT 19  --   --   ALKPHOS 56  --   --   BILITOT 0.6  --   --   PROT 8.4*  --   --   ALBUMIN 4.5 3.6 3.6   No results for input(s): LIPASE, AMYLASE in the last 168 hours. CBC: Recent Labs  Lab 06/01/19 2124 06/04/19 0500  WBC 8.1 7.3  NEUTROABS 6.7  --   HGB 13.2 12.3*  HCT 42.6 37.9*  MCV 108.1* 105.3*  PLT 158 154   Blood Culture    Component Value Date/Time   SDES BLOOD LEFT HAND 04/04/2018 0915   SPECREQUEST  04/04/2018 0915    BOTTLES DRAWN AEROBIC AND ANAEROBIC Blood Culture adequate volume   CULT  04/04/2018 0915    NO GROWTH 7 DAYS Performed at [facility], [address], [place], [place]    REPTSTATUS 04/11/2018 FINAL 04/04/2018 0915    Cardiac Enzymes: No results for input(s): CKTOTAL, CKMB, CKMBINDEX, TROPONINI in the last 168 hours. CBG: Recent Labs  Lab 06/03/19 0741 06/03/19 1238 06/03/19 1716 06/03/19 2157 06/04/19 1034  GLUCAP 90 104* 107* 165* 99   Iron Studies: No results for input(s): IRON, TIBC, TRANSFERRIN, FERRITIN in  the last 72 hours. @lablastinr3 @ Studies/Results: No results found. Medications: . sodium chloride    . sodium chloride     . aspirin EC  81 mg Oral Daily  . atorvastatin  40 mg Oral Daily  . calcitRIOL  0.25 mcg Oral Q M,W,F  . Chlorhexidine Gluconate Cloth  6 each Topical Q0600  . Chlorhexidine Gluconate Cloth  6 each Topical Q0600  . heparin  2,000 Units Dialysis Once in dialysis  . insulin aspart  0-15 Units Subcutaneous TID WC  . insulin aspart  0-5 Units Subcutaneous QHS  . nicotine  21 mg Transdermal Daily  . NIFEdipine  60 mg Oral QHS  . pioglitazone  30 mg Oral Daily  . QUEtiapine  25 mg Oral BID  . sertraline  200 mg Oral Daily  . sevelamer carbonate  2,400 mg Oral TID WC    Dialysis: [facility] HD  75kg   AVF   EDW 400  40L  FF 54%  Hep 2000  Assessment/Rec: 1. ESRD on HD:  4x/wk on home hemo, we will plan for 3x/wk here.  Plan HD  today then MWF while here.  2. IVC: management per psychiatry  3. Anemia of CKD:  Hb acceptable, not on ESA outpt 4. BMM:  Cont outpt calcitriol 0.25 TIW.  Monitor corr ca and phos here. sevelamer 2400 TIDAC.  5. DM:  On pioglitazone 6. HTN: normotensive, cont nifedipine 60 daily  Kelly Splinter, MD   [facility] 06/04/2019, 11:50 AM

## 2019-06-04 NOTE — BHH Counselor (Signed)
Per [place] at [facility], BMU unable to accept patient today due to staffing.

## 2019-06-04 NOTE — ED Notes (Addendum)
Pt at nurses station and is stating he wants to talk to charge RN about going home. Rocking back and forth in bed. PRN meds given. Breakfast tray heated and given. Pt stating he is needing to speak to Dr. Jannifer Franklin who is head of ER. He is getting a promotion only if he lets him go. RN redirected to him room.

## 2019-06-04 NOTE — Progress Notes (Signed)
Patient is at dialysis at this time and unable to be assessed.  Based on the NP's note from yesterday with the report from the family, he continues to meet criteria for inpatient geriatric psychiatry.  Waylan Boga, PMHNP

## 2019-06-04 NOTE — ED Notes (Signed)
Renal Diet was ordered for Lunch. 

## 2019-06-04 NOTE — Progress Notes (Addendum)
Patient ID: Scott Jones, male   DOB: 1946/02/16, 74 y.o.   MRN: [id number]   Case discussed during bed meeting with [facility] team. Patient continues to met inpatient psychiatric hospitalization. Dr. Dwyane Dee has asked [place] to review patient to see if he is appropriate  for admission to their unit. Dr. Dwyane Dee has recommended starting Seroquel 25 mg po BID for patients psychosis (delusions) . Order has been placed along with an order for an EKG .

## 2019-06-04 NOTE — ED Notes (Addendum)
Morning labs will be drawn in [facility]

## 2019-06-04 NOTE — Progress Notes (Signed)
Pt continues to meet inpatient criteria. CSW contacted the [facility] and assessed that being on dialysis is exclusionary for all [facility].   Disposition will continue to follow.   Audree Camel, LCSW, [place] Disposition [facility] BHH/TTS [phone]

## 2019-06-04 NOTE — ED Notes (Signed)
Wife of patient called asked to speak with the doctor sent a message to doctor with wife's name Curt Bears and phone number [phone]

## 2019-06-04 NOTE — ED Notes (Signed)
Patient asked to call his wife. Patient talking on the phone with his wife.

## 2019-06-04 NOTE — ED Notes (Signed)
Sent message to pharmacy for medications.

## 2019-06-05 ENCOUNTER — Inpatient Hospital Stay
Admission: RE | Admit: 2019-06-05 | Discharge: 2019-06-11 | DRG: 885 | Disposition: A | Discharge status: Home / Self Care | Payer: PPO | Source: Intra-hospital | Attending: Psychiatry | Admitting: Psychiatry

## 2019-06-05 ENCOUNTER — Encounter: Payer: Self-pay | Admitting: Behavioral Health

## 2019-06-05 ENCOUNTER — Other Ambulatory Visit: Payer: Self-pay

## 2019-06-05 DIAGNOSIS — Z905 Acquired absence of kidney: Secondary | ICD-10-CM | POA: Diagnosis not present

## 2019-06-05 DIAGNOSIS — Z992 Dependence on renal dialysis: Secondary | ICD-10-CM | POA: Diagnosis not present

## 2019-06-05 DIAGNOSIS — E785 Hyperlipidemia, unspecified: Secondary | ICD-10-CM | POA: Diagnosis present

## 2019-06-05 DIAGNOSIS — D631 Anemia in chronic kidney disease: Secondary | ICD-10-CM | POA: Diagnosis present

## 2019-06-05 DIAGNOSIS — F29 Unspecified psychosis not due to a substance or known physiological condition: Secondary | ICD-10-CM | POA: Diagnosis not present

## 2019-06-05 DIAGNOSIS — Z8619 Personal history of other infectious and parasitic diseases: Secondary | ICD-10-CM | POA: Diagnosis present

## 2019-06-05 DIAGNOSIS — F322 Major depressive disorder, single episode, severe without psychotic features: Secondary | ICD-10-CM | POA: Diagnosis present

## 2019-06-05 DIAGNOSIS — M810 Age-related osteoporosis without current pathological fracture: Secondary | ICD-10-CM | POA: Diagnosis present

## 2019-06-05 DIAGNOSIS — I10 Essential (primary) hypertension: Secondary | ICD-10-CM | POA: Diagnosis present

## 2019-06-05 DIAGNOSIS — H9190 Unspecified hearing loss, unspecified ear: Secondary | ICD-10-CM | POA: Diagnosis not present

## 2019-06-05 DIAGNOSIS — F329 Major depressive disorder, single episode, unspecified: Secondary | ICD-10-CM | POA: Diagnosis not present

## 2019-06-05 DIAGNOSIS — E440 Moderate protein-calorie malnutrition: Secondary | ICD-10-CM | POA: Diagnosis not present

## 2019-06-05 DIAGNOSIS — E119 Type 2 diabetes mellitus without complications: Secondary | ICD-10-CM | POA: Diagnosis not present

## 2019-06-05 DIAGNOSIS — N186 End stage renal disease: Secondary | ICD-10-CM | POA: Diagnosis present

## 2019-06-05 DIAGNOSIS — E1122 Type 2 diabetes mellitus with diabetic chronic kidney disease: Secondary | ICD-10-CM

## 2019-06-05 DIAGNOSIS — F23 Brief psychotic disorder: Secondary | ICD-10-CM | POA: Diagnosis not present

## 2019-06-05 DIAGNOSIS — I120 Hypertensive chronic kidney disease with stage 5 chronic kidney disease or end stage renal disease: Secondary | ICD-10-CM | POA: Diagnosis not present

## 2019-06-05 DIAGNOSIS — N2581 Secondary hyperparathyroidism of renal origin: Secondary | ICD-10-CM | POA: Diagnosis not present

## 2019-06-05 DIAGNOSIS — Z79899 Other long term (current) drug therapy: Secondary | ICD-10-CM | POA: Diagnosis not present

## 2019-06-05 DIAGNOSIS — M109 Gout, unspecified: Secondary | ICD-10-CM | POA: Diagnosis present

## 2019-06-05 DIAGNOSIS — Z7982 Long term (current) use of aspirin: Secondary | ICD-10-CM

## 2019-06-05 DIAGNOSIS — Z833 Family history of diabetes mellitus: Secondary | ICD-10-CM | POA: Diagnosis not present

## 2019-06-05 DIAGNOSIS — F22 Delusional disorders: Secondary | ICD-10-CM | POA: Diagnosis not present

## 2019-06-05 DIAGNOSIS — J309 Allergic rhinitis, unspecified: Secondary | ICD-10-CM | POA: Diagnosis present

## 2019-06-05 DIAGNOSIS — Z825 Family history of asthma and other chronic lower respiratory diseases: Secondary | ICD-10-CM | POA: Diagnosis not present

## 2019-06-05 DIAGNOSIS — Z841 Family history of disorders of kidney and ureter: Secondary | ICD-10-CM | POA: Diagnosis not present

## 2019-06-05 DIAGNOSIS — T827XXA Infection and inflammatory reaction due to other cardiac and vascular devices, implants and grafts, initial encounter: Secondary | ICD-10-CM | POA: Diagnosis present

## 2019-06-05 DIAGNOSIS — M1030 Gout due to renal impairment, unspecified site: Secondary | ICD-10-CM

## 2019-06-05 DIAGNOSIS — Z8249 Family history of ischemic heart disease and other diseases of the circulatory system: Secondary | ICD-10-CM | POA: Diagnosis not present

## 2019-06-05 DIAGNOSIS — Z7984 Long term (current) use of oral hypoglycemic drugs: Secondary | ICD-10-CM | POA: Diagnosis not present

## 2019-06-05 DIAGNOSIS — N184 Chronic kidney disease, stage 4 (severe): Secondary | ICD-10-CM | POA: Diagnosis present

## 2019-06-05 DIAGNOSIS — I6782 Cerebral ischemia: Secondary | ICD-10-CM | POA: Diagnosis not present

## 2019-06-05 LAB — CBG MONITORING, ED
Glucose-Capillary: 78 mg/dL (ref 70–99)
Glucose-Capillary: 100 mg/dL — ABNORMAL HIGH (ref 70–99)
Glucose-Capillary: 96 mg/dL (ref 70–99)

## 2019-06-05 MED ORDER — QUETIAPINE FUMARATE 25 MG PO TABS
50.0000 mg | ORAL_TABLET | Freq: Two times a day (BID) | ORAL | Status: DC
  Administered 2019-06-06: 50 mg via ORAL
  Filled 2019-06-05 (×2): qty 2

## 2019-06-05 MED ORDER — CHLORHEXIDINE GLUCONATE CLOTH 2 % EX PADS: 6.0000 | MEDICATED_PAD | Freq: Every day | CUTANEOUS | Status: DC

## 2019-06-05 MED ORDER — QUETIAPINE FUMARATE 50 MG PO TABS: 50.0000 mg | ORAL_TABLET | Freq: Two times a day (BID) | ORAL | Status: DC

## 2019-06-05 MED ORDER — SERTRALINE HCL 100 MG PO TABS: 100.0000 mg | ORAL_TABLET | Freq: Every day | ORAL | Status: DC

## 2019-06-05 MED ORDER — ACETAMINOPHEN 325 MG PO TABS
650.0000 mg | ORAL_TABLET | Freq: Four times a day (QID) | ORAL | Status: DC | PRN
  Administered 2019-06-05 – 2019-06-11 (×5): 650 mg via ORAL
  Filled 2019-06-05 (×5): qty 2

## 2019-06-05 MED ORDER — SERTRALINE HCL 100 MG PO TABS
100.0000 mg | ORAL_TABLET | Freq: Every day | ORAL | Status: DC
  Administered 2019-06-06 – 2019-06-11 (×6): 100 mg via ORAL
  Filled 2019-06-05 (×6): qty 1
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
## 2019-06-05 NOTE — Progress Notes (Signed)
  [facility] Progress Note   Subjective:   Seen in ED room, not happy about being here. No SOB , cough or CP.   Objective Vitals:   06/04/19 2239 06/04/19 2241 06/05/19 0548 06/05/19 1209  BP: (!) 106/34 (!) 106/34 118/68 140/64  Pulse:  68 63 67  Resp:  (!) $Re'22 14 18  'XnU$ Temp:  98 F (36.7 C)  98.4 F (36.9 C)  TempSrc:  Oral  Oral  SpO2:  98% 98% 99%  Weight:       Physical Exam General: well appearing, calm Lungs: normal WOB Extremities: no edema Neuro: nonfocal Psych:  RUA AVF+ bruit, 2 button holes visible  Dialysis: [facility] HD  75kg   AVF   EDW 400  40L  FF 54%  Hep 2000  Assessment/Rec: 1. ESRD on HD:  4x/wk on home hemo, we will plan for HD MWF while here. Sessions are somewhat shortened due to heavy pt census.  2. IVC: management per psychiatry  3. Anemia of CKD:  Hb acceptable, not on ESA outpt 4. BMM:  Cont outpt calcitriol 0.25 TIW.  Monitor corr ca and phos here. sevelamer 2400 TIDAC.  5. DM:  On pioglitazone 6. HTN: normotensive, cont nifedipine 60 daily  Kelly Splinter, MD   [facility] 06/05/2019, 4:22 PM  Medications: . sodium chloride    . sodium chloride     . aspirin EC  81 mg Oral Daily  . atorvastatin  40 mg Oral Daily  . calcitRIOL  0.25 mcg Oral Q M,W,F  . Chlorhexidine Gluconate Cloth  6 each Topical Q0600  . Chlorhexidine Gluconate Cloth  6 each Topical Q0600  . heparin  2,000 Units Dialysis Once in dialysis  . insulin aspart  0-15 Units Subcutaneous TID WC  . insulin aspart  0-5 Units Subcutaneous QHS  . NIFEdipine  60 mg Oral QHS  . pioglitazone  30 mg Oral Daily  . QUEtiapine  50 mg Oral BID  . [START ON 06/06/2019] sertraline  100 mg Oral Daily  . sevelamer carbonate  2,400 mg Oral TID WC

## 2019-06-05 NOTE — ED Notes (Signed)
Pt appears to be getting frustrated d/t unable to reach his spouse.

## 2019-06-05 NOTE — ED Notes (Addendum)
Pt ate lunch. Pt asking for staff to contact Dr Jannifer Franklin who is the head emergency dr for [facility] and he can help [place] understand this is a misunderstanding.

## 2019-06-05 NOTE — ED Notes (Signed)
Dialysis MD in w/pt.

## 2019-06-05 NOTE — ED Notes (Signed)
Pt ate dinner. Attempting to call spouse on phone.

## 2019-06-05 NOTE — ED Notes (Signed)
Breakfast Ordered 

## 2019-06-05 NOTE — ED Notes (Signed)
Deputy called and advised he will be en route to pick up pt in approx 1.5-2 hours. Advised will call when approx 30 min out.

## 2019-06-05 NOTE — ED Notes (Signed)
Pt ambulating to nurses' desk asking for phone number to [employer] d/t no answer on spouse's phone. Advised pt unable to locate at this time.

## 2019-06-05 NOTE — BHH Counselor (Signed)
TTS reassessment: Patient is alert and oriented x 2. He appears eager to engage in assessment. He states "I don't need to be here and I want to go home." Patient denies SI/HI/AVH. Patient continues to be delusional about his relationship with Dr. Toy Care. 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 Patient rambles through out assessment and struggles to answer questions appropriately.  Patient continues to meet in patient criteria.

## 2019-06-05 NOTE — Consult Note (Signed)
Reviewed medication: Increase Seroquel to 50 mg BID and decrease Zoloft to 100 mg daily. Continue to recommend inpatient psychiatric admission

## 2019-06-05 NOTE — BH Assessment (Signed)
Patient has been accepted to [facility]  Accepting physician is Dr. Dwyane Dee.  Attending Physician will be Dr. Weber Cooks.  Patient has been assigned to room 310, by [place] Charge Nurse Demetria.  Call report to [phone].  Representative/Transfer Coordinator is Judson Roch ([place]) Patient pre-admitted by [facility] Patient Access Elberta Fortis)

## 2019-06-05 NOTE — Plan of Care (Signed)
Patient new to unit  Problem: Education: Goal: Knowledge of [facility] Education information/materials will improve Outcome: Not Progressing Goal: Emotional status will improve Outcome: Not Progressing Goal: Mental status will improve Outcome: Not Progressing Goal: Verbalization of understanding the information provided will improve Outcome: Not Progressing   Problem: Safety: Goal: Periods of time without injury will increase Outcome: Not Progressing   Problem: Activity: Goal: Will verbalize the importance of balancing activity with adequate rest periods Outcome: Not Progressing   Problem: Safety: Goal: Ability to redirect hostility and anger into socially appropriate behaviors will improve Outcome: Not Progressing Goal: Ability to remain free from injury will improve Outcome: Not Progressing

## 2019-06-06 ENCOUNTER — Ambulatory Visit: Payer: PPO | Attending: Psychiatry

## 2019-06-06 DIAGNOSIS — I6782 Cerebral ischemia: Secondary | ICD-10-CM | POA: Diagnosis not present

## 2019-06-06 DIAGNOSIS — F23 Brief psychotic disorder: Secondary | ICD-10-CM | POA: Diagnosis not present

## 2019-06-06 DIAGNOSIS — E119 Type 2 diabetes mellitus without complications: Secondary | ICD-10-CM | POA: Diagnosis not present

## 2019-06-06 DIAGNOSIS — F29 Unspecified psychosis not due to a substance or known physiological condition: Principal | ICD-10-CM

## 2019-06-06 LAB — RENAL FUNCTION PANEL
Albumin: 4 g/dL (ref 3.5–5.0)
Anion gap: 19 — ABNORMAL HIGH (ref 5–15)
BUN: 96 mg/dL — ABNORMAL HIGH (ref 8–23)
CO2: 27 mmol/L (ref 22–32)
Calcium: 9.5 mg/dL (ref 8.9–10.3)
Chloride: 93 mmol/L — ABNORMAL LOW (ref 98–111)
Creatinine, Ser: 10.03 mg/dL — ABNORMAL HIGH (ref 0.61–1.24)
GFR calc Af Amer: 5 mL/min — ABNORMAL LOW (ref >60)
GFR calc non Af Amer: 5 mL/min — ABNORMAL LOW (ref >60)
Glucose, Bld: 234 mg/dL — ABNORMAL HIGH (ref 70–99)
Phosphorus: 9.8 mg/dL — ABNORMAL HIGH (ref 2.5–4.6)
Potassium: 3.9 mmol/L (ref 3.5–5.1)
Sodium: 139 mmol/L (ref 135–145)

## 2019-06-06 LAB — AMMONIA: Ammonia: 21 umol/L (ref 9–35)

## 2019-06-06 LAB — VITAMIN B12: Vitamin B-12: 394 pg/mL (ref 180–914)

## 2019-06-06 LAB — GLUCOSE, CAPILLARY
Glucose-Capillary: 120 mg/dL — ABNORMAL HIGH (ref 70–99)
Glucose-Capillary: 100 mg/dL — ABNORMAL HIGH (ref 70–99)

## 2019-06-06 LAB — HEPATITIS B SURFACE ANTIGEN: Hepatitis B Surface Ag: NONREACTIVE

## 2019-06-06 LAB — TSH: TSH: 2.714 u[IU]/mL (ref 0.350–4.500)

## 2019-06-06 MED ORDER — HEPARIN SODIUM (PORCINE) 1000 UNIT/ML DIALYSIS
2000.0000 [IU] | Freq: Once | INTRAMUSCULAR | Status: DC
  Filled 2019-06-06: qty 2

## 2019-06-06 MED ORDER — LIDOCAINE HCL (PF) 1 % IJ SOLN
5.0000 mL | INTRAMUSCULAR | Status: DC | PRN
  Filled 2019-06-06: qty 5

## 2019-06-06 MED ORDER — ALTEPLASE 2 MG IJ SOLR
2.0000 mg | Freq: Once | INTRAMUSCULAR | Status: DC | PRN
  Filled 2019-06-06: qty 2

## 2019-06-06 MED ORDER — DOCUSATE SODIUM 100 MG PO CAPS
100.0000 mg | ORAL_CAPSULE | Freq: Two times a day (BID) | ORAL | Status: DC
  Administered 2019-06-06 – 2019-06-11 (×10): 100 mg via ORAL
  Filled 2019-06-06 (×10): qty 1

## 2019-06-06 MED ORDER — RISPERIDONE 1 MG PO TABS
0.5000 mg | ORAL_TABLET | Freq: Every day | ORAL | Status: DC
  Administered 2019-06-06: 0.5 mg via ORAL
  Filled 2019-06-06: qty 1

## 2019-06-06 MED ORDER — HEPARIN SODIUM (PORCINE) 1000 UNIT/ML DIALYSIS
1000.0000 [IU] | INTRAMUSCULAR | Status: DC | PRN
  Filled 2019-06-06: qty 1

## 2019-06-06 MED ORDER — SEVELAMER CARBONATE 800 MG PO TABS
1600.0000 mg | ORAL_TABLET | Freq: Three times a day (TID) | ORAL | Status: DC
  Administered 2019-06-07 – 2019-06-11 (×13): 1600 mg via ORAL
  Filled 2019-06-06 (×16): qty 2

## 2019-06-06 MED ORDER — PENTAFLUOROPROP-TETRAFLUOROETH EX AERO
1.0000 "application " | INHALATION_SPRAY | CUTANEOUS | Status: DC | PRN
  Filled 2019-06-06: qty 30

## 2019-06-06 MED ORDER — ASPIRIN 81 MG PO CHEW
81.0000 mg | CHEWABLE_TABLET | Freq: Every day | ORAL | Status: DC
  Administered 2019-06-06 – 2019-06-11 (×6): 81 mg via ORAL
  Filled 2019-06-06 (×6): qty 1

## 2019-06-06 MED ORDER — NIFEDIPINE ER 60 MG PO TB24
60.0000 mg | ORAL_TABLET | Freq: Every day | ORAL | Status: DC
  Administered 2019-06-06 – 2019-06-11 (×6): 60 mg via ORAL
  Filled 2019-06-06 (×6): qty 1

## 2019-06-06 MED ORDER — SODIUM CHLORIDE 0.9 % IV SOLN: 100.0000 mL | INTRAVENOUS | Status: DC | PRN

## 2019-06-06 MED ORDER — ALLOPURINOL 100 MG PO TABS
100.0000 mg | ORAL_TABLET | Freq: Every day | ORAL | Status: DC
  Administered 2019-06-06 – 2019-06-11 (×6): 100 mg via ORAL
  Filled 2019-06-06 (×6): qty 1

## 2019-06-06 MED ORDER — ATORVASTATIN CALCIUM 20 MG PO TABS
40.0000 mg | ORAL_TABLET | Freq: Every day | ORAL | Status: DC
  Administered 2019-06-06 – 2019-06-10 (×5): 40 mg via ORAL
  Filled 2019-06-06 (×5): qty 2

## 2019-06-06 MED ORDER — PIOGLITAZONE HCL 30 MG PO TABS
30.0000 mg | ORAL_TABLET | Freq: Every day | ORAL | Status: DC
  Administered 2019-06-06 – 2019-06-11 (×6): 30 mg via ORAL
  Filled 2019-06-06 (×6): qty 1

## 2019-06-06 MED ORDER — LIDOCAINE-PRILOCAINE 2.5-2.5 % EX CREA
1.0000 "application " | TOPICAL_CREAM | CUTANEOUS | Status: DC | PRN
  Filled 2019-06-06: qty 5

## 2019-06-06 MED ORDER — CALCITRIOL 0.25 MCG PO CAPS
0.2500 ug | ORAL_CAPSULE | ORAL | Status: DC
  Administered 2019-06-06 – 2019-06-11 (×3): 0.25 ug via ORAL
  Filled 2019-06-06 (×3): qty 1

## 2019-06-06 NOTE — Progress Notes (Signed)
HD Tx completed, tolerated well   06/06/19 1220  Vital Signs  Pulse Rate 71  Pulse Rate Source Monitor  Resp 16  BP 101/62  BP Location Left Arm  BP Method Automatic  Patient Position (if appropriate) Sitting  Oxygen Therapy  SpO2 100 %  O2 Device Room Air  During Hemodialysis Assessment  HD Safety Checks Performed Yes  KECN 52.9 KECN  Dialysis Fluid Bolus Normal Saline  Bolus Amount (mL) 250 mL  Intra-Hemodialysis Comments Tx completed;Tolerated well

## 2019-06-06 NOTE — Progress Notes (Signed)
Post HD Assessment    06/06/19 1230  Neurological  Level of Consciousness Alert  Orientation Level Oriented X4  Respiratory  Respiratory Pattern Regular;Unlabored  Chest Assessment Chest expansion symmetrical  Bilateral Breath Sounds Clear  Cough None  Cardiac  Pulse Regular  Heart Sounds S1, S2  ECG Monitor Yes  Cardiac Rhythm NSR  Vascular  R Radial Pulse +2  L Radial Pulse +2  Edema Generalized  Generalized Edema None  Psychosocial  Psychosocial (WDL) WDL  Patient Behaviors Calm;Cooperative

## 2019-06-06 NOTE — Progress Notes (Signed)
CSW spoke with pt regarding discharge plan, pt declined referrals for mental health stating that "I dont need that, I need to go to a neurologist. I've talked to many of my professional friends about this and they all stated I need a neurologist". Pt reports he already has a referral for [place] neurologist at the end of the month and declined to sign ROI.   Evalina Field, MSW, LCSW Clinical Social Work 06/06/2019 9:45 AM

## 2019-06-06 NOTE — Plan of Care (Signed)
Patient knowledgeable of information received  regarding [place].  Emotional and mental status improved . No safety concerns . Voice no concerns around rest  and wake cycle  No safety concerns . No anger outburst  Or frustrations noted   Problem: Education: Goal: Knowledge of [facility] Education information/materials will improve Outcome: Progressing Goal: Emotional status will improve Outcome: Progressing Goal: Verbalization of understanding the information provided will improve Outcome: Progressing   Problem: Safety: Goal: Periods of time without injury will increase Outcome: Progressing   Problem: Activity: Goal: Will verbalize the importance of balancing activity with adequate rest periods Outcome: Progressing   Problem: Safety: Goal: Ability to redirect hostility and anger into socially appropriate behaviors will improve Outcome: Progressing Goal: Ability to remain free from injury will improve Outcome: Progressing
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
## 2019-06-06 NOTE — BHH Group Notes (Signed)
LCSW Group Therapy Note  06/06/2019 1:00 PM  Type of Therapy/Topic:  Group Therapy:  Emotion Regulation  Participation Level:  Did Not Attend   Description of Group:   The purpose of this group is to assist patients in learning to regulate negative emotions and experience positive emotions. Patients will be guided to discuss ways in which they have been vulnerable to their negative emotions. These vulnerabilities will be juxtaposed with experiences of positive emotions or situations, and patients will be challenged to use positive emotions to combat negative ones. Special emphasis will be placed on coping with negative emotions in conflict situations, and patients will process healthy conflict resolution skills.  Therapeutic Goals: 1. Patient will identify two positive emotions or experiences to reflect on in order to balance out negative emotions 2. Patient will label two or more emotions that they find the most difficult to experience 3. Patient will demonstrate positive conflict resolution skills through discussion and/or role plays  Summary of Patient Progress: X  Therapeutic Modalities:   Cognitive Behavioral Therapy Feelings Identification Dialectical Behavioral Therapy  Assunta Curtis, MSW, LCSW 06/06/2019 12:34 PM

## 2019-06-06 NOTE — Progress Notes (Signed)
Post HD Tx    06/06/19 1225  Vital Signs  Temp (!) 97.4 F (36.3 C)  Temp Source Oral  Pulse Rate 71  Pulse Rate Source Monitor  Resp 16  BP (!) 96/59  BP Location Left Arm  BP Method Automatic  Patient Position (if appropriate) Sitting  Oxygen Therapy  SpO2 100 %  O2 Device Room Air  Pain Assessment  Pain Scale 0-10  Pain Score 0  Dialysis Weight  Weight 73.3 kg  Type of Weight Post-Dialysis  Post-Hemodialysis Assessment  Rinseback Volume (mL) 250 mL  KECN 52.9 V  Dialyzer Clearance Lightly streaked  Duration of HD Treatment -hour(s) 2.5 hour(s)  Hemodialysis Intake (mL) 500 mL  UF Total -Machine (mL) 1891 mL  Net UF (mL) 1391 mL  Tolerated HD Treatment Yes  AVG/AVF Arterial Site Held (minutes) 5 minutes  AVG/AVF Venous Site Held (minutes) 5 minutes  Fistula / Graft Right Forearm Arteriovenous vein graft  Placement Date: 03/30/18   Placed prior to admission: (c) Yes  Orientation: Right  Access Location: Forearm  Access Type: (c) Arteriovenous vein graft  Site Condition No complications  Status Deaccessed  Drainage Description None

## 2019-06-06 NOTE — Progress Notes (Signed)
Pre HD Tx    06/06/19 0945  Neurological  Level of Consciousness Alert  Orientation Level Oriented X4  Respiratory  Respiratory Pattern Regular;Unlabored  Chest Assessment Chest expansion symmetrical  Bilateral Breath Sounds Clear;Diminished  Cough None  Cardiac  Pulse Regular  Heart Sounds S1, S2  ECG Monitor Yes  Cardiac Rhythm NSR  Vascular  R Radial Pulse +2  L Radial Pulse +2  Edema Generalized  Generalized Edema +1  Psychosocial  Psychosocial (WDL) WDL  Patient Behaviors Calm;Cooperative

## 2019-06-06 NOTE — BHH Suicide Risk Assessment (Signed)
[name] INPATIENT:  Family/Significant Other Suicide Prevention Education  Suicide Prevention Education:  Contact Attempts: Corinna Lines, wife [phone] has been identified by the patient as the family member/significant other with whom the patient will be residing, and identified as the person(s) who will aid the patient in the event of a mental health crisis.  With written consent from the patient, two attempts were made to provide suicide prevention education, prior to and/or following the patient's discharge.  We were unsuccessful in providing suicide prevention education.  A suicide education pamphlet was given to the patient to share with family/significant other.  Date and time of first attempt:06/06/19 946am Phone continues to ring, no vm pick up. CSW unable to leave vm. Date and time of second attempt: 2nd attempt needed  Scott Jones Scott Jones 06/06/2019, 9:47 AM

## 2019-06-06 NOTE — Progress Notes (Signed)
Pre HD Tx   06/06/19 0945  Vital Signs  Temp (!) 97.4 F (36.3 C)  Temp Source Oral  Pulse Rate 70  Pulse Rate Source Monitor  Resp 16  BP (!) 142/68  BP Location Left Arm  BP Method Automatic  Patient Position (if appropriate) Sitting  Oxygen Therapy  SpO2 100 %  O2 Device Room Air  Pulse Oximetry Type Continuous  Pain Assessment  Pain Scale 0-10  Pain Score 0  Dialysis Weight  Weight 75 kg  Type of Weight Pre-Dialysis  Time-Out for Hemodialysis  What Procedure? HD  Pt Identifiers(min of two) First/Last Name;MRN/Account#  Correct Site? Yes  Correct Side? Yes  Correct Procedure? Yes  Consents Verified? Yes  Rad Studies Available? N/A  Safety Precautions Reviewed? Yes  Engineer, civil (consulting) Number 6  Station Number 2  UF/Alarm Test Passed  Conductivity: Meter 13.8  Conductivity: Machine  14  pH 7.2  Reverse Osmosis Main   Normal Saline Lot Number [id number]  Dialyzer Lot Number [id number]  Disposable Set Lot Number [id number]  Machine Temperature 98.6 F (37 C)  Musician and Audible Yes  Blood Lines Intact and Secured Yes  Pre Treatment Patient Checks  Vascular access used during treatment Fistula  Patient is receiving dialysis in a chair Yes  Hepatitis B Surface Antigen Results Negative  Date Hepatitis B Surface Antigen Drawn 01/17/19  Hepatitis B Surface Antibody  (>10)  Date Hepatitis B Surface Antibody Drawn 01/17/19  Hemodialysis Consent Verified Yes  Hemodialysis Standing Orders Initiated Yes  ECG (Telemetry) Monitor On Yes  Prime Ordered Normal Saline  Length of  DialysisTreatment -hour(s) 2.5 Hour(s)  Dialysis Treatment Comments Na 140  Dialyzer Other (Comment)  Dialysate 2K;2.5 Ca  Dialysis Anticoagulant None  Dialysate Flow Ordered 600  Blood Flow Rate Ordered 400 mL/min  Ultrafiltration Goal 1.5 Liters  Pre Treatment Labs Renal panel;CBC  Dialysis Blood Pressure Support Ordered Normal Saline  Education / Care Plan  Dialysis Education  Provided Yes  Documented Education in Care Plan Yes  Fistula / Graft Right Forearm Arteriovenous vein graft  Placement Date: 03/30/18   Placed prior to admission: (c) Yes  Orientation: Right  Access Location: Forearm  Access Type: (c) Arteriovenous vein graft  Site Condition No complications  Fistula / Graft Assessment Present;Thrill;Bruit  Status Patent

## 2019-06-06 NOTE — Progress Notes (Signed)
D:Thought Process   A:Patient  Went to Dialysis this am shift .  Able to return without any distress  Or concerns . Conversation  Appropriate with Probation officer.  Voice of wanting to look at the news .   Patient  Noted  To get upset when he could not speak to Dr. Edwyna Perfect . Voice of wanting to leave " I want to go home "  Appetite good at meals.  Limited interaction with peers . Patient denies suicidal thoughts . Appropriate  ADL's and  Personal chores. Thought process remain disorganize  Voice no concerns around  Sleep  No unit participation   Encourage patient participation with unit programming . Instruction  Given on  Medication , verbalize understanding.  R: Voice no other concerns. Staff continue to monitor

## 2019-06-06 NOTE — BHH Suicide Risk Assessment (Signed)
[place] INPATIENT:  Family/Significant Other Suicide Prevention Education  Suicide Prevention Education:  Education Completed; Corinna Lines, wife [phone] has been identified by the patient as the family member/significant other with whom the patient will be residing, and identified as the person(s) who will aid the patient in the event of a mental health crisis (suicidal ideations/suicide attempt).  With written consent from the patient, the family member/significant other has been provided the following suicide prevention education, prior to the and/or following the discharge of the patient.  The suicide prevention education provided includes the following:  Suicide risk factors  Suicide prevention and interventions  National Suicide Hotline telephone number  [facility] assessment telephone number  [facility] Emergency Assistance [place] and/or Residential Mobile Crisis Unit telephone number  Request made of family/significant other to:  Remove weapons (e.g., guns, rifles, knives), all items previously/currently identified as safety concern.    Remove drugs/medications (over-the-counter, prescriptions, illicit drugs), all items previously/currently identified as a safety concern.  The family member/significant other verbalizes understanding of the suicide prevention education information provided.  The family member/significant other agrees to remove the items of safety concern listed above. Mrs. Heide Spark reports the pt began experiencing delusional thinking on 1/8, she states prior to this hospitalization he had not been experiencing changes in mood or behavior. She reports the pt saw Dr. Toy Care for a brief period of time and was referred to a neurologist for further evaluation. She reports the pt "became fixated on Dr. Toy Care, calling her practice numerous times, stating they are business partners, etc." She states the pt receives dialysis. She denies  the pt having access to guns or weapons in the home.   Medea Deines T Janece Laidlaw 06/06/2019, 11:12 AM
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
## 2019-06-06 NOTE — Progress Notes (Signed)
Admission Note:  74 yr male who presents IVC for the treatment of psychosis and Dialysis treatment. Patient's thought on arrival is disorganized and incoherent at times, he was calm and cooperative with admission process. Patient denies SI/HI/AVH but appears delusional , some mild confusion to place and situation. Patient kept ruminating on the fact he shouldn't be here and that [place] long mis treated him.   Patient has Past medical Hx of  Depression, Asthma, Anemia, CKD, DM HOH, HEPATITIS C,and HTN. Patient has fistula in his right arm, writer listened for bruit and felt for thrill, no distress noted. Patient 's skin was assessed and found to be warm, dry he was noted to have a scab on his left elbow. Patient was also  searched and no contraband found, POC and unit policies explained, understanding verbalized and consents obtained. Food and fluids offered, and both was accepted. Patient was oriented to the unit. 15 minutes safety checks maintained will continue to monitor.

## 2019-06-06 NOTE — Consult Note (Signed)
[facility]  CONSULT NOTE    Date: 06/06/2019                  Patient Name:  Scott Jones  MRN: [id number]  DOB: 10-28-1945  Age / Sex: 74 y.o., male         PCP: Copland, Gay Filler, MD                 Service Requesting Consult: Dr. Weber Cooks                 Reason for Consult: End Stage Renal Disease            History of Present Illness: Scott Jones  Transferred from [facility] to [place] for MDD (major depressive disorder), severe [facility]) [F32.2]   Patient gets home hemodialysis administered by his wife on a daily basis. Since his admission to [facility], he has been on a MWF shift.   Consulted for hemodialysis treatment. Placed on hemodialysis treatment. Tolerating treatment well. Uses button holes.    Medications: Outpatient medications: Medications Prior to Admission  Medication Sig Dispense Refill Last Dose  . allopurinol (ZYLOPRIM) 100 MG tablet Take 1 tablet (100 mg total) by mouth daily. 90 tablet 3   . aspirin 81 MG tablet Take 81 mg by mouth daily.     Marland Kitchen atorvastatin (LIPITOR) 40 MG tablet Take 1 tablet (40 mg total) by mouth daily. 90 tablet 3   . B Complex-C-Folic Acid (DIALYVITE TABLET) TABS Take 1 tablet by mouth at bedtime.     . blood glucose meter kit and supplies Pt requesting ONE TOUCH. Use up to 3 times daily as directed. (FOR ICD-10 E10.9, E11.9). 1 each 0   . Blood Glucose Monitoring Suppl (ONE TOUCH ULTRA 2) w/Device KIT Use as directed once a day.  E11.9 1 each 0   . calcitRIOL (ROCALTROL) 0.25 MCG capsule Take 0.25 mcg by mouth 3 (three) times a week.     . docusate sodium (COLACE) 100 MG capsule Take 100 mg by mouth daily.      Marland Kitchen glucose blood test strip Use as directed once a day.  Dx code E11.9 100 each 12   . meloxicam (MOBIC) 7.5 MG tablet Take 1 tablet (7.5 mg total) by mouth daily. (Patient not taking: Reported on 06/02/2019) 30 tablet 3   . methocarbamol (ROBAXIN) 500 MG tablet Take 1 tablet (500 mg total) by mouth  every 8 (eight) hours as needed for muscle spasms. (Patient not taking: Reported on 06/02/2019) 60 tablet 3   . NIFEdipine (ADALAT CC) 60 MG 24 hr tablet Take 1 tablet (60 mg total) by mouth at bedtime. 90 tablet 3   . ONETOUCH DELICA LANCETS 27M MISC 1 each by Does not apply route daily. Dx code E11.9 100 each 1   . pioglitazone (ACTOS) 30 MG tablet Take 1 tablet by mouth once daily (Patient taking differently: Take 30 mg by mouth daily. ) 90 tablet 1   . polyvinyl alcohol (LIQUIFILM TEARS) 1.4 % ophthalmic solution Place 1 drop into both eyes as needed for dry eyes.     Marland Kitchen sertraline (ZOLOFT) 100 MG tablet Take 2 tablets (200 mg total) by mouth daily. 180 tablet 1   . sevelamer carbonate (RENVELA) 800 MG tablet Take 1,600-2,400 mg by mouth See admin instructions. 1691m with snacks 2400 with meals       Current medications: Current Facility-Administered Medications  Medication Dose Route Frequency  Provider Last Rate Last Admin  . 0.9 %  sodium chloride infusion  100 mL Intravenous PRN [name], MD      . 0.9 %  sodium chloride infusion  100 mL Intravenous PRN [name], MD      . acetaminophen (TYLENOL) tablet 650 mg  650 mg Oral Q6H PRN [name], NP   650 mg at 06/05/19 2145  . alteplase (CATHFLO ACTIVASE) injection 2 mg  2 mg Intracatheter Once PRN [name], MD      . heparin injection 1,000 Units  1,000 Units Dialysis PRN [name], MD      . Derrill Memo ON 06/07/2019] heparin injection 2,000 Units  2,000 Units Dialysis Once in dialysis Roney Jaffe, MD      . heparin injection 2,000 Units  2,000 Units Dialysis Once in dialysis [name], MD      . lidocaine (PF) (XYLOCAINE) 1 % injection 5 mL  5 mL Intradermal PRN [name], MD      . lidocaine-prilocaine (EMLA) cream 1 application  1 application Topical PRN [name], MD      . pentafluoroprop-tetrafluoroeth Landry Dyke) aerosol 1 application  1 application Topical PRN [name], MD      . QUEtiapine (SEROQUEL) tablet 50 mg  50 mg Oral BID [name], NP   50 mg at 06/06/19 0756  . sertraline (ZOLOFT) tablet 100 mg  100 mg Oral Daily [name], NP   100 mg at 06/06/19 0756      Allergies: Allergies  Allergen Reactions  . Wellbutrin [Bupropion] Anxiety    Hyper/anxious      Past Medical History: Past Medical History:  Diagnosis Date  . Allergy   . Anemia   . Asthma   . Chronic kidney disease   . Depression   . Deviated septum   . Diabetes mellitus   . Hearing aid worn     B/L  . Hepatitis C   . Hypertension   . Infection 03/2018   RIGHT ARM DIALYSIS CATHETER  . Pneumonia   . Wears glasses      Past Surgical History: Past Surgical History:  Procedure Laterality Date  . AV FISTULA PLACEMENT    . AV FISTULA PLACEMENT Right 09/21/2018   Procedure: ARTERIOVENOUS (AV) FISTULA CREATION RIGHT ARM;  Surgeon: [name], MD;  Location: [place];  Service: Cardiovascular;  Laterality: Right;  . COLONOSCOPY W/ BIOPSIES AND POLYPECTOMY    . EYE SURGERY    . IR FLUORO GUIDE CV LINE RIGHT  03/18/2018  . IR US GUIDE VASC ACCESS RIGHT  03/18/2018  . NEPHRECTOMY     partial - benign tumor  . PARTIAL NEPHRECTOMY  10/08/2011   Left kidney  . TONSILLECTOMY       Family History: Family History  Problem Relation Age of Onset  . Diabetes Mother   . Hypertension Mother   . COPD Mother   . Asthma Mother   . Diabetes Father   . Diabetes Sister   . Diabetes Brother   . Kidney disease Brother      Social History: Social History   Socioeconomic History  . Marital status: Married    Spouse name: Not on file  . Number of children: Not on file  . Years of education: Not on file  . Highest education level: Not on file  Occupational History  . Not on file  Tobacco Use  . Smoking status: Former  Smoker    Packs/day: 1.00    Years: 11.00    Pack years: 11.00    Types: Cigarettes, Pipe    Quit date:  08/07/1974    Years since quitting: 44.8  . Smokeless tobacco: Never Used  Substance and Sexual Activity  . Alcohol use: Yes    Alcohol/week: 1.0 standard drinks    Types: 1 Cans of beer per week    Comment: 1 glass wine weekly  . Drug use: Not Currently    Types: Marijuana    Comment: Marijuana cookie/candy 1x monthly  . Sexual activity: Yes    Birth control/protection: None  Other Topics Concern  . Not on file  Social History Narrative   ** Merged History Encounter **       Social Determinants of Health   Financial Resource Strain:   . Difficulty of Paying Living Expenses: Not on file  Food Insecurity:   . Worried About Charity fundraiser in the Last Year: Not on file  . Ran Out of Food in the Last Year: Not on file  Transportation Needs:   . Lack of Transportation (Medical): Not on file  . Lack of Transportation (Non-Medical): Not on file  Physical Activity:   . Days of Exercise per Week: Not on file  . Minutes of Exercise per Session: Not on file  Stress:   . Feeling of Stress : Not on file  Social Connections:   . Frequency of Communication with Friends and Family: Not on file  . Frequency of Social Gatherings with Friends and Family: Not on file  . Attends Religious Services: Not on file  . Active Member of Clubs or Organizations: Not on file  . Attends Archivist Meetings: Not on file  . Marital Status: Not on file  Intimate Partner Violence:   . Fear of Current or Ex-Partner: Not on file  . Emotionally Abused: Not on file  . Physically Abused: Not on file  . Sexually Abused: Not on file     Review of Systems: Review of Systems  Constitutional: Negative.   HENT: Negative.   Eyes: Negative.   Respiratory: Negative.   Cardiovascular: Negative.   Gastrointestinal: Negative.   Genitourinary: Negative.   Musculoskeletal: Negative.   Skin: Negative.   Neurological: Negative.   Endo/Heme/Allergies: Negative.   Psychiatric/Behavioral: Negative.      Vital Signs: Blood pressure (!) 142/76, pulse 68, temperature 97.7 F (36.5 C), temperature source Oral, resp. rate 17, height _0  (1.651 m), weight 73.4 kg, SpO2 100 %.  Weight trends: Filed Weights   06/05/19 2059  Weight: 73.4 kg    Physical Exam: General: NAD, seated in chair  Head: Normocephalic, atraumatic. Moist oral mucosal membranes  Eyes: Anicteric, PERRL  Neck: Supple, trachea midline  Lungs:  Clear to auscultation  Heart: Regular rate and rhythm  Abdomen:  Soft, nontender,   Extremities:  no peripheral edema.  Neurologic: Nonfocal, moving all four extremities  Skin: No lesions  Access: Left AVF with button holes     Lab results: Basic Metabolic Panel: Recent Labs  Lab 06/02/19 1945 06/04/19 0740 06/06/19 1004  NA 138 139 139  K 5.0 5.0 3.9  CL 97* 100 93*  CO2 _1 GLUCOSE 225* 110* 234*  BUN 88* 111* 96*  CREATININE 8.52* 10.43* 10.03*  CALCIUM 9.0 8.5* 9.5  PHOS 6.7* 8.8* 9.8*    Liver Function Tests: Recent Labs  Lab 06/01/19 2124 06/02/19 1945 06/04/19 0740 06/06/19  1004  AST 25  --   --   --   ALT 19  --   --   --   ALKPHOS 56  --   --   --   BILITOT 0.6  --   --   --   PROT 8.4*  --   --   --   ALBUMIN 4.5 3.6 3.6 4.0   No results for input(s): LIPASE, AMYLASE in the last 168 hours. No results for input(s): AMMONIA in the last 168 hours.  CBC: Recent Labs  Lab 06/01/19 2124 06/04/19 0500  WBC 8.1 7.3  NEUTROABS 6.7  --   HGB 13.2 12.3*  HCT 42.6 37.9*  MCV 108.1* 105.3*  PLT 158 154    Cardiac Enzymes: No results for input(s): CKTOTAL, CKMB, CKMBINDEX, TROPONINI in the last 168 hours.  BNP: Invalid input(s): POCBNP  CBG: Recent Labs  Lab 06/04/19 2237 06/05/19 0748 06/05/19 1246 06/05/19 1743 06/06/19 1236  GLUCAP 98 78 100* 96 120*    Microbiology: Results for orders placed or performed during the hospital encounter of 06/01/19  Respiratory Panel by RT PCR (Flu A&B, Covid) - Nasopharyngeal Swab      Status: None   Collection Time: 06/01/19  9:52 PM   Specimen: Nasopharyngeal Swab  Result Value Ref Range Status   SARS Coronavirus 2 by RT PCR NEGATIVE NEGATIVE Final    Comment: (NOTE) SARS-CoV-2 target nucleic acids are NOT DETECTED. The SARS-CoV-2 RNA is generally detectable in upper respiratoy specimens during the acute phase of infection. The lowest concentration of SARS-CoV-2 viral copies this assay can detect is 131 copies/mL. A negative result does not preclude SARS-Cov-2 infection and should not be used as the sole basis for treatment or other patient management decisions. A negative result may occur with  improper specimen collection/handling, submission of specimen other than nasopharyngeal swab, presence of viral mutation(s) within the areas targeted by this assay, and inadequate number of viral copies (<131 copies/mL). A negative result must be combined with clinical observations, patient history, and epidemiological information. The expected result is Negative. Fact Sheet for Patients:  PinkCheek.be Fact Sheet for Healthcare Providers:  GravelBags.it This test is not yet ap proved or cleared by the [place] FDA and  has been authorized for detection and/or diagnosis of SARS-CoV-2 by FDA under an Emergency Use Authorization (EUA). This EUA will remain  in effect (meaning this test can be used) for the duration of the COVID-19 declaration under Section 564(b)(1) of the Act, 21 U.S.C. section 360bbb-3(b)(1), unless the authorization is terminated or revoked sooner.    Influenza A by PCR NEGATIVE NEGATIVE Final   Influenza B by PCR NEGATIVE NEGATIVE Final    Comment: (NOTE) The Xpert Xpress SARS-CoV-2/FLU/RSV assay is intended as an aid in  the diagnosis of influenza from Nasopharyngeal swab specimens and  should not be used as a sole basis for treatment. Nasal washings and  aspirates are unacceptable  for Xpert Xpress SARS-CoV-2/FLU/RSV  testing. Fact Sheet for Patients: PinkCheek.be Fact Sheet for Healthcare Providers: GravelBags.it This test is not yet approved or cleared by the [place] FDA and  has been authorized for detection and/or diagnosis of SARS-CoV-2 by  FDA under an Emergency Use Authorization (EUA). This EUA will remain  in effect (meaning this test can be used) for the duration of the  Covid-19 declaration under Section 564(b)(1) of the Act, 21  U.S.C. section 360bbb-3(b)(1), unless the authorization is  terminated or revoked. Performed at [facility], [place] [address] [place], [place] [address]     Coagulation Studies: No results for input(s): LABPROT, INR in the last 72 hours.  Urinalysis: No results for input(s): COLORURINE, LABSPEC, PHURINE, GLUCOSEU, HGBUR, BILIRUBINUR, KETONESUR, PROTEINUR, UROBILINOGEN, NITRITE, LEUKOCYTESUR in the last 72 hours.  Invalid input(s): APPERANCEUR    Imaging:  No results found.   Assessment & Plan: Scott Jones is a 74 y.o. white male with end stage renal disease on home hemodialysis, hepatitis C, hypertension, hearing loss with hearing aide, diabetes mellitus type II, depression, who was admitted to [facility] on 06/05/2019 for depression  [facility]) Home Hemodialysis  1. End Stage Renal Disease: seen and examined on hemodialysis treatment.   2. Anemia of chronic kidney disease: macrocytic. Hemoglobin 12.3. No indication for ESA.   3. Hypertension: blood pressure well controlled. Currently off all blood pressure agents.   4. Secondary Hyperparathyroidism:  - sevelamer with meals.      LOS: 1 Scott Jones 1/13/20213:21 PM

## 2019-06-06 NOTE — Progress Notes (Signed)
Recreation Therapy Notes    Date: 06/06/2019  Time: 9:30 am   Location: Craft room   Behavioral response: N/A   Intervention Topic: Relaxation   Discussion/Intervention: Patient did not attend group.   Clinical Observations/Feedback:  Patient did not attend group.   Syan Cullimore LRT/CTRS        Tess Potts 06/06/2019 11:08 AM

## 2019-06-06 NOTE — Tx Team (Signed)
Initial Treatment Plan 06/06/2019 5:03 AM [place] [id number]    PATIENT STRESSORS: Health problems Medication change or noncompliance   PATIENT STRENGTHS: General fund of knowledge Motivation for treatment/growth Religious Affiliation Supportive family/friends   PATIENT IDENTIFIED PROBLEMS: Psychosis                     DISCHARGE CRITERIA:  Improved stabilization in mood, thinking, and/or behavior Medical problems require only outpatient monitoring Motivation to continue treatment in a less acute level of care  PRELIMINARY DISCHARGE PLAN: Outpatient therapy  PATIENT/FAMILY INVOLVEMENT: This treatment plan has been presented to and reviewed with the patient, Scott Jones, The patient and family have been given the opportunity to ask questions and make suggestions.  Harl Bowie, RN 06/06/2019, 5:03 AM

## 2019-06-06 NOTE — Progress Notes (Signed)
Patient was on the phone upon arrival to the unit. Patient was pleasant during assessment denying SI/HI/AVH, pain, anxiety and depression. Patient still waiting on an MRI, patient given education. Patient compliant with medications but did refuse his midnight dose of Heparin, saying, "I only take that when I go to dialysis." Patient given education, patient still refused. Patient observed interacting appropriately with staff and peers on the unit. Patient being monitored Q 15 minutes for safety per unit protocol. Patient remains safe on the unit.

## 2019-06-06 NOTE — H&P (Signed)
Psychiatric Admission Assessment Adult  Patient Identification: Scott Jones MRN:  [id number] Date of Evaluation:  06/06/2019 Chief Complaint:  MDD (major depressive disorder), severe ([place]) [F32.2] Principal Diagnosis: Psychosis ([place]) Diagnosis:  Principal Problem:   Psychosis ([place]) Active Problems:   DM (diabetes mellitus) ([place])   HTN (hypertension)   Allergic rhinitis   History of hepatitis C   Dyslipidemia   Chronic kidney disease (CKD), stage IV (severe) (HCC)   H/O partial nephrectomy   Acute gouty arthritis   ESRD (end stage renal disease) ([place])   Infection of AV graft for dialysis ([place])   Malnutrition of moderate degree   Osteoporosis   MDD (major depressive disorder), severe (HCC)  History of Present Illness: 74 year old man transferred to [place] from [place].  He was brought to the hospital there several days ago after an incident in which he broke into the home of a stranger believing that he had a right to be there.  This resulted in an argument with the owner and led to the police being called.  Patient's wife was present at the time and saw that he was still remaining paranoid and delusional.  History was obtained today from both the wife and the patient.  Wife reports there has been an acute change in his symptoms in the past 1 week.  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  He became fixated on this.  Apparently according to him the reason he broke into the house was because he believed it to belong to that doctor and that somehow he needed to reach her.  Family had also noticed that he is memory seem to be getting worse.  He was losing his telephone more often.  Prior to that the wife notices that starting in November he began to claim that he was having "dj vu" episodes.  He describes it to me and she describes it as well that he would watch a television show and when it got to the end he  would insist that he remembered a completely different ending to the show.  It sounds rather benign but it sounds like it also was a clear change from baseline.  Patient repeats that to me and also talks now about having premonitions and constantly feeling like what ever he is doing he is already done before.  Patient does have significant medical problems.  He is on home hemodialysis has diabetes and high blood pressure.  However there was no acute illness or injury.  He does not use alcohol or drugs.  Evidently he was on a serotonin reuptake inhibitor for a diagnosis of PTSD but this has been a longstanding thing.  He recently saw Dr. Toy Care, who actually is a family friend, in consultation for these episodes but she had referred him to a neurologist for further evaluation.  He has not yet seen the neurologist. Associated Signs/Symptoms: Depression Symptoms:  insomnia, difficulty concentrating, (Hypo) Manic Symptoms:  Flight of Ideas, Anxiety Symptoms:  Nothing specific Psychotic Symptoms:  Delusions, Ideas of Reference, Paranoia, PTSD Symptoms: Had a traumatic exposure:  Patient is reported to have a diagnosis of PTSD related to a traumatic childhood however he has never been hospitalized has no history of suicide attempts or violence and it sounds like his symptoms such as they are have been only minimally impairing and have been treated mainly with therapy. Total Time spent with patient: 1  hour  Past Psychiatric History: As noted previously patient had a diagnosis of PTSD and has been on modest dose SSRI.  No history of violence no history of suicidality no previous history of any psychotic symptoms.  Is the patient at risk to self? No.  Has the patient been a risk to self in the past 6 months? No.  Has the patient been a risk to self within the distant past? No.  Is the patient a risk to others? No.  Has the patient been a risk to others in the past 6 months? No.  Has the patient been a risk  to others within the distant past? No.   Prior Inpatient Therapy:   Prior Outpatient Therapy:    Alcohol Screening: 1. How often do you have a drink containing alcohol?: Never 2. How many drinks containing alcohol do you have on a typical day when you are drinking?: 1 or 2 3. How often do you have six or more drinks on one occasion?: Never AUDIT-C Score: 0 4. How often during the last year have you found that you were not able to stop drinking once you had started?: Never 5. How often during the last year have you failed to do what was normally expected from you becasue of drinking?: Never 6. How often during the last year have you needed a first drink in the morning to get yourself going after a heavy drinking session?: Never 7. How often during the last year have you had a feeling of guilt of remorse after drinking?: Never 8. How often during the last year have you been unable to remember what happened the night before because you had been drinking?: Never 9. Have you or someone else been injured as a result of your drinking?: No 10. Has a relative or friend or a doctor or another health worker been concerned about your drinking or suggested you cut down?: No Alcohol Use Disorder Identification Test Final Score (AUDIT): 0 Alcohol Brief Interventions/Follow-up: AUDIT Score <7 follow-up not indicated Substance Abuse History in the last 12 months:  No. Consequences of Substance Abuse: Negative Previous Psychotropic Medications: Yes  Psychological Evaluations: Yes  Past Medical History:  Past Medical History:  Diagnosis Date  . Allergy   . Anemia   . Asthma   . Chronic kidney disease   . Depression   . Deviated septum   . Diabetes mellitus   . Hearing aid worn     B/L  . Hepatitis C   . Hypertension   . Infection 03/2018   RIGHT ARM DIALYSIS CATHETER  . Pneumonia   . Wears glasses     Past Surgical History:  Procedure Laterality Date  . AV FISTULA PLACEMENT    . AV FISTULA  PLACEMENT Right 09/21/2018   Procedure: ARTERIOVENOUS (AV) FISTULA CREATION RIGHT ARM;  Surgeon: [name], MD;  Location: [facility];  Service: Cardiovascular;  Laterality: Right;  . COLONOSCOPY W/ BIOPSIES AND POLYPECTOMY    . EYE SURGERY    . IR FLUORO GUIDE CV LINE RIGHT  03/18/2018  . IR US GUIDE VASC ACCESS RIGHT  03/18/2018  . NEPHRECTOMY     partial - benign tumor  . PARTIAL NEPHRECTOMY  10/08/2011   Left kidney  . TONSILLECTOMY     Family History:  Family History  Problem Relation Age of Onset  . Diabetes Mother   . Hypertension Mother   . COPD Mother   . Asthma Mother   . Diabetes Father   .  Diabetes Sister   . Diabetes Brother   . Kidney disease Brother    Family Psychiatric  History: Patient does not report any significant family history Tobacco Screening: Have you used any form of tobacco in the last 30 days? (Cigarettes, Smokeless Tobacco, Cigars, and/or Pipes): No Social History:  Social History   Substance and Sexual Activity  Alcohol Use Yes  . Alcohol/week: 1.0 standard drinks  . Types: 1 Cans of beer per week   Comment: 1 glass wine weekly     Social History   Substance and Sexual Activity  Drug Use Not Currently  . Types: Marijuana   Comment: Marijuana cookie/candy 1x monthly    Additional Social History: Marital status: Married Number of Years Married: 25 What is your sexual orientation?: heterosexual Does patient have children?: Yes How many children?: 2 How is patient's relationship with their children?: Pt reports he has twin daughters who are 44 years old                         Allergies:   Allergies  Allergen Reactions  . Wellbutrin [Bupropion] Anxiety    Hyper/anxious   Lab Results:  Results for orders placed or performed during the hospital encounter of 06/05/19 (from the past 48 hour(s))  Renal function panel     Status: Abnormal   Collection Time: 06/06/19 10:04 AM  Result Value Ref Range   Sodium 139 135  - 145 mmol/L   Potassium 3.9 3.5 - 5.1 mmol/L   Chloride 93 (L) 98 - 111 mmol/L   CO2 27 22 - 32 mmol/L   Glucose, Bld 234 (H) 70 - 99 mg/dL   BUN 96 (H) 8 - 23 mg/dL   Creatinine, Ser 10.03 (H) 0.61 - 1.24 mg/dL   Calcium 9.5 8.9 - 10.3 mg/dL   Phosphorus 9.8 (H) 2.5 - 4.6 mg/dL   Albumin 4.0 3.5 - 5.0 g/dL   GFR calc non Af Amer 5 (L) >60 mL/min   GFR calc Af Amer 5 (L) >60 mL/min   Anion gap 19 (H) 5 - 15    Comment: Performed at [facility], [address] [place], [place] [address]  Hepatitis B surface antigen     Status: None   Collection Time: 06/06/19 10:04 AM  Result Value Ref Range   Hepatitis B Surface Ag NON REACTIVE NON REACTIVE    Comment: Performed at [facility], [address] [place], [place] [address]  Glucose, capillary     Status: Abnormal   Collection Time: 06/06/19 12:36 PM  Result Value Ref Range   Glucose-Capillary 120 (H) 70 - 99 mg/dL   Comment 1 Notify RN   Glucose, capillary     Status: Abnormal   Collection Time: 06/06/19  4:40 PM  Result Value Ref Range   Glucose-Capillary 100 (H) 70 - 99 mg/dL    Blood Alcohol level:  Lab Results  Component Value Date   ETH <10 48/18/5631    Metabolic Disorder Labs:  Lab Results  Component Value Date   HGBA1C 5.2 06/01/2019   MPG 102.54 06/01/2019   MPG 105 07/26/2016   No results found for: PROLACTIN Lab Results  Component Value Date   CHOL 131 07/26/2016   TRIG 137.0 07/26/2016   HDL 38.90 (L) 07/26/2016   CHOLHDL 3 07/26/2016   VLDL 27.4 07/26/2016   LDLCALC 65 07/26/2016   LDLCALC 92 08/07/2015    Current Medications: Current Facility-Administered Medications  Medication Dose Route Frequency Provider Last Rate Last Admin  . 0.9 %  sodium chloride infusion  100 mL Intravenous PRN [name], MD      . 0.9 %  sodium chloride infusion  100 mL Intravenous PRN [name], MD      . acetaminophen (TYLENOL) tablet 650 mg  650 mg Oral Q6H PRN [name], NP   650 mg at 06/05/19 2145  . alteplase (CATHFLO ACTIVASE) injection 2 mg  2 mg Intracatheter Once PRN [name], MD      . heparin injection 1,000 Units  1,000 Units Dialysis PRN [name], MD      . Derrill Memo ON 06/07/2019] heparin injection 2,000 Units  2,000 Units Dialysis Once in dialysis Roney Jaffe, MD      . heparin injection 2,000 Units  2,000 Units Dialysis Once in dialysis [name], MD      . lidocaine (PF) (XYLOCAINE) 1 % injection 5 mL  5 mL Intradermal PRN [name], MD      . lidocaine-prilocaine (EMLA) cream 1 application  1 application Topical PRN [name], MD      . pentafluoroprop-tetrafluoroeth Landry Dyke) aerosol 1 application  1 application Topical PRN [name], MD      . risperiDONE (RISPERDAL) tablet 0.5 mg  0.5 mg Oral QHS Yamato Kopf T, MD      . sertraline (ZOLOFT) tablet 100 mg  100 mg Oral Daily [name], NP   100 mg at 06/06/19 0756  . sevelamer carbonate (RENVELA) tablet 1,600 mg  1,600 mg Oral TID WC Kolluru, Sarath, MD       PTA Medications: Medications Prior to Admission  Medication Sig Dispense Refill Last Dose  . allopurinol (ZYLOPRIM) 100 MG tablet Take 1 tablet (100 mg total) by mouth daily. 90 tablet 3   . aspirin 81 MG tablet Take 81 mg by mouth daily.     Marland Kitchen atorvastatin (LIPITOR) 40 MG tablet Take 1 tablet (40 mg total) by mouth daily. 90 tablet 3   . B Complex-C-Folic Acid (DIALYVITE TABLET) TABS Take 1 tablet by mouth at bedtime.     . blood glucose meter kit and supplies Pt requesting ONE TOUCH. Use up to 3 times daily as directed. (FOR ICD-10 E10.9, E11.9). 1 each 0   . Blood Glucose Monitoring Suppl (ONE TOUCH ULTRA 2) w/Device KIT Use as directed once a day.  E11.9 1 each 0   . calcitRIOL (ROCALTROL) 0.25 MCG capsule Take 0.25 mcg by mouth 3 (three) times a week.     . docusate sodium (COLACE) 100 MG capsule Take 100 mg by mouth daily.      Marland Kitchen glucose blood test strip Use  as directed once a day.  Dx code E11.9 100 each 12   . meloxicam (MOBIC) 7.5 MG tablet Take 1 tablet (7.5 mg total) by mouth daily. (Patient not taking: Reported on 06/02/2019) 30 tablet 3   . methocarbamol (ROBAXIN) 500 MG tablet Take 1 tablet (500 mg total) by mouth every 8 (eight) hours as needed for muscle spasms. (Patient not taking: Reported on 06/02/2019) 60 tablet 3   . NIFEdipine (ADALAT CC) 60 MG 24 hr tablet Take 1 tablet (60 mg total) by mouth at bedtime. 90 tablet 3   . ONETOUCH DELICA LANCETS 23F MISC 1 each by Does not apply route daily. Dx code E11.9 100 each 1   . pioglitazone (ACTOS) 30 MG tablet Take 1  tablet by mouth once daily (Patient taking differently: Take 30 mg by mouth daily. ) 90 tablet 1   . polyvinyl alcohol (LIQUIFILM TEARS) 1.4 % ophthalmic solution Place 1 drop into both eyes as needed for dry eyes.     Marland Kitchen sertraline (ZOLOFT) 100 MG tablet Take 2 tablets (200 mg total) by mouth daily. 180 tablet 1   . sevelamer carbonate (RENVELA) 800 MG tablet Take 1,600-2,400 mg by mouth See admin instructions. 1661m with snacks 2400 with meals       Musculoskeletal: Strength & Muscle Tone: within normal limits Gait & Station: normal Patient leans: N/A  Psychiatric Specialty Exam: Physical Exam  Nursing note and vitals reviewed. Constitutional: He appears well-developed and well-nourished.  HENT:  Head: Normocephalic and atraumatic.  Eyes: Pupils are equal, round, and reactive to light. Conjunctivae are normal.  Cardiovascular: Regular rhythm and normal heart sounds.  Respiratory: Effort normal.  GI: Soft.  Musculoskeletal:        General: Normal range of motion.     Cervical back: Normal range of motion.  Neurological: He is alert.  Skin: Skin is warm and dry.  Psychiatric: His speech is normal and behavior is normal. His mood appears anxious. Thought content is paranoid and delusional. Cognition and memory are normal. He expresses inappropriate judgment. He does not  express impulsivity. He expresses no homicidal and no suicidal ideation.    Review of Systems  Constitutional: Negative.   HENT: Negative.   Eyes: Negative.   Respiratory: Negative.   Cardiovascular: Negative.   Gastrointestinal: Negative.   Musculoskeletal: Negative.   Skin: Negative.   Neurological: Negative.   Psychiatric/Behavioral: Positive for sleep disturbance.    Blood pressure (!) 142/76, pulse 68, temperature 97.7 F (36.5 C), temperature source Oral, resp. rate 17, height _0  (1.651 m), weight 73.4 kg, SpO2 100 %.Body mass index is 26.93 kg/m.  General Appearance: Casual  Eye Contact:  Fair  Speech:  Clear and Coherent  Volume:  Increased  Mood:  Euthymic  Affect:  Constricted  Thought Process:  Disorganized  Orientation:  Full (Time, Place, and Person)  Thought Content:  Illogical, Delusions and Paranoid Ideation  Suicidal Thoughts:  No  Homicidal Thoughts:  No  Memory:  Immediate;   Fair Recent;   Fair Remote;   Fair  Judgement:  Impaired  Insight:  Shallow  Psychomotor Activity:  Normal  Concentration:  Concentration: Fair  Recall:  [employer] Corporationof Knowledge:  Fair  Language:  Fair  Akathisia:  No  Handed:  Right  AIMS (if indicated):     Assets:  Communication Skills Desire for Improvement Housing Resilience Social Support  ADL's:  Impaired  Cognition:  Impaired,  Mild  Sleep:  Number of Hours: 3.75    Treatment Plan Summary: Daily contact with patient to assess and evaluate symptoms and progress in treatment, Medication management and Plan This is a 74year old man with no significant past psychiatric history who presents with 1 week of acute delusions and may be a month or 2 of other odd thought distortions.  Wife and daughters both say that they think he may have been having some memory problems for the past couple of years but it had not gotten to the point of any intervention.  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  At the end of the interview I told him that what he was believing was  not really true and that that was why he was in the hospital.  He was actually willing to consider that and looked puzzled about it saying if it were not true then he knew there was something wrong with him.  This was a pretty notable bit of insight I thought.  As I told the patient, it is best to assume that in someone his age with new onset mental status changes especially one who already has medical problems we should first look for all medical causes.  I have ordered an MRI of the brain.  I have ordered some additional blood tests including TSH parathyroid hormone RPR vitamin B12.  I will also start a small dose of Risperdal at night.  It is very noticeable that I performed a regular Mini-Mental Status exam with the patient and he did almost perfectly.  He got a 29 out of 30 only mistaking the date by a single day.  This would certainly not be consistent with any advanced dementia but perhaps with more of just new focal change causing the psychotic symptoms.  If we cannot find any medical cause we may fall back on considering this a delusional disorder but I am still hoping he will not need to have a long length of stay.  Observation Level/Precautions:  15 minute checks  Laboratory:  Chemistry Profile  Psychotherapy:    Medications:    Consultations:    Discharge Concerns:    Estimated LOS:  Other:     Physician Treatment Plan for Primary Diagnosis: Psychosis ([place]) Long Term Goal(s): Improvement in symptoms so as ready for discharge  Short Term Goals: Ability to demonstrate self-control will improve  Physician Treatment Plan for Secondary Diagnosis: Principal Problem:   Psychosis ([place]) Active Problems:   DM (diabetes mellitus) ([place])   HTN (hypertension)   Allergic rhinitis   History of hepatitis C   Dyslipidemia   Chronic kidney disease (CKD), stage IV  (severe) ([place])   H/O partial nephrectomy   Acute gouty arthritis   ESRD (end stage renal disease) ([place])   Infection of AV graft for dialysis ([place])   Malnutrition of moderate degree   Osteoporosis   MDD (major depressive disorder), severe ([place])  Long Term Goal(s): Improvement in symptoms so as ready for discharge  Short Term Goals: Ability to maintain clinical measurements within normal limits will improve and Compliance with prescribed medications will improve  I certify that inpatient services furnished can reasonably be expected to improve the patient's condition.    Alethia Berthold, MD 1/13/20215:02 PM

## 2019-06-06 NOTE — Plan of Care (Signed)
Patient presents the same as last night, confused and slightly agitated at being here.   Problem: Education: Goal: Emotional status will improve Outcome: Not Progressing Goal: Mental status will improve Outcome: Not Progressing

## 2019-06-06 NOTE — Progress Notes (Signed)
HD Tx started    06/06/19 0950  Hand-Off documentation  Report given to (Full Name) Beatris Ship, RN   Report received from (Full Name) Polly Cobia, RN   Vital Signs  Temp (!) 97.4 F (36.3 C)  Temp Source Oral  Pulse Rate 70  Pulse Rate Source Monitor  Resp 16  BP 139/65  BP Location Left Arm  BP Method Automatic  Patient Position (if appropriate) Sitting  During Hemodialysis Assessment  Blood Flow Rate (mL/min) 400 mL/min  Arterial Pressure (mmHg) -160 mmHg  Venous Pressure (mmHg) 130 mmHg  Transmembrane Pressure (mmHg) 60 mmHg  Ultrafiltration Rate (mL/min) 800 mL/min  Dialysate Flow Rate (mL/min) 600 ml/min  Conductivity: Machine  14  HD Safety Checks Performed Yes  Dialysis Fluid Bolus Normal Saline  Bolus Amount (mL) 250 mL  Fistula / Graft Right Forearm Arteriovenous vein graft  Placement Date: 03/30/18   Placed prior to admission: (c) Yes  Orientation: Right  Access Location: Forearm  Access Type: (c) Arteriovenous vein graft  Status Accessed  Needle Size 15g (Sharp Needle, no buttonholes available )

## 2019-06-07 DIAGNOSIS — F23 Brief psychotic disorder: Secondary | ICD-10-CM | POA: Diagnosis not present

## 2019-06-07 LAB — GLUCOSE, CAPILLARY
Glucose-Capillary: 92 mg/dL (ref 70–99)
Glucose-Capillary: 93 mg/dL (ref 70–99)
Glucose-Capillary: 137 mg/dL — ABNORMAL HIGH (ref 70–99)
Glucose-Capillary: 85 mg/dL (ref 70–99)

## 2019-06-07 LAB — RPR: RPR Ser Ql: NONREACTIVE

## 2019-06-07 LAB — PARATHYROID HORMONE, INTACT (NO CA): PTH: 171 pg/mL — ABNORMAL HIGH (ref 15–65)

## 2019-06-07 MED ORDER — RISPERIDONE 1 MG PO TABS
0.5000 mg | ORAL_TABLET | Freq: Every day | ORAL | Status: DC
  Administered 2019-06-07 – 2019-06-11 (×3): 0.5 mg via ORAL
  Filled 2019-06-07 (×4): qty 1

## 2019-06-07 MED ORDER — RISPERIDONE 1 MG PO TABS
1.0000 mg | ORAL_TABLET | Freq: Every day | ORAL | Status: DC
  Administered 2019-06-10: 1 mg via ORAL
  Filled 2019-06-07 (×2): qty 1

## 2019-06-07 NOTE — Progress Notes (Signed)
Recreation Therapy Notes    Date: 06/07/2019  Time: 9:30 am   Location: Craft room   Behavioral response: N/A   Intervention Topic: Goals   Discussion/Intervention: Patient did not attend group.   Clinical Observations/Feedback:  Patient did not attend group.   Jacquie Lukes LRT/CTRS         Immanuel Fedak 06/07/2019 11:42 AM

## 2019-06-07 NOTE — BHH Group Notes (Signed)
LCSW Group Therapy Note  06/07/2019 11:37 AM  Type of Therapy/Topic:  Group Therapy:  Balance in Life  Participation Level:  Active  Description of Group:    This group will address the concept of balance and how it feels and looks when one is unbalanced. Patients will be encouraged to process areas in their lives that are out of balance and identify reasons for remaining unbalanced. Facilitators will guide patients in utilizing problem-solving interventions to address and correct the stressor making their life unbalanced. Understanding and applying boundaries will be explored and addressed for obtaining and maintaining a balanced life. Patients will be encouraged to explore ways to assertively make their unbalanced needs known to significant others in their lives, using other group members and facilitator for support and feedback.  Therapeutic Goals: 1. Patient will identify two or more emotions or situations they have that consume much of in their lives. 2. Patient will identify signs/triggers that life has become out of balance:  3. Patient will identify two ways to set boundaries in order to achieve balance in their lives:  4. Patient will demonstrate ability to communicate their needs through discussion and/or role plays  Summary of Patient Progress: Pt was appropriate and respectful in group. Pt was able to identify things that throw him off balance is letting himself get upset and stated that to regain balance, he tries to regroup and re-center himself by reminding himself who he is, what he stands for, and how he wants to be seen by others. Pt identified reading, exercise, going for a walk, and writing as coping skills he utilizes.      Therapeutic Modalities:   Cognitive Behavioral Therapy Solution-Focused Therapy Assertiveness Training  Evalina Field, MSW, LCSW Clinical Social Work 06/07/2019 11:37 AM

## 2019-06-07 NOTE — Progress Notes (Signed)
[facility] MD Progress Note  06/07/2019 5:29 PM Scott Jones  MRN:  [id number] Subjective: Patient seen and chart reviewed.  Follow-up for this 74 year old gentleman with subacute onset of psychotic symptoms.  Patient met with treatment team this morning.  He started out calm but rapidly escalated becoming agitated and refusing to allow [place] to redirect him or answer any of his questions.  During the daytime today he has not been a problem on the unit with other patients but when I sat down to talk with him one-on-one he again rapidly escalated.  I attempted to call only answer some of his concerns about commitment, rationale for hospitalization, likely diagnosis but everything I said made him more angry and caused him to go off on another tangent.  At his request I read him the MRI results and tried to explain it to him but I do not think that he understood any of it because he was so angry.  Continues to repeat his delusion about the house that he had broken into before coming into the hospital.  Most of the labs I ordered have come back.  The MRI scan shows diffuse white matter changes consistent with chronic microvascular disease and also mild to moderate cortical atrophy.  Otherwise nothing revealing in his lab studies.  His parathyroid hormone was elevated but only in the range appropriate for someone on hemodialysis. Principal Problem: Psychosis ([place]) Diagnosis: Principal Problem:   Psychosis ([place]) Active Problems:   DM (diabetes mellitus) ([place])   HTN (hypertension)   Allergic rhinitis   History of hepatitis C   Dyslipidemia   Chronic kidney disease (CKD), stage IV (severe) (HCC)   H/O partial nephrectomy   Acute gouty arthritis   ESRD (end stage renal disease) ([place])   Infection of AV graft for dialysis (HCC)   Malnutrition of moderate degree   Osteoporosis   MDD (major depressive disorder), severe (HCC)  Total Time spent with patient: 30 minutes  Past Psychiatric History: Past history  apparently of some anxiety symptoms but no past history of psychotic disorder  Past Medical History:  Past Medical History:  Diagnosis Date  . Allergy   . Anemia   . Asthma   . Chronic kidney disease   . Depression   . Deviated septum   . Diabetes mellitus   . Hearing aid worn     B/L  . Hepatitis C   . Hypertension   . Infection 03/2018   RIGHT ARM DIALYSIS CATHETER  . Pneumonia   . Wears glasses     Past Surgical History:  Procedure Laterality Date  . AV FISTULA PLACEMENT    . AV FISTULA PLACEMENT Right 09/21/2018   Procedure: ARTERIOVENOUS (AV) FISTULA CREATION RIGHT ARM;  Surgeon: [name], MD;  Location: [facility];  Service: Cardiovascular;  Laterality: Right;  . COLONOSCOPY W/ BIOPSIES AND POLYPECTOMY    . EYE SURGERY    . IR FLUORO GUIDE CV LINE RIGHT  03/18/2018  . IR US GUIDE VASC ACCESS RIGHT  03/18/2018  . NEPHRECTOMY     partial - benign tumor  . PARTIAL NEPHRECTOMY  10/08/2011   Left kidney  . TONSILLECTOMY     Family History:  Family History  Problem Relation Age of Onset  . Diabetes Mother   . Hypertension Mother   . COPD Mother   . Asthma Mother   . Diabetes Father   . Diabetes Sister   . Diabetes Brother   . Kidney disease Brother  Family Psychiatric  History: See previous Social History:  Social History   Substance and Sexual Activity  Alcohol Use Yes  . Alcohol/week: 1.0 standard drinks  . Types: 1 Cans of beer per week   Comment: 1 glass wine weekly     Social History   Substance and Sexual Activity  Drug Use Not Currently  . Types: Marijuana   Comment: Marijuana cookie/candy 1x monthly    Social History   Socioeconomic History  . Marital status: Married    Spouse name: Not on file  . Number of children: Not on file  . Years of education: Not on file  . Highest education level: Not on file  Occupational History  . Not on file  Tobacco Use  . Smoking status: Former Smoker    Packs/day: 1.00    Years: 11.00     Pack years: 11.00    Types: Cigarettes, Pipe    Quit date: 08/07/1974    Years since quitting: 44.8  . Smokeless tobacco: Never Used  Substance and Sexual Activity  . Alcohol use: Yes    Alcohol/week: 1.0 standard drinks    Types: 1 Cans of beer per week    Comment: 1 glass wine weekly  . Drug use: Not Currently    Types: Marijuana    Comment: Marijuana cookie/candy 1x monthly  . Sexual activity: Yes    Birth control/protection: None  Other Topics Concern  . Not on file  Social History Narrative   ** Merged History Encounter **       Social Determinants of Health   Financial Resource Strain:   . Difficulty of Paying Living Expenses: Not on file  Food Insecurity:   . Worried About Charity fundraiser in the Last Year: Not on file  . Ran Out of Food in the Last Year: Not on file  Transportation Needs:   . Lack of Transportation (Medical): Not on file  . Lack of Transportation (Non-Medical): Not on file  Physical Activity:   . Days of Exercise per Week: Not on file  . Minutes of Exercise per Session: Not on file  Stress:   . Feeling of Stress : Not on file  Social Connections:   . Frequency of Communication with Friends and Family: Not on file  . Frequency of Social Gatherings with Friends and Family: Not on file  . Attends Religious Services: Not on file  . Active Member of Clubs or Organizations: Not on file  . Attends Archivist Meetings: Not on file  . Marital Status: Not on file   Additional Social History:                         Sleep: Fair  Appetite:  Fair  Current Medications: Current Facility-Administered Medications  Medication Dose Route Frequency Provider Last Rate Last Admin  . 0.9 %  sodium chloride infusion  100 mL Intravenous PRN [name], MD      . 0.9 %  sodium chloride infusion  100 mL Intravenous PRN [name], MD      . acetaminophen (TYLENOL) tablet 650 mg  650 mg Oral Q6H PRN [name], NP    650 mg at 06/05/19 2145  . allopurinol (ZYLOPRIM) tablet 100 mg  100 mg Oral Daily Denita Lun, [name], MD   100 mg at 06/07/19 1610  . alteplase (CATHFLO ACTIVASE) injection 2 mg  2 mg Intracatheter Once PRN [name], MD      .  aspirin chewable tablet 81 mg  81 mg Oral Daily Shasta Chinn, [name], MD   81 mg at 06/07/19 0807  . atorvastatin (LIPITOR) tablet 40 mg  40 mg Oral q1800 Vernella Niznik, [name], MD   40 mg at 06/06/19 1752  . calcitRIOL (ROCALTROL) capsule 0.25 mcg  0.25 mcg Oral Once per day on Mon Wed Fri Azai Gaffin T, MD   0.25 mcg at 06/06/19 1752  . docusate sodium (COLACE) capsule 100 mg  100 mg Oral BID Kenyonna Micek T, MD   100 mg at 06/07/19 6568  . heparin injection 1,000 Units  1,000 Units Dialysis PRN [name], MD      . heparin injection 2,000 Units  2,000 Units Dialysis Once in dialysis Roney Jaffe, MD      . heparin injection 2,000 Units  2,000 Units Dialysis Once in dialysis [name], MD      . lidocaine (PF) (XYLOCAINE) 1 % injection 5 mL  5 mL Intradermal PRN [name], MD      . lidocaine-prilocaine (EMLA) cream 1 application  1 application Topical PRN [name], MD      . NIFEdipine (ADALAT CC) 24 hr tablet 60 mg  60 mg Oral Daily Martha Ellerby, [name], MD   60 mg at 06/07/19 1275  . pentafluoroprop-tetrafluoroeth (GEBAUERS) aerosol 1 application  1 application Topical PRN [name], MD      . pioglitazone (ACTOS) tablet 30 mg  30 mg Oral Daily Ruairi Stutsman, [name], MD   30 mg at 06/07/19 0808  . risperiDONE (RISPERDAL) tablet 0.5 mg  0.5 mg Oral Daily Svea Pusch T, MD      . risperiDONE (RISPERDAL) tablet 1 mg  1 mg Oral QHS Feliberto Stockley T, MD      . sertraline (ZOLOFT) tablet 100 mg  100 mg Oral Daily [name], NP   100 mg at 06/07/19 0807  . sevelamer carbonate (RENVELA) tablet 1,600 mg  1,600 mg Oral TID WC Kolluru, Sarath, MD   1,600 mg at 06/07/19 1156    Lab Results:  Results for orders placed or performed  during the hospital encounter of 06/05/19 (from the past 48 hour(s))  Glucose, capillary     Status: None   Collection Time: 06/06/19  7:02 AM  Result Value Ref Range   Glucose-Capillary 92 70 - 99 mg/dL  Renal function panel     Status: Abnormal   Collection Time: 06/06/19 10:04 AM  Result Value Ref Range   Sodium 139 135 - 145 mmol/L   Potassium 3.9 3.5 - 5.1 mmol/L   Chloride 93 (L) 98 - 111 mmol/L   CO2 27 22 - 32 mmol/L   Glucose, Bld 234 (H) 70 - 99 mg/dL   BUN 96 (H) 8 - 23 mg/dL   Creatinine, Ser 10.03 (H) 0.61 - 1.24 mg/dL   Calcium 9.5 8.9 - 10.3 mg/dL   Phosphorus 9.8 (H) 2.5 - 4.6 mg/dL   Albumin 4.0 3.5 - 5.0 g/dL   GFR calc non Af Amer 5 (L) >60 mL/min   GFR calc Af Amer 5 (L) >60 mL/min   Anion gap 19 (H) 5 - 15    Comment: Performed at [facility], [address] [place], [place] [address]  Hepatitis B surface antigen     Status: None   Collection Time: 06/06/19 10:04 AM  Result Value Ref Range   Hepatitis B Surface Ag NON REACTIVE NON REACTIVE  Comment: Performed at [facility], [place] [address], [place], [place] [address]  Glucose, capillary     Status: Abnormal   Collection Time: 06/06/19 12:36 PM  Result Value Ref Range   Glucose-Capillary 120 (H) 70 - 99 mg/dL   Comment 1 Notify RN   TSH     Status: None   Collection Time: 06/06/19  4:39 PM  Result Value Ref Range   TSH 2.714 0.350 - 4.500 uIU/mL    Comment: Performed by a 3rd Generation assay with a functional sensitivity of <=0.01 uIU/mL. Performed at [facility], [address] [place], [place] [address]   RPR     Status: None   Collection Time: 06/06/19  4:39 PM  Result Value Ref Range   RPR Ser Ql NON REACTIVE NON REACTIVE    Comment: Performed at [facility] [address] [place], [place] [address]  Parathyroid hormone, intact (no Ca)     Status: Abnormal   Collection Time: 06/06/19  4:39 PM  Result Value Ref Range   PTH 171 (H) 15 - 65 pg/mL     Comment: (NOTE) Performed At: [facility] [place], [place] [id number] Rush Farmer MD [id number]   Glucose, capillary     Status: Abnormal   Collection Time: 06/06/19  4:40 PM  Result Value Ref Range   Glucose-Capillary 100 (H) 70 - 99 mg/dL  Vitamin B12     Status: None   Collection Time: 06/06/19  4:48 PM  Result Value Ref Range   Vitamin B-12 394 180 - 914 pg/mL    Comment: (NOTE) This assay is not validated for testing neonatal or myeloproliferative syndrome specimens for Vitamin B12 levels. Performed at [facility], [place] [address] [place], [place] [address]   Ammonia     Status: None   Collection Time: 06/06/19  6:17 PM  Result Value Ref Range   Ammonia 21 9 - 35 umol/L    Comment: Performed at [facility], [address] [place], [place] [address]  Glucose, capillary     Status: None   Collection Time: 06/07/19  6:49 AM  Result Value Ref Range   Glucose-Capillary 93 70 - 99 mg/dL  Glucose, capillary     Status: Abnormal   Collection Time: 06/07/19 11:23 AM  Result Value Ref Range   Glucose-Capillary 137 (H) 70 - 99 mg/dL   Comment 1 Notify RN   Glucose, capillary     Status: None   Collection Time: 06/07/19  4:24 PM  Result Value Ref Range   Glucose-Capillary 85 70 - 99 mg/dL   Comment 1 Notify RN     Blood Alcohol level:  Lab Results  Component Value Date   ETH <10 93/71/6967    Metabolic Disorder Labs: Lab Results  Component Value Date   HGBA1C 5.2 06/01/2019   MPG 102.54 06/01/2019   MPG 105 07/26/2016   No results found for: PROLACTIN Lab Results  Component Value Date   CHOL 131 07/26/2016   TRIG 137.0 07/26/2016   HDL 38.90 (L) 07/26/2016   CHOLHDL 3 07/26/2016   VLDL 27.4 07/26/2016   LDLCALC 65 07/26/2016   LDLCALC 92 08/07/2015    Physical Findings: AIMS:  , ,  ,  ,    CIWA:    COWS:     Musculoskeletal: Strength & Muscle Tone: within normal limits Gait & Station: normal Patient  leans: N/A  Psychiatric Specialty Exam: Physical Exam  Nursing note and vitals reviewed. Constitutional: He appears well-developed and well-nourished.  HENT:  Head: Normocephalic and atraumatic.  Eyes: Pupils are equal, round, and reactive to light. Conjunctivae are normal.  Cardiovascular: Regular rhythm and normal heart sounds.  Respiratory: Effort normal. No respiratory distress.  GI: Soft.  Musculoskeletal:        General: Normal range of motion.     Cervical back: Normal range of motion.  Neurological: He is alert.  Skin: Skin is warm and dry.  Psychiatric: His affect is labile. His speech is tangential. He is agitated and aggressive. Thought content is paranoid and delusional. Cognition and memory are impaired. He expresses impulsivity and inappropriate judgment. He is inattentive.    Review of Systems  Constitutional: Negative.   HENT: Negative.   Eyes: Negative.   Respiratory: Negative.   Cardiovascular: Negative.   Gastrointestinal: Negative.   Musculoskeletal: Negative.   Skin: Negative.   Neurological: Negative.   Psychiatric/Behavioral: The patient is nervous/anxious.     Blood pressure 136/70, pulse 75, temperature 98.4 F (36.9 C), temperature source Oral, resp. rate 17, height _0  (1.651 m), weight 73.3 kg, SpO2 100 %.Body mass index is 26.89 kg/m.  General Appearance: Disheveled  Eye Contact:  Minimal  Speech:  Garbled and Pressured  Volume:  Increased  Mood:  Angry and Irritable  Affect:  Congruent, Inappropriate and Labile  Thought Process:  Disorganized  Orientation:  Full (Time, Place, and Person)  Thought Content:  Illogical, Delusions, Paranoid Ideation and Tangential  Suicidal Thoughts:  No  Homicidal Thoughts:  No  Memory:  Immediate;   Fair Recent;   Poor Remote;   Poor  Judgement:  Impaired  Insight:  Lacking  Psychomotor Activity:  Restlessness  Concentration:  Concentration: Poor  Recall:  [place] of Knowledge:  Fair  Language:   Fair  Akathisia:  No  Handed:  Right  AIMS (if indicated):     Assets:  Desire for Improvement Housing Resilience Social Support  ADL's:  Impaired  Cognition:  Impaired,  Mild  Sleep:  Number of Hours: 5.15     Treatment Plan Summary: Daily contact with patient to assess and evaluate symptoms and progress in treatment, Medication management and Plan I attempted to answer his questions about why he was still in the hospital.  I tried to explain to him that he had a new onset psychotic disorder of unknown cause and that his behavior as a result could put him in dangerous situations and that it was appropriate to try and evaluated and treated before discharging him.  This made no impact on him at all but is true.  I am going to increase his Risperdal at night to 1 mg in the morning start at 1/2 mg.  Wife is visiting tonight and I look forward to hearing what she has to say.  I had hoped previously that this would be a very short stay but given his agitation I think it is going to be more difficult to justify discharge sooner rather than later.  Alethia Berthold, MD 06/07/2019, 5:29 PM

## 2019-06-07 NOTE — Plan of Care (Addendum)
Pt denies depression, anxiety, SI, HI and AVH. Pt was educated on care plan and verbalizes understanding. Collier Bullock RN Problem: Education: Goal: Knowledge of [facility] Education information/materials will improve Outcome: Progressing Goal: Emotional status will improve Outcome: Progressing Goal: Mental status will improve Outcome: Progressing Goal: Verbalization of understanding the information provided will improve Outcome: Progressing   Problem: Safety: Goal: Periods of time without injury will increase Outcome: Progressing   Problem: Activity: Goal: Will verbalize the importance of balancing activity with adequate rest periods Outcome: Progressing   Problem: Safety: Goal: Ability to redirect hostility and anger into socially appropriate behaviors will improve Outcome: Progressing Goal: Ability to remain free from injury will improve Outcome: Progressing

## 2019-06-07 NOTE — Progress Notes (Signed)
Patient was in his room upon arrival to the unit. Patient was irritable during assessment stating, "I am done with Dr. Weber Cooks, he is out to get me. He is saying I am hearing voices and that I am psychotic when it's clear that I'm not." Patient given encouragement to be active in his treatment plan. Patient's wife was here to visit and requested to speak with this Probation officer. She stated that she wants to talk to Dr. Weber Cooks when he gets in, in the morning. Patient refused his medications this evening stating, "I don't trust what Dr. Weber Cooks has me on." Patient given education, patient still refused. Patient observed interacting appropriately with staff and peers on the unit this evening. Patient being monitored Q 15 minutes for safety per unit protocol. Patient remains safe on the unit.

## 2019-06-07 NOTE — B[facility] Counselor (Signed)
CSW met with the patient one on one following treatment team.  Pt was upset as he felt that his rights have been violated and he has not been provided a patient rights handbook. CSW reminded patient that this CSW gave patient a handbook on 06/06/2019 when asked for it.  CSW acknowledged that patient should have received at admission onto the unit and allegedly had not and apologized for it.  CSW notes that handbook is provided by techs and/or nursing staff.  CSW reviewed the packet with the patient again, reminding him that the CSW reviewed the packet with the patient on 06/06/2019.  Pt was visibly upset as evidenced by red face, raised voice and verbal statements.  CSW was able to redirect and deescalate the patient.    CSW noted that patient was upset over not having things explained to him and issues that he had that he had not voiced to staff.  CSW encouraged pt to speak out on his concerns and voice them as that is the only way that staff can address them.  CSW also apologized for not being explained phone and visitation hours upon admission,  CSW acknowledged that this should have been covered at admission and pointed out that the information is printed above the phones.  Patient requested the Patient Advocate number and CSW pointed out that she does not have it but would look for it.  CSW did review in the patient handbook where the contact information for Information systems manager contact information was.  At patients request CSW asked Director/Assistant Director to speak with the patient.    CSW did locate Patient Experience number and provided to the Assistant Director who was planning to meet with the patient to address his needs.  Patient expressed that he was embarrassed to ask for clothing, including underwear and CSW staff supported patient by finding articles and getting his sizes.  Assunta Curtis, MSW, LCSW 06/07/2019 12:28 PM

## 2019-06-07 NOTE — Plan of Care (Signed)
Patient presents with mild confusion and with delusional thoughts   Problem: Education: Goal: Emotional status will improve Outcome: Not Progressing Goal: Mental status will improve Outcome: Not Progressing

## 2019-06-07 NOTE — Tx Team (Cosign Needed)
Interdisciplinary Treatment and Diagnostic Plan Update  06/07/2019 Time of Session: 9am Scott Jones MRN: [id number]  Principal Diagnosis: Psychosis [facility])  Secondary Diagnoses: Principal Problem:   Psychosis ([place]) Active Problems:   DM (diabetes mellitus) ([place])   HTN (hypertension)   Allergic rhinitis   History of hepatitis C   Dyslipidemia   Chronic kidney disease (CKD), stage IV (severe) ([place])   H/O partial nephrectomy   Acute gouty arthritis   ESRD (end stage renal disease) ([place])   Infection of AV graft for dialysis ([place])   Malnutrition of moderate degree   Osteoporosis   MDD (major depressive disorder), severe ([facility])   Current Medications:  Current Facility-Administered Medications  Medication Dose Route Frequency Provider Last Rate Last Admin  . 0.9 %  sodium chloride infusion  100 mL Intravenous PRN [name], MD      . 0.9 %  sodium chloride infusion  100 mL Intravenous PRN [name], MD      . acetaminophen (TYLENOL) tablet 650 mg  650 mg Oral Q6H PRN [name], NP   650 mg at 06/05/19 2145  . allopurinol (ZYLOPRIM) tablet 100 mg  100 mg Oral Daily [name], [name], MD   100 mg at 06/07/19 7062  . alteplase (CATHFLO ACTIVASE) injection 2 mg  2 mg Intracatheter Once PRN [name], MD      . aspirin chewable tablet 81 mg  81 mg Oral Daily [name], [name], MD   81 mg at 06/07/19 0807  . atorvastatin (LIPITOR) tablet 40 mg  40 mg Oral q1800 [name], [name], MD   40 mg at 06/06/19 1752  . calcitRIOL (ROCALTROL) capsule 0.25 mcg  0.25 mcg Oral Once per day on Mon Wed Fri [name], [name], MD   0.25 mcg at 06/06/19 1752  . docusate sodium (COLACE) capsule 100 mg  100 mg Oral BID [name], [name], MD   100 mg at 06/07/19 3762  . heparin injection 1,000 Units  1,000 Units Dialysis PRN [name], MD      . heparin injection 2,000 Units  2,000 Units Dialysis Once in dialysis Roney Jaffe, MD      . heparin injection 2,000 Units   2,000 Units Dialysis Once in dialysis [name], MD      . lidocaine (PF) (XYLOCAINE) 1 % injection 5 mL  5 mL Intradermal PRN [name], MD      . lidocaine-prilocaine (EMLA) cream 1 application  1 application Topical PRN [name], MD      . NIFEdipine (ADALAT CC) 24 hr tablet 60 mg  60 mg Oral Daily [name], [name], MD   60 mg at 06/07/19 8315  . pentafluoroprop-tetrafluoroeth (GEBAUERS) aerosol 1 application  1 application Topical PRN [name], MD      . pioglitazone (ACTOS) tablet 30 mg  30 mg Oral Daily [name], [name], MD   30 mg at 06/07/19 0808  . risperiDONE (RISPERDAL) tablet 0.5 mg  0.5 mg Oral QHS [name], [name], MD   0.5 mg at 06/06/19 2128  . sertraline (ZOLOFT) tablet 100 mg  100 mg Oral Daily [name], NP   100 mg at 06/07/19 0807  . sevelamer carbonate (RENVELA) tablet 1,600 mg  1,600 mg Oral TID WC Kolluru, Sarath, MD   1,600 mg at 06/07/19 1156   PTA Medications: Medications Prior to Admission  Medication Sig Dispense Refill Last Dose  . allopurinol (ZYLOPRIM) 100  MG tablet Take 1 tablet (100 mg total) by mouth daily. 90 tablet 3   . aspirin 81 MG tablet Take 81 mg by mouth daily.     Marland Kitchen atorvastatin (LIPITOR) 40 MG tablet Take 1 tablet (40 mg total) by mouth daily. 90 tablet 3   . B Complex-C-Folic Acid (DIALYVITE TABLET) TABS Take 1 tablet by mouth at bedtime.     . blood glucose meter kit and supplies Pt requesting ONE TOUCH. Use up to 3 times daily as directed. (FOR ICD-10 E10.9, E11.9). 1 each 0   . Blood Glucose Monitoring Suppl (ONE TOUCH ULTRA 2) w/Device KIT Use as directed once a day.  E11.9 1 each 0   . calcitRIOL (ROCALTROL) 0.25 MCG capsule Take 0.25 mcg by mouth 3 (three) times a week.     . docusate sodium (COLACE) 100 MG capsule Take 100 mg by mouth daily.      Marland Kitchen glucose blood test strip Use as directed once a day.  Dx code E11.9 100 each 12   . meloxicam (MOBIC) 7.5 MG tablet Take 1 tablet (7.5 mg total) by mouth  daily. (Patient not taking: Reported on 06/02/2019) 30 tablet 3   . methocarbamol (ROBAXIN) 500 MG tablet Take 1 tablet (500 mg total) by mouth every 8 (eight) hours as needed for muscle spasms. (Patient not taking: Reported on 06/02/2019) 60 tablet 3   . NIFEdipine (ADALAT CC) 60 MG 24 hr tablet Take 1 tablet (60 mg total) by mouth at bedtime. 90 tablet 3   . ONETOUCH DELICA LANCETS 30Z MISC 1 each by Does not apply route daily. Dx code E11.9 100 each 1   . pioglitazone (ACTOS) 30 MG tablet Take 1 tablet by mouth once daily (Patient taking differently: Take 30 mg by mouth daily. ) 90 tablet 1   . polyvinyl alcohol (LIQUIFILM TEARS) 1.4 % ophthalmic solution Place 1 drop into both eyes as needed for dry eyes.     Marland Kitchen sertraline (ZOLOFT) 100 MG tablet Take 2 tablets (200 mg total) by mouth daily. 180 tablet 1   . sevelamer carbonate (RENVELA) 800 MG tablet Take 1,600-2,400 mg by mouth See admin instructions. '1600mg'$  with snacks 2400 with meals       Patient Stressors: Health problems Medication change or noncompliance  Patient Strengths: Technical sales engineer for treatment/growth Religious Affiliation Supportive family/friends  Treatment Modalities: Medication Management, Group therapy, Case management,  1 to 1 session with clinician, Psychoeducation, Recreational therapy.   Physician Treatment Plan for Primary Diagnosis: Psychosis ([place]) Long Term Goal(s): Improvement in symptoms so as ready for discharge Improvement in symptoms so as ready for discharge   Short Term Goals: Ability to demonstrate self-control will improve Ability to maintain clinical measurements within normal limits will improve Compliance with prescribed medications will improve  Medication Management: Evaluate patient's response, side effects, and tolerance of medication regimen.  Therapeutic Interventions: 1 to 1 sessions, Unit Group sessions and Medication administration.  Evaluation of Outcomes:  Progressing  Physician Treatment Plan for Secondary Diagnosis: Principal Problem:   Psychosis ([place]) Active Problems:   DM (diabetes mellitus) ([place])   HTN (hypertension)   Allergic rhinitis   History of hepatitis C   Dyslipidemia   Chronic kidney disease (CKD), stage IV (severe) (H[facility])   H/O partial nephrectomy   Acute gouty arthritis   ESRD (end stage renal disease) ([place])   Infection of AV graft for dialysis ([place])   Malnutrition of moderate degree   Osteoporosis  MDD (major depressive disorder), severe ([place])  Long Term Goal(s): Improvement in symptoms so as ready for discharge Improvement in symptoms so as ready for discharge   Short Term Goals: Ability to demonstrate self-control will improve Ability to maintain clinical measurements within normal limits will improve Compliance with prescribed medications will improve     Medication Management: Evaluate patient's response, side effects, and tolerance of medication regimen.  Therapeutic Interventions: 1 to 1 sessions, Unit Group sessions and Medication administration.  Evaluation of Outcomes: Progressing   RN Treatment Plan for Primary Diagnosis: Psychosis ([place]) Long Term Goal(s): Knowledge of disease and therapeutic regimen to maintain health will improve  Short Term Goals: Ability to verbalize frustration and anger appropriately will improve, Ability to identify and develop effective coping behaviors will improve and Compliance with prescribed medications will improve  Medication Management: RN will administer medications as ordered by provider, will assess and evaluate patient's response and provide education to patient for prescribed medication. RN will report any adverse and/or side effects to prescribing provider.  Therapeutic Interventions: 1 on 1 counseling sessions, Psychoeducation, Medication administration, Evaluate responses to treatment, Monitor vital signs and CBGs as ordered, Perform/monitor CIWA, COWS, AIMS and  Fall Risk screenings as ordered, Perform wound care treatments as ordered.  Evaluation of Outcomes: Progressing   LCSW Treatment Plan for Primary Diagnosis: Psychosis ([place]) Long Term Goal(s): Safe transition to appropriate next level of care at discharge, Engage patient in therapeutic group addressing interpersonal concerns.  Short Term Goals: Engage patient in aftercare planning with referrals and resources  Therapeutic Interventions: Assess for all discharge needs, 1 to 1 time with Social worker, Explore available resources and support systems, Assess for adequacy in community support network, Educate family and significant other(s) on suicide prevention, Complete Psychosocial Assessment, Interpersonal group therapy.  Evaluation of Outcomes: Progressing   Progress in Treatment: Attending groups: No. Participating in groups: No. Taking medication as prescribed: Yes. Toleration medication: Yes. Family/Significant other contact made: Yes, individual(s) contacted:  pt wife Patient understands diagnosis: Yes. Discussing patient identified problems/goals with staff: Yes. Medical problems stabilized or resolved: Yes. Denies suicidal/homicidal ideation: Yes. Issues/concerns per patient self-inventory: No. Other: NA  New problem(s) identified: No, Describe:  None reported  New Short Term/Long Term Goal(s):Attend outpatient treatment, take medication as prescribed, develop and implement healthy coping methods  Patient Goals:  "Shorten my time and go home"  Discharge Plan or Barriers: Pt is scheduled for discharge on 1/15 and has declined outpatient treatment. CSW's will provide pt with list of mental health provider should he change his mind.  Reason for Continuation of Hospitalization: Medication stabilization  Estimated Length of Stay: 1-3 days  Attendees: Patient: Scott Jones 06/07/2019 1:46 PM  Physician: Alethia Berthold 06/07/2019 1:46 PM  Nursing:  06/07/2019 1:46 PM  RN Care  Manager: 06/07/2019 1:46 PM  Social Worker: Sanjuana Kava [facility] [place] 06/07/2019 1:46 PM  Recreational Therapist: Roanna Epley 06/07/2019 1:46 PM  Other:  06/07/2019 1:46 PM  Other:  06/07/2019 1:46 PM  Other: 06/07/2019 1:46 PM    Scribe for Treatment Team: Yvette Rack, LCSW 06/07/2019 1:46 PM

## 2019-06-08 DIAGNOSIS — F23 Brief psychotic disorder: Secondary | ICD-10-CM | POA: Diagnosis not present

## 2019-06-08 LAB — CBC
HCT: 36.9 % — ABNORMAL LOW (ref 39.0–52.0)
Hemoglobin: 12 g/dL — ABNORMAL LOW (ref 13.0–17.0)
MCH: 32.7 pg (ref 26.0–34.0)
MCHC: 32.5 g/dL (ref 30.0–36.0)
MCV: 100.5 fL — ABNORMAL HIGH (ref 80.0–100.0)
Platelets: 150 10*3/uL (ref 150–400)
RBC: 3.67 MIL/uL — ABNORMAL LOW (ref 4.22–5.81)
RDW: 13.2 % (ref 11.5–15.5)
WBC: 5 10*3/uL (ref 4.0–10.5)
nRBC: 0 % (ref 0.0–0.2)

## 2019-06-08 LAB — RENAL FUNCTION PANEL
Albumin: 4 g/dL (ref 3.5–5.0)
Anion gap: 17 — ABNORMAL HIGH (ref 5–15)
BUN: 66 mg/dL — ABNORMAL HIGH (ref 8–23)
CO2: 25 mmol/L (ref 22–32)
Calcium: 9 mg/dL (ref 8.9–10.3)
Chloride: 95 mmol/L — ABNORMAL LOW (ref 98–111)
Creatinine, Ser: 7.06 mg/dL — ABNORMAL HIGH (ref 0.61–1.24)
GFR calc Af Amer: 8 mL/min — ABNORMAL LOW (ref >60)
GFR calc non Af Amer: 7 mL/min — ABNORMAL LOW (ref >60)
Glucose, Bld: 128 mg/dL — ABNORMAL HIGH (ref 70–99)
Phosphorus: 6.5 mg/dL — ABNORMAL HIGH (ref 2.5–4.6)
Potassium: 4.2 mmol/L (ref 3.5–5.1)
Sodium: 137 mmol/L (ref 135–145)

## 2019-06-08 LAB — GLUCOSE, CAPILLARY: Glucose-Capillary: 74 mg/dL (ref 70–99)

## 2019-06-08 NOTE — Progress Notes (Signed)
Recreation Therapy Notes   Date: 06/08/2019  Time: 9:30 am   Location: Craft room   Behavioral response: N/A   Intervention Topic: Strengths   Discussion/Intervention: Patient did not attend group.   Clinical Observations/Feedback:  Patient did not attend group.   Tynia Wiers LRT/CTRS        Colbin Jovel 06/08/2019 10:47 AM

## 2019-06-08 NOTE — Plan of Care (Signed)
Patient stated that because of " misunderstanding " he is here.Patient refused to take Risperdal states " I am not psychotic ".Patient had a logical conversation with staff.Patient have insight for what he did before coming here and he has a plan not to do that again.Appropriate in the unit.Denies SI,HI and AVH.Appetite and energy level good.Patient had dialysis today.Support and encouragement given.

## 2019-06-08 NOTE — Progress Notes (Signed)
Home Hemodialysis patient known at [facility]. In [place]. Please contact me with any dialysis placement concerns.   Elvera Bicker Dialysis Coordinator [phone]

## 2019-06-08 NOTE — Progress Notes (Signed)
[facility] MD Progress Note  06/08/2019 2:19 PM Scott Jones  MRN:  [id number]   Subjective: Follow-up for this patient with psychosis.  Patient reports today that he is doing well.  Patient has denied any suicidal or homicidal ideations and continues to deny having any hallucinations.  Patient does report that he feels that he is having some neurological issues and reports that he understands that these things have been in his hand only and that they did not really exist.  He states that some of it still fits seems confusing and that it seems like some of reality is mixing with the thoughts that he was having that were not real.  He reports that he would really like to go home and he plans on staying with his wife or with his daughter and not going anywhere without them.  He reports that since everyone has told him the same story that he must have went into a random person's house with the thought that he had permission to go in there and that it was only in his head.  The patient continues report that he had no issues at dialysis today and that he has been sleeping and eating quite well.  He states that he plans for his wife to visit again tonight.  Principal Problem: Psychosis ([place]) Diagnosis: Principal Problem:   Psychosis ([place]) Active Problems:   DM (diabetes mellitus) ([place])   HTN (hypertension)   Allergic rhinitis   History of hepatitis C   Dyslipidemia   Chronic kidney disease (CKD), stage IV (severe) (HCC)   H/O partial nephrectomy   Acute gouty arthritis   ESRD (end stage renal disease) ([place])   Infection of AV graft for dialysis ([place])   Malnutrition of moderate degree   Osteoporosis   MDD (major depressive disorder), severe ([place])  Total Time spent with patient: 20 minutes  Past Psychiatric History: As noted previously patient had a diagnosis of PTSD and has been on modest dose SSRI.  No history of violence no history of suicidality no previous history of any psychotic  symptoms  Past Medical History:  Past Medical History:  Diagnosis Date  . Allergy   . Anemia   . Asthma   . Chronic kidney disease   . Depression   . Deviated septum   . Diabetes mellitus   . Hearing aid worn     B/L  . Hepatitis C   . Hypertension   . Infection 03/2018   RIGHT ARM DIALYSIS CATHETER  . Pneumonia   . Wears glasses     Past Surgical History:  Procedure Laterality Date  . AV FISTULA PLACEMENT    . AV FISTULA PLACEMENT Right 09/21/2018   Procedure: ARTERIOVENOUS (AV) FISTULA CREATION RIGHT ARM;  Surgeon: [name], MD;  Location: [facility];  Service: Cardiovascular;  Laterality: Right;  . COLONOSCOPY W/ BIOPSIES AND POLYPECTOMY    . EYE SURGERY    . IR FLUORO GUIDE CV LINE RIGHT  03/18/2018  . IR US GUIDE VASC ACCESS RIGHT  03/18/2018  . NEPHRECTOMY     partial - benign tumor  . PARTIAL NEPHRECTOMY  10/08/2011   Left kidney  . TONSILLECTOMY     Family History:  Family History  Problem Relation Age of Onset  . Diabetes Mother   . Hypertension Mother   . COPD Mother   . Asthma Mother   . Diabetes Father   . Diabetes Sister   . Diabetes Brother   .  Kidney disease Brother    Family Psychiatric  History: None reported Social History:  Social History   Substance and Sexual Activity  Alcohol Use Yes  . Alcohol/week: 1.0 standard drinks  . Types: 1 Cans of beer per week   Comment: 1 glass wine weekly     Social History   Substance and Sexual Activity  Drug Use Not Currently  . Types: Marijuana   Comment: Marijuana cookie/candy 1x monthly    Social History   Socioeconomic History  . Marital status: Married    Spouse name: Not on file  . Number of children: Not on file  . Years of education: Not on file  . Highest education level: Not on file  Occupational History  . Not on file  Tobacco Use  . Smoking status: Former Smoker    Packs/day: 1.00    Years: 11.00    Pack years: 11.00    Types: Cigarettes, Pipe    Quit date:  08/07/1974    Years since quitting: 44.8  . Smokeless tobacco: Never Used  Substance and Sexual Activity  . Alcohol use: Yes    Alcohol/week: 1.0 standard drinks    Types: 1 Cans of beer per week    Comment: 1 glass wine weekly  . Drug use: Not Currently    Types: Marijuana    Comment: Marijuana cookie/candy 1x monthly  . Sexual activity: Yes    Birth control/protection: None  Other Topics Concern  . Not on file  Social History Narrative   ** Merged History Encounter **       Social Determinants of Health   Financial Resource Strain:   . Difficulty of Paying Living Expenses: Not on file  Food Insecurity:   . Worried About Charity fundraiser in the Last Year: Not on file  . Ran Out of Food in the Last Year: Not on file  Transportation Needs:   . Lack of Transportation (Medical): Not on file  . Lack of Transportation (Non-Medical): Not on file  Physical Activity:   . Days of Exercise per Week: Not on file  . Minutes of Exercise per Session: Not on file  Stress:   . Feeling of Stress : Not on file  Social Connections:   . Frequency of Communication with Friends and Family: Not on file  . Frequency of Social Gatherings with Friends and Family: Not on file  . Attends Religious Services: Not on file  . Active Member of Clubs or Organizations: Not on file  . Attends Archivist Meetings: Not on file  . Marital Status: Not on file   Additional Social History:                         Sleep: Good  Appetite:  Good  Current Medications: Current Facility-Administered Medications  Medication Dose Route Frequency Provider Last Rate Last Admin  . 0.9 %  sodium chloride infusion  100 mL Intravenous PRN [name], MD      . 0.9 %  sodium chloride infusion  100 mL Intravenous PRN [name], MD      . acetaminophen (TYLENOL) tablet 650 mg  650 mg Oral Q6H PRN [name], NP   650 mg at 06/05/19 2145  . allopurinol (ZYLOPRIM) tablet 100  mg  100 mg Oral Daily [name], [name], MD   100 mg at 06/08/19 1340  . alteplase (CATHFLO ACTIVASE) injection 2 mg  2 mg Intracatheter  Once PRN [name], MD      . aspirin chewable tablet 81 mg  81 mg Oral Daily [name], [name], MD   81 mg at 06/08/19 1338  . atorvastatin (LIPITOR) tablet 40 mg  40 mg Oral q1800 [name], [name], MD   40 mg at 06/07/19 1740  . calcitRIOL (ROCALTROL) capsule 0.25 mcg  0.25 mcg Oral Once per day on Mon Wed Fri [name], [name], MD   0.25 mcg at 06/08/19 1341  . docusate sodium (COLACE) capsule 100 mg  100 mg Oral BID [name], [name], MD   100 mg at 06/08/19 1339  . heparin injection 1,000 Units  1,000 Units Dialysis PRN [name], MD      . heparin injection 2,000 Units  2,000 Units Dialysis Once in dialysis Roney Jaffe, MD      . heparin injection 2,000 Units  2,000 Units Dialysis Once in dialysis [name], MD      . lidocaine (PF) (XYLOCAINE) 1 % injection 5 mL  5 mL Intradermal PRN [name], MD      . lidocaine-prilocaine (EMLA) cream 1 application  1 application Topical PRN [name], MD      . NIFEdipine (ADALAT CC) 24 hr tablet 60 mg  60 mg Oral Daily [name], [name], MD   60 mg at 06/08/19 1342  . pentafluoroprop-tetrafluoroeth (GEBAUERS) aerosol 1 application  1 application Topical PRN [name], MD      . pioglitazone (ACTOS) tablet 30 mg  30 mg Oral Daily [name], [name], MD   30 mg at 06/08/19 1341  . risperiDONE (RISPERDAL) tablet 0.5 mg  0.5 mg Oral Daily [name], [name], MD   0.5 mg at 06/07/19 1743  . risperiDONE (RISPERDAL) tablet 1 mg  1 mg Oral QHS [name], [name], MD      . sertraline (ZOLOFT) tablet 100 mg  100 mg Oral Daily [name], NP   100 mg at 06/08/19 1338  . sevelamer carbonate (RENVELA) tablet 1,600 mg  1,600 mg Oral TID WC Kolluru, Sarath, MD   1,600 mg at 06/08/19 1342    Lab Results:  Results for orders placed or performed during the hospital encounter of 06/05/19  (from the past 48 hour(s))  TSH     Status: None   Collection Time: 06/06/19  4:39 PM  Result Value Ref Range   TSH 2.714 0.350 - 4.500 uIU/mL    Comment: Performed by a 3rd Generation assay with a functional sensitivity of <=0.01 uIU/mL. Performed at [facility], [address] [place], [place] [address]   RPR     Status: None   Collection Time: 06/06/19  4:39 PM  Result Value Ref Range   RPR Ser Ql NON REACTIVE NON REACTIVE    Comment: Performed at [facility] [address], [place], [place] [address]  Parathyroid hormone, intact (no Ca)     Status: Abnormal   Collection Time: 06/06/19  4:39 PM  Result Value Ref Range   PTH 171 (H) 15 - 65 pg/mL    Comment: (NOTE) Performed At: [facility] [place], [place] [id number] Rush Farmer MD [id number]   Glucose, capillary     Status: Abnormal   Collection Time: 06/06/19  4:40 PM  Result Value Ref Range   Glucose-Capillary 100 (H) 70 - 99 mg/dL  Vitamin B12     Status: None   Collection Time: 06/06/19  4:48 PM  Result Value Ref Range   Vitamin B-12 394 180 - 914 pg/mL    Comment: (NOTE) This assay is not validated for testing neonatal or myeloproliferative syndrome specimens for Vitamin B12 levels. Performed at [facility], [place] [address] [place], [place] [address]   Ammonia     Status: None   Collection Time: 06/06/19  6:17 PM  Result Value Ref Range   Ammonia 21 9 - 35 umol/L    Comment: Performed at [facility], [address] [place], [place] [address]  Glucose, capillary     Status: None   Collection Time: 06/07/19  6:49 AM  Result Value Ref Range   Glucose-Capillary 93 70 - 99 mg/dL  Glucose, capillary     Status: Abnormal   Collection Time: 06/07/19 11:23 AM  Result Value Ref Range   Glucose-Capillary 137 (H) 70 - 99 mg/dL   Comment 1 Notify RN   Glucose, capillary     Status: None   Collection Time: 06/07/19  4:24 PM  Result Value Ref Range    Glucose-Capillary 85 70 - 99 mg/dL   Comment 1 Notify RN   Glucose, capillary     Status: None   Collection Time: 06/08/19  6:59 AM  Result Value Ref Range   Glucose-Capillary 74 70 - 99 mg/dL  Renal function panel     Status: Abnormal   Collection Time: 06/08/19  9:49 AM  Result Value Ref Range   Sodium 137 135 - 145 mmol/L   Potassium 4.2 3.5 - 5.1 mmol/L   Chloride 95 (L) 98 - 111 mmol/L   CO2 25 22 - 32 mmol/L   Glucose, Bld 128 (H) 70 - 99 mg/dL   BUN 66 (H) 8 - 23 mg/dL   Creatinine, Ser 7.06 (H) 0.61 - 1.24 mg/dL   Calcium 9.0 8.9 - 10.3 mg/dL   Phosphorus 6.5 (H) 2.5 - 4.6 mg/dL   Albumin 4.0 3.5 - 5.0 g/dL   GFR calc non Af Amer 7 (L) >60 mL/min   GFR calc Af Amer 8 (L) >60 mL/min   Anion gap 17 (H) 5 - 15    Comment: Performed at [facility], [address] [place], [place] [address]  CBC     Status: Abnormal   Collection Time: 06/08/19  9:49 AM  Result Value Ref Range   WBC 5.0 4.0 - 10.5 K/uL   RBC 3.67 (L) 4.22 - 5.81 MIL/uL   Hemoglobin 12.0 (L) 13.0 - 17.0 g/dL   HCT 36.9 (L) 39.0 - 52.0 %   MCV 100.5 (H) 80.0 - 100.0 fL   MCH 32.7 26.0 - 34.0 pg   MCHC 32.5 30.0 - 36.0 g/dL   RDW 13.2 11.5 - 15.5 %   Platelets 150 150 - 400 K/uL   nRBC 0.0 0.0 - 0.2 %    Comment: Performed at [facility], [address] [place], [place] [address]    Blood Alcohol level:  Lab Results  Component Value Date   [facility] <10 29/79/8921    Metabolic Disorder Labs: Lab Results  Component Value Date   HGBA1C 5.2 06/01/2019   MPG 102.54 06/01/2019   MPG 105 07/26/2016   No results found for: PROLACTIN Lab Results  Component Value Date   CHOL 131 07/26/2016   TRIG 137.0 07/26/2016   HDL 38.90 (L) 07/26/2016   CHOLHDL 3 07/26/2016   VLDL 27.4 07/26/2016   LDLCALC 65 07/26/2016   LDLCALC  92 08/07/2015    Physical Findings: AIMS:  , ,  ,  ,    CIWA:    COWS:     Musculoskeletal: Strength & Muscle Tone: within normal limits Gait & Station:  normal Patient leans: N/A  Psychiatric Specialty Exam: Physical Exam  Review of Systems  Blood pressure 137/62, pulse 78, temperature (!) 97.4 F (36.3 C), temperature source Oral, resp. rate 16, height $RemoveBe'5\' 5"'[id number]$  (1.651 m), weight 74.3 kg, SpO2 100 %.Body mass index is 27.26 kg/m.  General Appearance: Casual  Eye Contact:  Good  Speech:  Clear and Coherent and Normal Rate  Volume:  Normal  Mood:  Euthymic  Affect:  Congruent  Thought Process:  Coherent and Descriptions of Associations: Intact  Orientation:  Full (Time, Place, and Person)  Thought Content:  WDL and Logical  Suicidal Thoughts:  No  Homicidal Thoughts:  No  Memory:  Immediate;   Fair Recent;   Fair Remote;   Fair  Judgement:  Fair  Insight:  Fair  Psychomotor Activity:  Normal  Concentration:  Concentration: Fair  Recall:  [employer] of Knowledge:  Fair  Language:  Fair  Akathisia:  No  Handed:  Right  AIMS (if indicated):     Assets:  Communication Skills Desire for Improvement Financial Resources/Insurance Housing Social Support  ADL's:  Intact  Cognition:  Impaired,  Mild  Sleep:  Number of Hours: 5.5   Assessment: Patient presents in the medication room with the RN and the patient is pleasant, calm, cooperative.  Patient's conversation is more logical and understanding than in the past few days.  Patient appears to be clearing.  However, it was reported the patient has not been taking his Risperdal but has continued to show improvement.  Consulted with Dr. Weber Cooks about patient's clarity today and his logical conversation and understanding of the events that took place prior to coming to the hospital.  Would like to monitor patient for at least 1 more day to ensure that he is continually clearing and to potentially have his wife come and visit him again to see if his return to baseline at this time.  Treatment Plan Summary: Daily contact with patient to assess and evaluate symptoms and progress in treatment  and Medication management We will continue offering the patient Risperdal 0.5 mg p.o. daily and 1 mg p.o. nightly Continue Zoloft 100 mg p.o. daily for depression Continue all other medications as prescribed for medical reasons. Encourage group therapy participation Continue every 15 minute safety checks  Lewis Shock, FNP 06/08/2019, 2:19 PM

## 2019-06-08 NOTE — Progress Notes (Addendum)
Recreation Therapy Notes  INPATIENT RECREATION THERAPY ASSESSMENT  Patient Details Name: Scott Jones MRN: [id number] DOB: Oct 21, 1945 Today's Date: 06/08/2019       Information Obtained From: Patient  Able to Participate in Assessment/Interview: Yes  Patient Presentation: Responsive  Reason for Admission (Per Patient): Active Symptoms  Patient Stressors:    Coping Skills:   Music, Talk  Leisure Interests (2+):  Music - Write music, Music - Listen, Games - Board games, Sports - Golf, Individual - Reading  Frequency of Recreation/Participation: Weekly  Awareness of Community Resources:     [employer]:     Current Use:    If no, Barriers?:    Expressed Interest in [place] of Residence:  [place]  Patient Main Form of Transportation: Musician  Patient Strengths:  Probation officer  Patient Identified Areas of Improvement:  Getting second book published  Patient Goal for Hospitalization:  To go home  Current SI (including self-harm):  No  Current HI:  No  Current AVH: No  Staff Intervention Plan: Group Attendance, Collaborate with Interdisciplinary Treatment Team  Consent to Intern Participation: N/A  Scott Jones 06/08/2019, 3:34 PM

## 2019-06-08 NOTE — Progress Notes (Signed)
[facility]  ROUNDING NOTE   Subjective:   Seen and examined on hemodialysis treatment. Tolerating treatment well. Seated in chair.   Objective:  Vital signs in last 24 hours:  Temp:  [97.4 F (36.3 C)] 97.4 F (36.3 C) (01/15 1230) Pulse Rate:  [64-78] 78 (01/15 1230) Resp:  [11-18] 16 (01/15 1230) BP: (120-155)/(61-72) 137/62 (01/15 1230) SpO2:  [100 %] 100 % (01/15 1000) Weight:  [74.3 kg-75.1 kg] 74.3 kg (01/15 1230)  Weight change:  Filed Weights   06/06/19 1225 06/08/19 0930 06/08/19 1230  Weight: 73.3 kg 75.1 kg 74.3 kg    Intake/Output: No intake/output data recorded.   Intake/Output this shift:  Total I/O In: -  Out: 800 [Other:800]  Physical Exam: General: NAD, sitting in chair  Head: Normocephalic, atraumatic. Moist oral mucosal membranes  Eyes: Anicteric, PERRL  Neck: Supple, trachea midline  Lungs:  Clear to auscultation  Heart: Regular rate and rhythm  Abdomen:  Soft, nontender,   Extremities: no peripheral edema.  Neurologic: Nonfocal, moving all four extremities  Skin: No lesions  Access: Left AVF    Basic Metabolic Panel: Recent Labs  Lab 06/01/19 2124 06/01/19 2124 06/02/19 1945 06/02/19 1945 06/04/19 0740 06/06/19 1004 06/08/19 0949  NA 140  --  138  --  139 139 137  K 4.7  --  5.0  --  5.0 3.9 4.2  CL 99  --  97*  --  100 93* 95*  CO2 25  --  24  --  $R'23 27 25  'HT$ GLUCOSE 106*  --  225*  --  110* 234* 128*  BUN 67*  --  88*  --  111* 96* 66*  CREATININE 7.16*  --  8.52*  --  10.43* 10.03* 7.06*  CALCIUM 9.1   < > 9.0   < > 8.5* 9.5 9.0  PHOS  --   --  6.7*  --  8.8* 9.8* 6.5*   < > = values in this interval not displayed.    Liver Function Tests: Recent Labs  Lab 06/01/19 2124 06/02/19 1945 06/04/19 0740 06/06/19 1004 06/08/19 0949  AST 25  --   --   --   --   ALT 19  --   --   --   --   ALKPHOS 56  --   --   --   --   BILITOT 0.6  --   --   --   --   PROT 8.4*  --   --   --   --   ALBUMIN 4.5 3.6 3.6 4.0  4.0   No results for input(s): LIPASE, AMYLASE in the last 168 hours. Recent Labs  Lab 06/06/19 1817  AMMONIA 21    CBC: Recent Labs  Lab 06/01/19 2124 06/04/19 0500 06/08/19 0949  WBC 8.1 7.3 5.0  NEUTROABS 6.7  --   --   HGB 13.2 12.3* 12.0*  HCT 42.6 37.9* 36.9*  MCV 108.1* 105.3* 100.5*  PLT 158 154 150    Cardiac Enzymes: No results for input(s): CKTOTAL, CKMB, CKMBINDEX, TROPONINI in the last 168 hours.  BNP: Invalid input(s): POCBNP  CBG: Recent Labs  Lab 06/06/19 1640 06/07/19 0649 06/07/19 1123 06/07/19 1624 06/08/19 0659  GLUCAP 100* 93 137* 85 74    Microbiology: Results for orders placed or performed during the hospital encounter of 06/01/19  Respiratory Panel by RT PCR (Flu A&B, Covid) - Nasopharyngeal Swab     Status: None   Collection  Time: 06/01/19  9:52 PM   Specimen: Nasopharyngeal Swab  Result Value Ref Range Status   SARS Coronavirus 2 by RT PCR NEGATIVE NEGATIVE Final    Comment: (NOTE) SARS-CoV-2 target nucleic acids are NOT DETECTED. The SARS-CoV-2 RNA is generally detectable in upper respiratoy specimens during the acute phase of infection. The lowest concentration of SARS-CoV-2 viral copies this assay can detect is 131 copies/mL. A negative result does not preclude SARS-Cov-2 infection and should not be used as the sole basis for treatment or other patient management decisions. A negative result may occur with  improper specimen collection/handling, submission of specimen other than nasopharyngeal swab, presence of viral mutation(s) within the areas targeted by this assay, and inadequate number of viral copies (<131 copies/mL). A negative result must be combined with clinical observations, patient history, and epidemiological information. The expected result is Negative. Fact Sheet for Patients:  PinkCheek.be Fact Sheet for Healthcare Providers:  GravelBags.it This test  is not yet ap proved or cleared by the [place] FDA and  has been authorized for detection and/or diagnosis of SARS-CoV-2 by FDA under an Emergency Use Authorization (EUA). This EUA will remain  in effect (meaning this test can be used) for the duration of the COVID-19 declaration under Section 564(b)(1) of the Act, 21 U.S.C. section 360bbb-3(b)(1), unless the authorization is terminated or revoked sooner.    Influenza A by PCR NEGATIVE NEGATIVE Final   Influenza B by PCR NEGATIVE NEGATIVE Final    Comment: (NOTE) The Xpert Xpress SARS-CoV-2/FLU/RSV assay is intended as an aid in  the diagnosis of influenza from Nasopharyngeal swab specimens and  should not be used as a sole basis for treatment. Nasal washings and  aspirates are unacceptable for Xpert Xpress SARS-CoV-2/FLU/RSV  testing. Fact Sheet for Patients: PinkCheek.be Fact Sheet for Healthcare Providers: GravelBags.it This test is not yet approved or cleared by the [place] FDA and  has been authorized for detection and/or diagnosis of SARS-CoV-2 by  FDA under an Emergency Use Authorization (EUA). This EUA will remain  in effect (meaning this test can be used) for the duration of the  Covid-19 declaration under Section 564(b)(1) of the Act, 21  U.S.C. section 360bbb-3(b)(1), unless the authorization is  terminated or revoked. Performed at [facility], [place] [address] [place], [place] [address]     Coagulation Studies: No results for input(s): LABPROT, INR in the last 72 hours.  Urinalysis: No results for input(s): COLORURINE, LABSPEC, PHURINE, GLUCOSEU, HGBUR, BILIRUBINUR, KETONESUR, PROTEINUR, UROBILINOGEN, NITRITE, LEUKOCYTESUR in the last 72 hours.  Invalid input(s): APPERANCEUR    Imaging: MR BRAIN WO CONTRAST  Result Date: 06/07/2019 CLINICAL DATA:  Initial evaluation for acute psychosis. EXAM: MRI HEAD WITHOUT CONTRAST  TECHNIQUE: Multiplanar, multiecho pulse sequences of the brain and surrounding structures were obtained without intravenous contrast. COMPARISON:  Prior head CT from 03/20/2017. FINDINGS: Brain: Diffuse prominence of the CSF containing spaces compatible with generalized cerebral atrophy, moderate in nature. Hazy T2/FLAIR hyperintensity within the periventricular white matter noted, nonspecific, but most likely related chronic microvascular ischemic disease, mild for age. No abnormal foci of restricted diffusion to suggest acute or subacute ischemia. Gray-white matter differentiation maintained. No encephalomalacia to suggest chronic cortical infarction. No foci of susceptibility artifact to suggest acute or chronic intracranial hemorrhage. No mass lesion, midline shift or mass effect. No hydrocephalus. No extra-axial fluid collection. Pituitary gland suprasellar region normal. Midline structures intact. Vascular: Major intracranial vascular flow voids are well maintained. Skull and upper cervical spine:  Craniocervical junction normal. Bone marrow signal intensity within normal limits. No scalp soft tissue abnormality. Sinuses/Orbits: Globes and orbital soft tissues within normal limits. Paranasal sinuses are largely clear. No mastoid effusion. Inner ear structures grossly normal. Other: None. IMPRESSION: 1. No acute intracranial abnormality. 2. Moderately advanced cerebral atrophy with mild chronic small vessel ischemic disease. Electronically Signed   By: Jeannine Boga M.D.   On: 06/07/2019 00:37     Medications:   . sodium chloride    . sodium chloride     . allopurinol  100 mg Oral Daily  . aspirin  81 mg Oral Daily  . atorvastatin  40 mg Oral q1800  . calcitRIOL  0.25 mcg Oral Once per day on Mon Wed Fri  . docusate sodium  100 mg Oral BID  . heparin  2,000 Units Dialysis Once in dialysis  . heparin  2,000 Units Dialysis Once in dialysis  . NIFEdipine  60 mg Oral Daily  . pioglitazone  30  mg Oral Daily  . risperiDONE  0.5 mg Oral Daily  . risperiDONE  1 mg Oral QHS  . sertraline  100 mg Oral Daily  . sevelamer carbonate  1,600 mg Oral TID WC   sodium chloride, sodium chloride, acetaminophen, alteplase, heparin, lidocaine (PF), lidocaine-prilocaine, pentafluoroprop-tetrafluoroeth  Assessment/ Plan:  [name] is a 74 y.o. white male with end stage renal disease on home hemodialysis, hepatitis C, hypertension, hearing loss with hearing aide, diabetes mellitus type II, depression, who was admitted to [facility] on 06/05/2019 for depression  [facility]) Home Hemodialysis  1. End Stage Renal Disease: seen and examined on hemodialysis treatment.  Placed on MWF schedule while inpatient.   2. Anemia of chronic kidney disease: macrocytic. Hemoglobin 12, macrocytic. No indication for ESA.   3. Hypertension: blood pressure well controlled. Currently off all blood pressure agents.   4. Secondary Hyperparathyroidism:  - sevelamer with meals.  - calcitriol   LOS: [address] 1/15/20212:27 PM

## 2019-06-08 NOTE — [facility] Group Notes (Signed)
Feelings Around Relapse 06/08/2019 1PM  Type of Therapy and Topic:  Group Therapy:  Feelings around Relapse and Recovery  Participation Level:  Minimal   Description of Group:    Patients in this group will discuss emotions they experience before and after a relapse. They will process how experiencing these feelings, or avoidance of experiencing them, relates to having a relapse. Facilitator will guide patients to explore emotions they have related to recovery. Patients will be encouraged to process which emotions are more powerful. They will be guided to discuss the emotional reaction significant others in their lives may have to patients' relapse or recovery. Patients will be assisted in exploring ways to respond to the emotions of others without this contributing to a relapse.  Therapeutic Goals: 1. Patient will identify two or more emotions that lead to a relapse for them 2. Patient will identify two emotions that result when they relapse 3. Patient will identify two emotions related to recovery 4. Patient will demonstrate ability to communicate their needs through discussion and/or role plays   Summary of Patient Progress: Minimal participation, no input provided. Pt completed assignment, left group early and did not return.    Therapeutic Modalities:   Cognitive Behavioral Therapy Solution-Focused Therapy Assertiveness Training Relapse Prevention Therapy   Yvette Rack, LCSW 06/08/2019 2:19 PM

## 2019-06-08 NOTE — Progress Notes (Signed)
Hd started  

## 2019-06-08 NOTE — Plan of Care (Signed)
Patient oriented to unit. Patient's safety is maintained on unit. Patient denies SI/HI/AVH. Patient refused Medications this evening.  Problem: Education: Goal: Knowledge of [facility] Education information/materials will improve Outcome: Not Progressing Goal: Emotional status will improve Outcome: Not Progressing Goal: Mental status will improve Outcome: Not Progressing Goal: Verbalization of understanding the information provided will improve Outcome: Not Progressing

## 2019-06-08 NOTE — Progress Notes (Signed)
Hd completed 

## 2019-06-08 NOTE — Progress Notes (Signed)
CSW spoke with pt regarding discharge plan. Pt continues to decline referral for outpatient mental health treatment reporting he is going to get that from the neurologist. Pt report he has a lot of supports outside of the hospital.   Evalina Field, MSW, LCSW Clinical Social Work 06/08/2019 1:20 PM

## 2019-06-09 NOTE — [facility] Group Notes (Signed)
[facility] LCSW Group Therapy Note  Date/Time: 06/09/2019 @ 1:30pm  Type of Therapy/Topic:  Group Therapy:  Feelings about Diagnosis  Participation Level:  Active   Mood: Pleasant    Description of Group:    This group will allow patients to explore their thoughts and feelings about diagnoses they have received. Patients will be guided to explore their level of understanding and acceptance of these diagnoses. Facilitator will encourage patients to process their thoughts and feelings about the reactions of others to their diagnosis, and will guide patients in identifying ways to discuss their diagnosis with significant others in their lives. This group will be process-oriented, with patients participating in exploration of their own experiences as well as giving and receiving support and challenge from other group members.   Therapeutic Goals: 1. Patient will demonstrate understanding of diagnosis as evidence by identifying two or more symptoms of the disorder:  2. Patient will be able to express two feelings regarding the diagnosis 3. Patient will demonstrate ability to communicate their needs through discussion and/or role plays  Summary of Patient Progress:   Patient arrived to group late but was talkative and engaged. Patient discussed how he feels that he does not belong at the hospital. Patient stated that he does not feel that he is having psychosis. 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 He states that all of this was a misunderstanding and he needs a neurologist not inpatient hospitalization. Patient discussed his frustrations with the IVC process, the doctor, and being in the inpatient hospital.      Therapeutic Modalities:   Cognitive Behavioral Therapy Brief Therapy Feelings Identification   Ardelle Anton, LCSW

## 2019-06-09 NOTE — Plan of Care (Signed)
Denies SI/HI/AVH. Patient is discharged focus. Patient refuses scheduled medication today. Patient denies any anxiety/depression. Patient's Safety is maintained on the unit.    Problem: Education: Goal: Knowledge of [facility] Education information/materials will improve 06/09/2019 0940 by Alyson Locket I, RN Outcome: Progressing 06/08/2019 2310 by Alyson Locket I, RN Outcome: Not Progressing Goal: Emotional status will improve 06/09/2019 0940 by Anson [place], RN Outcome: Progressing 06/08/2019 2310 by Alyson Locket I, RN Outcome: Not Progressing Goal: Mental status will improve 06/09/2019 0940 by Anson [place], RN Outcome: Progressing 06/08/2019 2310 by Anson [place], RN Outcome: Not Progressing Goal: Verbalization of understanding the information provided will improve Outcome: Not Progressing

## 2019-06-09 NOTE — Progress Notes (Signed)
[name] Progress Note  06/09/2019 3:02 PM Scott Jones  MRN:  [id number] Subjective: Patient seen chart reviewed.  74 year old man who seems to have probably an organic delusional illness that has recently started.  Patient has been refusing his Risperdal.  In conversation today his primary concern was how he could be discharged from the hospital.  I advised him that taking his medicine would be a good help because I am concerned that without some kind of intervention he will continue to have problems outside the hospital.  He continues to have no insight whatsoever and to insist that there was nothing wrong with his behavior.  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  Patient remains somewhat disheveled denies suicidal or homicidal ideation.  Affect gets very agitated and worked up and disorganized pretty quickly.  Refuses to listen to my description of his current situation or reasons for treatment. Principal Problem: Psychosis ([place]) Diagnosis: Principal Problem:   Psychosis ([place]) Active Problems:   DM (diabetes mellitus) ([place])   HTN (hypertension)   Allergic rhinitis   History of hepatitis C   Dyslipidemia   Chronic kidney disease (CKD), stage IV (severe) (HCC)   H/O partial nephrectomy   Acute gouty arthritis   ESRD (end stage renal disease) ([place])   Infection of AV graft for dialysis ([facility])   Malnutrition of moderate degree   Osteoporosis   MDD (major depressive disorder), severe ([place])  Total Time spent with patient: 30 minutes  Past Psychiatric History: Patient has a past history of reported PTSD but no other known psychiatric illness or previous hospitalization  Past Medical History:  Past Medical History:  Diagnosis Date  . Allergy   . Anemia   . Asthma   . Chronic kidney disease   . Depression   . Deviated septum   . Diabetes mellitus   . Hearing aid worn     B/L  . Hepatitis C   .  Hypertension   . Infection 03/2018   RIGHT ARM DIALYSIS CATHETER  . Pneumonia   . Wears glasses     Past Surgical History:  Procedure Laterality Date  . AV FISTULA PLACEMENT    . AV FISTULA PLACEMENT Right 09/21/2018   Procedure: ARTERIOVENOUS (AV) FISTULA CREATION RIGHT ARM;  Surgeon: [name], MD;  Location: [place];  Service: Cardiovascular;  Laterality: Right;  . COLONOSCOPY W/ BIOPSIES AND POLYPECTOMY    . EYE SURGERY    . IR FLUORO GUIDE CV LINE RIGHT  03/18/2018  . IR US GUIDE VASC ACCESS RIGHT  03/18/2018  . NEPHRECTOMY     partial - benign tumor  . PARTIAL NEPHRECTOMY  10/08/2011   Left kidney  . TONSILLECTOMY     Family History:  Family History  Problem Relation Age of Onset  . Diabetes Mother   . Hypertension Mother   . COPD Mother   . Asthma Mother   . Diabetes Father   . Diabetes Sister   . Diabetes Brother   . Kidney disease Brother    Family Psychiatric  History: See previous Social History:  Social History   Substance and Sexual Activity  Alcohol Use Yes  . Alcohol/week: 1.0 standard drinks  . Types: 1 Cans of beer per week   Comment: 1 glass wine weekly     Social History   Substance and Sexual Activity  Drug Use Not Currently  .  Types: Marijuana   Comment: Marijuana cookie/candy 1x monthly    Social History   Socioeconomic History  . Marital status: Married    Spouse name: Not on file  . Number of children: Not on file  . Years of education: Not on file  . Highest education level: Not on file  Occupational History  . Not on file  Tobacco Use  . Smoking status: Former Smoker    Packs/day: 1.00    Years: 11.00    Pack years: 11.00    Types: Cigarettes, Pipe    Quit date: 08/07/1974    Years since quitting: 44.8  . Smokeless tobacco: Never Used  Substance and Sexual Activity  . Alcohol use: Yes    Alcohol/week: 1.0 standard drinks    Types: 1 Cans of beer per week    Comment: 1 glass wine weekly  . Drug use: Not  Currently    Types: Marijuana    Comment: Marijuana cookie/candy 1x monthly  . Sexual activity: Yes    Birth control/protection: None  Other Topics Concern  . Not on file  Social History Narrative   ** Merged History Encounter **       Social Determinants of Health   Financial Resource Strain:   . Difficulty of Paying Living Expenses: Not on file  Food Insecurity:   . Worried About Charity fundraiser in the Last Year: Not on file  . Ran Out of Food in the Last Year: Not on file  Transportation Needs:   . Lack of Transportation (Medical): Not on file  . Lack of Transportation (Non-Medical): Not on file  Physical Activity:   . Days of Exercise per Week: Not on file  . Minutes of Exercise per Session: Not on file  Stress:   . Feeling of Stress : Not on file  Social Connections:   . Frequency of Communication with Friends and Family: Not on file  . Frequency of Social Gatherings with Friends and Family: Not on file  . Attends Religious Services: Not on file  . Active Member of Clubs or Organizations: Not on file  . Attends Archivist Meetings: Not on file  . Marital Status: Not on file   Additional Social History:                         Sleep: Fair  Appetite:  Fair  Current Medications: Current Facility-Administered Medications  Medication Dose Route Frequency Provider Last Rate Last Admin  . 0.9 %  sodium chloride infusion  100 mL Intravenous PRN [name], MD      . 0.9 %  sodium chloride infusion  100 mL Intravenous PRN [name], MD      . acetaminophen (TYLENOL) tablet 650 mg  650 mg Oral Q6H PRN [name], NP   650 mg at 06/05/19 2145  . allopurinol (ZYLOPRIM) tablet 100 mg  100 mg Oral Daily [name], [name], MD   100 mg at 06/09/19 0828  . alteplase (CATHFLO ACTIVASE) injection 2 mg  2 mg Intracatheter Once PRN [name], MD      . aspirin chewable tablet 81 mg  81 mg Oral Daily [name], [name], MD   81 mg at  06/09/19 0827  . atorvastatin (LIPITOR) tablet 40 mg  40 mg Oral q1800 [name], [name], MD   40 mg at 06/08/19 1711  . calcitRIOL (ROCALTROL) capsule 0.25 mcg  0.25 mcg Oral Once  per day on Mon Wed Fri [name], [name], MD   0.25 mcg at 06/08/19 1341  . docusate sodium (COLACE) capsule 100 mg  100 mg Oral BID [name], [name], MD   100 mg at 06/09/19 0827  . heparin injection 1,000 Units  1,000 Units Dialysis PRN [name], MD      . heparin injection 2,000 Units  2,000 Units Dialysis Once in dialysis Roney Jaffe, MD      . heparin injection 2,000 Units  2,000 Units Dialysis Once in dialysis [name], MD      . lidocaine (PF) (XYLOCAINE) 1 % injection 5 mL  5 mL Intradermal PRN [name], MD      . lidocaine-prilocaine (EMLA) cream 1 application  1 application Topical PRN [name], MD      . NIFEdipine (ADALAT CC) 24 hr tablet 60 mg  60 mg Oral Daily [name], [name], MD   60 mg at 06/09/19 0827  . pentafluoroprop-tetrafluoroeth (GEBAUERS) aerosol 1 application  1 application Topical PRN [name], MD      . pioglitazone (ACTOS) tablet 30 mg  30 mg Oral Daily [name], [name], MD   30 mg at 06/09/19 0827  . risperiDONE (RISPERDAL) tablet 0.5 mg  0.5 mg Oral Daily [name], [name], MD   0.5 mg at 06/07/19 1743  . risperiDONE (RISPERDAL) tablet 1 mg  1 mg Oral QHS [name], [name], MD      . sertraline (ZOLOFT) tablet 100 mg  100 mg Oral Daily [name], NP   100 mg at 06/09/19 0827  . sevelamer carbonate (RENVELA) tablet 1,600 mg  1,600 mg Oral TID WC Kolluru, Sarath, MD   1,600 mg at 06/09/19 1211    Lab Results:  Results for orders placed or performed during the hospital encounter of 06/05/19 (from the past 48 hour(s))  Glucose, capillary     Status: None   Collection Time: 06/07/19  4:24 PM  Result Value Ref Range   Glucose-Capillary 85 70 - 99 mg/dL   Comment 1 Notify RN   Glucose, capillary     Status: None   Collection Time: 06/08/19   6:59 AM  Result Value Ref Range   Glucose-Capillary 74 70 - 99 mg/dL  Renal function panel     Status: Abnormal   Collection Time: 06/08/19  9:49 AM  Result Value Ref Range   Sodium 137 135 - 145 mmol/L   Potassium 4.2 3.5 - 5.1 mmol/L   Chloride 95 (L) 98 - 111 mmol/L   CO2 25 22 - 32 mmol/L   Glucose, Bld 128 (H) 70 - 99 mg/dL   BUN 66 (H) 8 - 23 mg/dL   Creatinine, Ser 7.06 (H) 0.61 - 1.24 mg/dL   Calcium 9.0 8.9 - 10.3 mg/dL   Phosphorus 6.5 (H) 2.5 - 4.6 mg/dL   Albumin 4.0 3.5 - 5.0 g/dL   GFR calc non Af Amer 7 (L) >60 mL/min   GFR calc Af Amer 8 (L) >60 mL/min   Anion gap 17 (H) 5 - 15    Comment: Performed at [facility], [address] [place], [place] [address]  CBC     Status: Abnormal   Collection Time: 06/08/19  9:49 AM  Result Value Ref Range   WBC 5.0 4.0 - 10.5 K/uL   RBC 3.67 (L) 4.22 - 5.81 MIL/uL   Hemoglobin 12.0 (L) 13.0 - 17.0 g/dL   HCT 36.9 (  L) 39.0 - 52.0 %   MCV 100.5 (H) 80.0 - 100.0 fL   MCH 32.7 26.0 - 34.0 pg   MCHC 32.5 30.0 - 36.0 g/dL   RDW 13.2 11.5 - 15.5 %   Platelets 150 150 - 400 K/uL   nRBC 0.0 0.0 - 0.2 %    Comment: Performed at [facility], [address] [place], [place] [address]    Blood Alcohol level:  Lab Results  Component Value Date   [facility] <10 29/47/6546    Metabolic Disorder Labs: Lab Results  Component Value Date   HGBA1C 5.2 06/01/2019   MPG 102.54 06/01/2019   MPG 105 07/26/2016   No results found for: PROLACTIN Lab Results  Component Value Date   CHOL 131 07/26/2016   TRIG 137.0 07/26/2016   HDL 38.90 (L) 07/26/2016   CHOLHDL 3 07/26/2016   VLDL 27.4 07/26/2016   LDLCALC 65 07/26/2016   LDLCALC 92 08/07/2015    Physical Findings: AIMS:  , ,  ,  ,    CIWA:    COWS:     Musculoskeletal: Strength & Muscle Tone: within normal limits Gait & Station: normal Patient leans: N/A  Psychiatric Specialty Exam: Physical Exam  Nursing note and vitals reviewed. Constitutional: He  appears well-developed and well-nourished.  HENT:  Head: Normocephalic and atraumatic.  Eyes: Pupils are equal, round, and reactive to light. Conjunctivae are normal.  Cardiovascular: Regular rhythm and normal heart sounds.  Respiratory: Effort normal. No respiratory distress.  GI: Soft.  Musculoskeletal:        General: Normal range of motion.     Cervical back: Normal range of motion.  Neurological: He is alert.  Skin: Skin is warm and dry.  Psychiatric: His affect is labile. His speech is tangential. He is agitated. He is not aggressive. Thought content is delusional. Cognition and memory are impaired. He expresses impulsivity and inappropriate judgment.    Review of Systems  Constitutional: Negative.   HENT: Negative.   Eyes: Negative.   Respiratory: Negative.   Cardiovascular: Negative.   Gastrointestinal: Negative.   Musculoskeletal: Negative.   Skin: Negative.   Neurological: Negative.   Psychiatric/Behavioral: Positive for agitation.    Blood pressure 129/62, pulse 74, temperature 98.6 F (37 C), temperature source Oral, resp. rate 18, height $RemoveBe'5\' 5"'hgcTobxgK$  (1.651 m), weight 74.3 kg, SpO2 98 %.Body mass index is 27.26 kg/m.  General Appearance: Disheveled  Eye Contact:  Fair  Speech:  Pressured  Volume:  Increased  Mood:  Angry and Irritable  Affect:  Inappropriate and Labile  Thought Process:  Disorganized  Orientation:  Full (Time, Place, and Person)  Thought Content:  Illogical, Paranoid Ideation, Rumination and Tangential  Suicidal Thoughts:  No  Homicidal Thoughts:  No  Memory:  Immediate;   Fair Recent;   Poor Remote;   Poor  Judgement:  Impaired  Insight:  Shallow  Psychomotor Activity:  Restlessness  Concentration:  Concentration: Poor  Recall:  Poor  Fund of Knowledge:  Poor  Language:  Fair  Akathisia:  No  Handed:  Right  AIMS (if indicated):     Assets:  Desire for Improvement  ADL's:  Impaired  Cognition:  Impaired,  Mild  Sleep:  Number of Hours:  6.15     Treatment Plan Summary: Daily contact with patient to assess and evaluate symptoms and progress in treatment, Medication management and Plan Patient essentially unchanged.  Not having acute behavior problems in the ward although he does have poor  insight and can get emotionally worked up easily.  Has refused to take any psychiatric medicine.  I will try to contact his wife again today if possible for check-in about how she feels he is doing.  I tried to prevail on the patient please take his medicine.  Not clear if we will be able to make much of a difference at this point if he is going to refuse.  Alethia Berthold, MD 06/09/2019, 3:02 PM

## 2019-06-10 NOTE — Progress Notes (Signed)
D: Patient refused Risperdal and insisted he is not psychotic. He said he entered a house, that he had been given instructions on how to enter, stating he thought it was Dr. 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  A: Continue to monitor for safety R: Safety maintained.

## 2019-06-10 NOTE — Progress Notes (Signed)
[facility] MD Progress Note  06/10/2019 11:58 AM [place]  MRN:  [id number] Subjective: Follow-up for this 74 year old gentleman with new onset delusional behavior likely related to organic features.  Patient seen and chart reviewed.  Patient started our interview paranoid and critical.  I apologized to him in general and allowed him to ventilate at length.  After doing so I acknowledged to him that I understood that he did not believe that he had done anything wrong or inappropriate and that he disagrees with the belief that he has had psychotic symptoms.  I asked him whether he could even give the slightest bit of consideration to the possibility that he is mistaken in his believes and that what happened really was delusional.  Patient absolutely positively refused to believe that there was any possibility of this.  Nevertheless he has not shown any aggression threatening behavior or problem behavior here on the unit. Principal Problem: Psychosis ([place]) Diagnosis: Principal Problem:   Psychosis ([place]) Active Problems:   DM (diabetes mellitus) ([place])   HTN (hypertension)   Allergic rhinitis   History of hepatitis C   Dyslipidemia   Chronic kidney disease (CKD), stage IV (severe) (HCC)   H/O partial nephrectomy   Acute gouty arthritis   ESRD (end stage renal disease) ([place])   Infection of AV graft for dialysis (HCC)   Malnutrition of moderate degree   Osteoporosis   MDD (major depressive disorder), severe ([place])  Total Time spent with patient: 30 minutes  Past Psychiatric History: Patient has a history of PTSD some anxiety no previous hospitalizations no previous psychosis  Past Medical History:  Past Medical History:  Diagnosis Date  . Allergy   . Anemia   . Asthma   . Chronic kidney disease   . Depression   . Deviated septum   . Diabetes mellitus   . Hearing aid worn     B/L  . Hepatitis C   . Hypertension   . Infection 03/2018   RIGHT ARM DIALYSIS CATHETER  . Pneumonia   .  Wears glasses     Past Surgical History:  Procedure Laterality Date  . AV FISTULA PLACEMENT    . AV FISTULA PLACEMENT Right 09/21/2018   Procedure: ARTERIOVENOUS (AV) FISTULA CREATION RIGHT ARM;  Surgeon: [name], MD;  Location: [place];  Service: Cardiovascular;  Laterality: Right;  . COLONOSCOPY W/ BIOPSIES AND POLYPECTOMY    . EYE SURGERY    . IR FLUORO GUIDE CV LINE RIGHT  03/18/2018  . IR US GUIDE VASC ACCESS RIGHT  03/18/2018  . NEPHRECTOMY     partial - benign tumor  . PARTIAL NEPHRECTOMY  10/08/2011   Left kidney  . TONSILLECTOMY     Family History:  Family History  Problem Relation Age of Onset  . Diabetes Mother   . Hypertension Mother   . COPD Mother   . Asthma Mother   . Diabetes Father   . Diabetes Sister   . Diabetes Brother   . Kidney disease Brother    Family Psychiatric  History: See previous Social History:  Social History   Substance and Sexual Activity  Alcohol Use Yes  . Alcohol/week: 1.0 standard drinks  . Types: 1 Cans of beer per week   Comment: 1 glass wine weekly     Social History   Substance and Sexual Activity  Drug Use Not Currently  . Types: Marijuana   Comment: Marijuana cookie/candy 1x monthly    Social History  Socioeconomic History  . Marital status: Married    Spouse name: Not on file  . Number of children: Not on file  . Years of education: Not on file  . Highest education level: Not on file  Occupational History  . Not on file  Tobacco Use  . Smoking status: Former Smoker    Packs/day: 1.00    Years: 11.00    Pack years: 11.00    Types: Cigarettes, Pipe    Quit date: 08/07/1974    Years since quitting: 44.8  . Smokeless tobacco: Never Used  Substance and Sexual Activity  . Alcohol use: Yes    Alcohol/week: 1.0 standard drinks    Types: 1 Cans of beer per week    Comment: 1 glass wine weekly  . Drug use: Not Currently    Types: Marijuana    Comment: Marijuana cookie/candy 1x monthly  . Sexual  activity: Yes    Birth control/protection: None  Other Topics Concern  . Not on file  Social History Narrative   ** Merged History Encounter **       Social Determinants of Health   Financial Resource Strain:   . Difficulty of Paying Living Expenses: Not on file  Food Insecurity:   . Worried About Charity fundraiser in the Last Year: Not on file  . Ran Out of Food in the Last Year: Not on file  Transportation Needs:   . Lack of Transportation (Medical): Not on file  . Lack of Transportation (Non-Medical): Not on file  Physical Activity:   . Days of Exercise per Week: Not on file  . Minutes of Exercise per Session: Not on file  Stress:   . Feeling of Stress : Not on file  Social Connections:   . Frequency of Communication with Friends and Family: Not on file  . Frequency of Social Gatherings with Friends and Family: Not on file  . Attends Religious Services: Not on file  . Active Member of Clubs or Organizations: Not on file  . Attends Archivist Meetings: Not on file  . Marital Status: Not on file   Additional Social History:                         Sleep: Fair  Appetite:  Fair  Current Medications: Current Facility-Administered Medications  Medication Dose Route Frequency Provider Last Rate Last Admin  . 0.9 %  sodium chloride infusion  100 mL Intravenous PRN [name], MD      . 0.9 %  sodium chloride infusion  100 mL Intravenous PRN [name], MD      . acetaminophen (TYLENOL) tablet 650 mg  650 mg Oral Q6H PRN [name], NP   650 mg at 06/10/19 1020  . allopurinol (ZYLOPRIM) tablet 100 mg  100 mg Oral Daily Phong Isenberg, [name], MD   100 mg at 06/10/19 0806  . alteplase (CATHFLO ACTIVASE) injection 2 mg  2 mg Intracatheter Once PRN [name], MD      . aspirin chewable tablet 81 mg  81 mg Oral Daily Taysia Rivere, [name], MD   81 mg at 06/10/19 0806  . atorvastatin (LIPITOR) tablet 40 mg  40 mg Oral q1800 Caesar Mannella, [name],  MD   40 mg at 06/09/19 1733  . calcitRIOL (ROCALTROL) capsule 0.25 mcg  0.25 mcg Oral Once per day on Mon Wed Fri Garrette Caine, [name], MD   0.25 mcg at 06/08/19  1341  . docusate sodium (COLACE) capsule 100 mg  100 mg Oral BID Nicoya Friel T, MD   100 mg at 06/10/19 2992  . heparin injection 1,000 Units  1,000 Units Dialysis PRN [name], MD      . heparin injection 2,000 Units  2,000 Units Dialysis Once in dialysis Roney Jaffe, MD      . heparin injection 2,000 Units  2,000 Units Dialysis Once in dialysis [name], MD      . lidocaine (PF) (XYLOCAINE) 1 % injection 5 mL  5 mL Intradermal PRN [name], MD      . lidocaine-prilocaine (EMLA) cream 1 application  1 application Topical PRN [name], MD      . NIFEdipine (ADALAT CC) 24 hr tablet 60 mg  60 mg Oral Daily Zyanna Leisinger, [name], MD   60 mg at 06/10/19 0806  . pentafluoroprop-tetrafluoroeth (GEBAUERS) aerosol 1 application  1 application Topical PRN [name], MD      . pioglitazone (ACTOS) tablet 30 mg  30 mg Oral Daily Sunya Humbarger, [name], MD   30 mg at 06/10/19 0806  . risperiDONE (RISPERDAL) tablet 0.5 mg  0.5 mg Oral Daily Ubah Radke T, MD   0.5 mg at 06/10/19 0806  . risperiDONE (RISPERDAL) tablet 1 mg  1 mg Oral QHS Verl Whitmore T, MD      . sertraline (ZOLOFT) tablet 100 mg  100 mg Oral Daily [name], NP   100 mg at 06/10/19 0806  . sevelamer carbonate (RENVELA) tablet 1,600 mg  1,600 mg Oral TID WC Kolluru, Sarath, MD   1,600 mg at 06/10/19 1142    Lab Results: No results found for this or any previous visit (from the past 48 hour(s)).  Blood Alcohol level:  Lab Results  Component Value Date   ETH <10 42/68/3419    Metabolic Disorder Labs: Lab Results  Component Value Date   HGBA1C 5.2 06/01/2019   MPG 102.54 06/01/2019   MPG 105 07/26/2016   No results found for: PROLACTIN Lab Results  Component Value Date   CHOL 131 07/26/2016   TRIG 137.0 07/26/2016   HDL 38.90  (L) 07/26/2016   CHOLHDL 3 07/26/2016   VLDL 27.4 07/26/2016   LDLCALC 65 07/26/2016   LDLCALC 92 08/07/2015    Physical Findings: AIMS:  , ,  ,  ,    CIWA:    COWS:     Musculoskeletal: Strength & Muscle Tone: within normal limits Gait & Station: normal Patient leans: N/A  Psychiatric Specialty Exam: Physical Exam  Nursing note and vitals reviewed. Constitutional: He appears well-developed and well-nourished.  HENT:  Head: Normocephalic and atraumatic.  Eyes: Pupils are equal, round, and reactive to light. Conjunctivae are normal.  Cardiovascular: Regular rhythm and normal heart sounds.  Respiratory: Effort normal. No respiratory distress.  GI: Soft.  Musculoskeletal:        General: Normal range of motion.     Cervical back: Normal range of motion.  Neurological: He is alert.  Skin: Skin is warm and dry.  Psychiatric: His speech is normal. His affect is labile. He is agitated. He is not aggressive. Thought content is delusional. He expresses impulsivity. He exhibits abnormal recent memory.    Review of Systems  Constitutional: Negative.   HENT: Negative.   Eyes: Negative.   Respiratory: Negative.   Cardiovascular: Negative.   Gastrointestinal: Negative.   Musculoskeletal: Negative.   Skin: Negative.   Neurological: Negative.  Psychiatric/Behavioral: The patient is nervous/anxious.     Blood pressure (!) 159/64, pulse 75, temperature 98.4 F (36.9 C), temperature source Oral, resp. rate 18, height $RemoveBe'5\' 5"'dyQBlcvip$  (1.651 m), weight 74.3 kg, SpO2 98 %.Body mass index is 27.26 kg/m.  General Appearance: Disheveled  Eye Contact:  Fair  Speech:  Clear and Coherent  Volume:  Increased  Mood:  Irritable  Affect:  Congruent  Thought Process:  Coherent  Orientation:  Full (Time, Place, and Person)  Thought Content:  Illogical and Paranoid Ideation  Suicidal Thoughts:  No  Homicidal Thoughts:  No  Memory:  Immediate;   Fair Recent;   Poor Remote;   Fair  Judgement:   Impaired  Insight:  Shallow  Psychomotor Activity:  Normal  Concentration:  Concentration: Fair  Recall:  [employer] of Knowledge:  Fair  Language:  Fair  Akathisia:  No  Handed:  Right  AIMS (if indicated):     Assets:  Desire for Improvement Housing Resilience Social Support  ADL's:  Intact  Cognition:  Impaired,  Mild  Sleep:  Number of Hours: 4     Treatment Plan Summary: Daily contact with patient to assess and evaluate symptoms and progress in treatment, Medication management and Plan 74 year old man with subacute onset of delusions and odd thinking with a recent episode of delusionally driven behavior that got him trouble with the police.  Here in the hospital he has not shown any dangerous behavior and he has never made any threatening or suicidal statements.  He has been hesitant to cooperate with medication although when I inquired about this with him he insisted that he would be agreeable to the medicine.  Patient may be about as much better as he is likely to get in this hospital setting.  I spoke with his wife yesterday again and she understands that this is likely to be an ongoing process.  I am probably going to prepare for discharge tomorrow.  Alethia Berthold, MD 06/10/2019, 11:58 AM

## 2019-06-10 NOTE — BHH Group Notes (Signed)
[facility] Group Notes:  (Nursing/MHT/Case Management/Adjunct)  Date:  06/10/2019  Time:  5:10 PM  Type of Therapy:  Psychoeducational Skills  Participation Level:  Active  Participation Quality:  Appropriate and Attentive  Affect:  Appropriate  Cognitive:  Alert and Appropriate  Insight:  Appropriate  Engagement in Group:  Engaged  Modes of Intervention:  Activity and Socialization  Summary of Progress/Problems:  Adela Lank Ottavio Norem 06/10/2019, 5:10 PM

## 2019-06-10 NOTE — Plan of Care (Signed)
D: Pt. During assessments this morning denies si/hi/avh, able to verbalize safety on the unit. Pt. When speaking with this writer is disorganized in his thought process and verbalizes poor insight into why he is here on the unit. Pt. Denies being depressed or anxious, but visibly appears some-what anxious and irritable during periods of our conversation on why he is here at the facility. Pt. Endorses pain in his foot stating he, "stubbed my toe", given PRN medicine for this. Pt. Verbalizes understanding of fluids restrictions in place. Pt. Dialysis fistula WDL. Pt. Endorses a normal mood, denies anything is wrong with him stating, "I'm not psychotic".   A: Q x 15 minute observation checks in place for safety. Patient was and is provided with education throughout shift when appropriate.  Patient was and will be given/offered medications per orders and when appropriate. Patient was and is encouraged to attend groups, participate in unit activities and continue with plan of care. Pt. Chart and plans of care reviewed. Pt. Given support and encouragement when appropriate.    R: Patient is complaint with medication and unit procedures with direction and encouragement. Pt. Observed eating good.       Problem: Safety: Goal: Ability to remain free from injury will improve 06/10/2019 1240 by Reyes Ivan, RN Outcome: Progressing 06/10/2019 1239 by Reyes Ivan, RN Outcome: Progressing   Problem: Safety: Goal: Periods of time without injury will increase 06/10/2019 1240 by Reyes Ivan, RN Outcome: Progressing 06/10/2019 1239 by Reyes Ivan, RN Outcome: Progressing   Problem: Education: Goal: Mental status will improve 06/10/2019 1240 by Reyes Ivan, RN Outcome: Not Progressing 06/10/2019 1239 by Reyes Ivan, RN Outcome: Progressing   Problem: Education: Goal: Emotional status will improve 06/10/2019 1240 by Reyes Ivan, RN Outcome: Progressing 06/10/2019 1239 by  Reyes Ivan, RN Outcome: Progressing

## 2019-06-10 NOTE — BHH Group Notes (Signed)
LCSW Aftercare Discharge Planning Group Note  06/10/2019  Type of Group and Topic: Psychoeducational Group: Discharge Planning  Participation Level: Minimal  Description of Group  Discharge planning group reviews patient's anticipated discharge plans and assists patients to anticipate and address any barriers to wellness/recovery in the community. Suicide prevention education is reviewed with patients in group.  Therapeutic Goals  1. Patients will state their anticipated discharge plan and mental health aftercare  2. Patients will identify potential barriers to wellness in the community setting  3. Patients will engage in problem solving, solution focused discussion of ways to anticipate and address barriers to wellness/recovery  Summary of Patient Progress  Plan for Discharge/Comments: Scott Jones remained present throughout group. He listened to his peers but did not contribute to the discussion.  Therapeutic Modalities:  [place], [place]  06/10/2019 1:57 PM

## 2019-06-10 NOTE — Plan of Care (Signed)
  Problem: Education: Goal: Knowledge of [facility] Education information/materials will improve Outcome: Not Progressing Goal: Emotional status will improve Outcome: Not Progressing Goal: Mental status will improve Outcome: Not Progressing Goal: Verbalization of understanding the information provided will improve Outcome: Not Progressing  D: Patient refused Risperdal and insisted he is not psychotic. He said he entered a house, that he had been given instructions on how to enter, stating he thought it was Dr. 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  A: Continue to monitor for safety R: Safety maintained.

## 2019-06-11 LAB — RENAL FUNCTION PANEL
Albumin: 4.1 g/dL (ref 3.5–5.0)
Anion gap: 14 (ref 5–15)
BUN: 46 mg/dL — ABNORMAL HIGH (ref 8–23)
CO2: 29 mmol/L (ref 22–32)
Calcium: 9.2 mg/dL (ref 8.9–10.3)
Chloride: 96 mmol/L — ABNORMAL LOW (ref 98–111)
Creatinine, Ser: 5 mg/dL — ABNORMAL HIGH (ref 0.61–1.24)
GFR calc Af Amer: 12 mL/min — ABNORMAL LOW (ref >60)
GFR calc non Af Amer: 11 mL/min — ABNORMAL LOW (ref >60)
Glucose, Bld: 85 mg/dL (ref 70–99)
Phosphorus: 4.1 mg/dL (ref 2.5–4.6)
Potassium: 4.1 mmol/L (ref 3.5–5.1)
Sodium: 139 mmol/L (ref 135–145)

## 2019-06-11 LAB — CBC
HCT: 38.1 % — ABNORMAL LOW (ref 39.0–52.0)
Hemoglobin: 12.3 g/dL — ABNORMAL LOW (ref 13.0–17.0)
MCH: 33.1 pg (ref 26.0–34.0)
MCHC: 32.3 g/dL (ref 30.0–36.0)
MCV: 102.4 fL — ABNORMAL HIGH (ref 80.0–100.0)
Platelets: 161 10*3/uL (ref 150–400)
RBC: 3.72 MIL/uL — ABNORMAL LOW (ref 4.22–5.81)
RDW: 12.8 % (ref 11.5–15.5)
WBC: 5.9 10*3/uL (ref 4.0–10.5)
nRBC: 0 % (ref 0.0–0.2)

## 2019-06-11 MED ORDER — RISPERIDONE 0.5 MG PO TABS: 0.5000 mg | ORAL_TABLET | Freq: Every day | ORAL | 1 refills | Status: AC

## 2019-06-11 MED ORDER — ALLOPURINOL 100 MG PO TABS: 100.0000 mg | ORAL_TABLET | Freq: Every day | ORAL | 1 refills | Status: AC

## 2019-06-11 MED ORDER — PIOGLITAZONE HCL 30 MG PO TABS: 30.0000 mg | ORAL_TABLET | Freq: Every day | ORAL | 1 refills | Status: AC

## 2019-06-11 MED ORDER — NIFEDIPINE ER 60 MG PO TB24: 60.0000 mg | ORAL_TABLET | Freq: Every day | ORAL | 1 refills | Status: AC

## 2019-06-11 MED ORDER — ATORVASTATIN CALCIUM 40 MG PO TABS: 40.0000 mg | ORAL_TABLET | Freq: Every day | ORAL | 1 refills | Status: AC

## 2019-06-11 MED ORDER — RISPERIDONE 1 MG PO TABS: 1.0000 mg | ORAL_TABLET | Freq: Every day | ORAL | 1 refills | Status: AC

## 2019-06-11 MED ORDER — SEVELAMER CARBONATE 800 MG PO TABS: 1600.0000 mg | ORAL_TABLET | Freq: Three times a day (TID) | ORAL | 1 refills | Status: AC

## 2019-06-11 MED ORDER — SERTRALINE HCL 100 MG PO TABS: 100.0000 mg | ORAL_TABLET | Freq: Every day | ORAL | 1 refills | Status: AC

## 2019-06-11 NOTE — BHH Suicide Risk Assessment (Signed)
[facility] Discharge Suicide Risk Assessment   Principal Problem: Psychosis [facility]) Discharge Diagnoses: Principal Problem:   Psychosis ([place]) Active Problems:   DM (diabetes mellitus) ([place])   HTN (hypertension)   Allergic rhinitis   History of hepatitis C   Dyslipidemia   Chronic kidney disease (CKD), stage IV (severe) (HCC)   H/O partial nephrectomy   Acute gouty arthritis   ESRD (end stage renal disease) ([place])   Infection of AV graft for dialysis ([place])   Malnutrition of moderate degree   Osteoporosis   MDD (major depressive disorder), severe (HCC)   Total Time spent with patient: 30 minutes  Musculoskeletal: Strength & Muscle Tone: within normal limits Gait & Station: normal Patient leans: N/A  Psychiatric Specialty Exam: Review of Systems  Constitutional: Negative.   HENT: Negative.   Eyes: Negative.   Respiratory: Negative.   Cardiovascular: Negative.   Gastrointestinal: Negative.   Musculoskeletal: Negative.   Skin: Negative.   Neurological: Negative.   Psychiatric/Behavioral: Negative.     Blood pressure 134/62, pulse 87, temperature 98.9 F (37.2 C), temperature source Oral, resp. rate 18, height $RemoveBe'5\' 5"'uvZTQdQne$  (1.651 m), weight 74.3 kg, SpO2 99 %.Body mass index is 27.26 kg/m.  General Appearance: Disheveled  Eye Contact::  Good  Speech:  Clear and [id number]  Volume:  Normal  Mood:  Euthymic  Affect:  Congruent  Thought Process:  Goal Directed  Orientation:  Full (Time, Place, and Person)  Thought Content:  Logical  Suicidal Thoughts:  No  Homicidal Thoughts:  No  Memory:  Immediate;   Fair Recent;   Fair Remote;   Fair  Judgement:  Fair  Insight:  Fair  Psychomotor Activity:  Normal  Concentration:  Fair  Recall:  [employer] of [place]  Language: Fair  Akathisia:  No  Handed:  Right  AIMS (if indicated):     Assets:  Desire for Improvement Housing Resilience Social Support  Sleep:  Number of Hours: 3  Cognition: Impaired,  Mild  ADL's:   Intact   Mental Status Per Nursing Assessment::   On Admission:  NA  Demographic Factors:  Male, Age 75 or older and Caucasian  Loss Factors: Decline in physical health  Historical Factors: NA  Risk Reduction Factors:   Sense of responsibility to family, Living with another person, especially a relative, Positive social support and Positive therapeutic relationship  Continued Clinical Symptoms:  Medical Diagnoses and Treatments/Surgeries  Cognitive Features That Contribute To Risk:  Thought constriction (tunnel vision)    Suicide Risk:  Minimal: No identifiable suicidal ideation.  Patients presenting with no risk factors but with morbid ruminations; may be classified as minimal risk based on the severity of the depressive symptoms  Follow-up Information    pt declined referral Follow up.   Why: Pt declined stating plans to follow up with his neurologist. Contact information: Pt declined referral          Plan Of Care/Follow-up recommendations:  Activity:  Activity as tolerated Diet:  Heart healthy diet or as recommended by dialysis Other:  Follow-up with outpatient treatment by neurologist or psychiatrist continue current medicine.  Alethia Berthold, MD 06/11/2019, 9:24 AM

## 2019-06-11 NOTE — Progress Notes (Signed)
D: Patient has been medication compliant. Denies SI, HI and AVH. Wife visited. States that she can pick patient up this afternoon if he is discharged today. A: Continue to monitor for safety R: Safety maintained.

## 2019-06-11 NOTE — Discharge Summary (Signed)
Physician Discharge Summary Note  Patient:  Scott Jones is an 74 y.o., male MRN:  [id number] DOB:  1945-06-09 Patient phone:  [phone] (home)  Patient address:    [place] [address],  Total Time spent with patient: 30 minutes  Date of Admission:  06/05/2019 Date of Discharge: June 11, 2019  Reason for Admission: Patient was admitted after presenting under IVC for disruptive agitated behavior based on delusions outside the hospital.  Principal Problem: Psychosis [facility]) Discharge Diagnoses: Principal Problem:   Psychosis ([place]) Active Problems:   DM (diabetes mellitus) ([place])   HTN (hypertension)   Allergic rhinitis   History of hepatitis C   Dyslipidemia   Chronic kidney disease (CKD), stage IV (severe) ([place])   H/O partial nephrectomy   Acute gouty arthritis   ESRD (end stage renal disease) ([place])   Infection of AV graft for dialysis ([place])   Malnutrition of moderate degree   Osteoporosis   MDD (major depressive disorder), severe ([place])   Past Psychiatric History: Patient had a past history of a diagnosis of PTSD but no history of psychosis or hospitalization  Past Medical History:  Past Medical History:  Diagnosis Date  . Allergy   . Anemia   . Asthma   . Chronic kidney disease   . Depression   . Deviated septum   . Diabetes mellitus   . Hearing aid worn     B/L  . Hepatitis C   . Hypertension   . Infection 03/2018   RIGHT ARM DIALYSIS CATHETER  . Pneumonia   . Wears glasses     Past Surgical History:  Procedure Laterality Date  . AV FISTULA PLACEMENT    . AV FISTULA PLACEMENT Right 09/21/2018   Procedure: ARTERIOVENOUS (AV) FISTULA CREATION RIGHT ARM;  Surgeon: [name], MD;  Location: [place];  Service: Cardiovascular;  Laterality: Right;  . COLONOSCOPY W/ BIOPSIES AND POLYPECTOMY    . EYE SURGERY    . IR FLUORO GUIDE CV LINE RIGHT  03/18/2018  . IR US GUIDE VASC ACCESS RIGHT  03/18/2018  . NEPHRECTOMY      partial - benign tumor  . PARTIAL NEPHRECTOMY  10/08/2011   Left kidney  . TONSILLECTOMY     Family History:  Family History  Problem Relation Age of Onset  . Diabetes Mother   . Hypertension Mother   . COPD Mother   . Asthma Mother   . Diabetes Father   . Diabetes Sister   . Diabetes Brother   . Kidney disease Brother    Family Psychiatric  History: See previous notes Social History:  Social History   Substance and Sexual Activity  Alcohol Use Yes  . Alcohol/week: 1.0 standard drinks  . Types: 1 Cans of beer per week   Comment: 1 glass wine weekly     Social History   Substance and Sexual Activity  Drug Use Not Currently  . Types: Marijuana   Comment: Marijuana cookie/candy 1x monthly    Social History   Socioeconomic History  . Marital status: Married    Spouse name: Not on file  . Number of children: Not on file  . Years of education: Not on file  . Highest education level: Not on file  Occupational History  . Not on file  Tobacco Use  . Smoking status: Former Smoker    Packs/day: 1.00    Years: 11.00    Pack years: 11.00    Types: Cigarettes, Pipe  Quit date: 08/07/1974    Years since quitting: 44.8  . Smokeless tobacco: Never Used  Substance and Sexual Activity  . Alcohol use: Yes    Alcohol/week: 1.0 standard drinks    Types: 1 Cans of beer per week    Comment: 1 glass wine weekly  . Drug use: Not Currently    Types: Marijuana    Comment: Marijuana cookie/candy 1x monthly  . Sexual activity: Yes    Birth control/protection: None  Other Topics Concern  . Not on file  Social History Narrative   ** Merged History Encounter **       Social Determinants of Health   Financial Resource Strain:   . Difficulty of Paying Living Expenses: Not on file  Food Insecurity:   . Worried About Charity fundraiser in the Last Year: Not on file  . Ran Out of Food in the Last Year: Not on file  Transportation Needs:   . Lack of Transportation (Medical):  Not on file  . Lack of Transportation (Non-Medical): Not on file  Physical Activity:   . Days of Exercise per Week: Not on file  . Minutes of Exercise per Session: Not on file  Stress:   . Feeling of Stress : Not on file  Social Connections:   . Frequency of Communication with Friends and Family: Not on file  . Frequency of Social Gatherings with Friends and Family: Not on file  . Attends Religious Services: Not on file  . Active Member of Clubs or Organizations: Not on file  . Attends Archivist Meetings: Not on file  . Marital Status: Not on file    Hospital Course: Admitted to the psychiatric ward.  15-minute checks maintained.  This 74 year old man has had a recent onset of some changes in his thinking.  Initially it was what he described as "dj vu" where he would believe that movies ended differently than they really did or that he had been in places he had never been before.  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  He walked into the home of a stranger believing that it belonged to her.  He continued to argue and wound up with the police being called.  Here in the hospital the patient continued to insist that he was completely in the right.  He insisted that he had every reason to be in that house.  At times he was very belligerent about it and initially refused medication.  Lab work-up showed an MRI with some diffuse small vessel disease and some increased atrophy but no focal deficit.  No other specific lab test abnormal.  After a day or 2 he did agree to take the low-dose of Risperdal although he continued to insist he did not think it would help.  At this time he is not suicidal violent dangerous or threatening and is no longer likely to benefit from hospitalization.  I have spoken to his wife on 2 different occasions doing psychoeducation about how this is likely of organic etiology  and may be a persistent problem.  He is to follow-up with a neurologist.  Patient has not backed off of his delusion but he no longer talks about it spontaneously does not seem agitated about it and denies any plans to act on it any further.  He continues to get hemodialysis on his regular schedule and had no complications from that  in the hospital.  Physical Findings: AIMS:  , ,  ,  ,    CIWA:    COWS:     Musculoskeletal: Strength & Muscle Tone: within normal limits Gait & Station: normal Patient leans: N/A  Psychiatric Specialty Exam: Physical Exam  Nursing note and vitals reviewed. Constitutional: He appears well-developed and well-nourished.  HENT:  Head: Normocephalic and atraumatic.  Eyes: Pupils are equal, round, and reactive to light. Conjunctivae are normal.  Cardiovascular: Regular rhythm and normal heart sounds.  Respiratory: Effort normal.  GI: Soft.  Musculoskeletal:        General: Normal range of motion.     Cervical back: Normal range of motion.  Neurological: He is alert.  Skin: Skin is warm and dry.  Psychiatric: He has a normal mood and affect. His speech is normal and behavior is normal. Judgment and thought content normal. Cognition and memory are normal.    Review of Systems  Constitutional: Negative.   HENT: Negative.   Eyes: Negative.   Respiratory: Negative.   Cardiovascular: Negative.   Gastrointestinal: Negative.   Musculoskeletal: Negative.   Skin: Negative.   Neurological: Negative.   Psychiatric/Behavioral: Negative.     Blood pressure 125/66, pulse 71, temperature 97.6 F (36.4 C), temperature source Oral, resp. rate 16, height '5\' 5"'$  (1.651 m), weight 76.2 kg, SpO2 100 %.Body mass index is 27.96 kg/m.  General Appearance: Casual  Eye Contact:  Good  Speech:  Clear and Coherent  Volume:  Normal  Mood:  Euthymic  Affect:  Congruent  Thought Process:  Coherent  Orientation:  Full (Time, Place, and Person)  Thought Content:  Logical   Suicidal Thoughts:  No  Homicidal Thoughts:  No  Memory:  Immediate;   Fair Recent;   Fair Remote;   Fair  Judgement:  Fair  Insight:  Fair  Psychomotor Activity:  Normal  Concentration:  Concentration: Fair  Recall:  [place] of Knowledge:  Fair  Language:  Fair  Akathisia:  No  Handed:  Right  AIMS (if indicated):     Assets:  Desire for Improvement  ADL's:  Intact  Cognition:  Impaired,  Mild  Sleep:  Number of Hours: 3     Have you used any form of tobacco in the last 30 days? (Cigarettes, Smokeless Tobacco, Cigars, and/or Pipes): No  Has this patient used any form of tobacco in the last 30 days? (Cigarettes, Smokeless Tobacco, Cigars, and/or Pipes) Yes, No  Blood Alcohol level:  Lab Results  Component Value Date   ETH <10 48/18/5631    Metabolic Disorder Labs:  Lab Results  Component Value Date   HGBA1C 5.2 06/01/2019   MPG 102.54 06/01/2019   MPG 105 07/26/2016   No results found for: PROLACTIN Lab Results  Component Value Date   CHOL 131 07/26/2016   TRIG 137.0 07/26/2016   HDL 38.90 (L) 07/26/2016   CHOLHDL 3 07/26/2016   VLDL 27.4 07/26/2016   LDLCALC 65 07/26/2016   [place] 92 08/07/2015    See Psychiatric Specialty Exam and Suicide Risk Assessment completed by Attending Physician prior to discharge.  Discharge destination:  Home  Is patient on multiple antipsychotic therapies at discharge:  No   Has Patient had three or more failed trials of antipsychotic monotherapy by history:  No  Recommended Plan for Multiple Antipsychotic Therapies: NA  Discharge Instructions    Diet - low sodium heart healthy   Complete by: As directed    Increase activity slowly  Complete by: As directed      Allergies as of 06/11/2019      Reactions   Wellbutrin [bupropion] Anxiety   Hyper/anxious      Medication List    STOP taking these medications   meloxicam 7.5 MG tablet Commonly known as: Mobic   methocarbamol 500 MG tablet Commonly known as:  Robaxin     TAKE these medications     Indication  allopurinol 100 MG tablet Commonly known as: ZYLOPRIM Take 1 tablet (100 mg total) by mouth daily.  Indication: Gout   aspirin 81 MG tablet Take 81 mg by mouth daily.  Indication: Per PCP   atorvastatin 40 MG tablet Commonly known as: LIPITOR Take 1 tablet (40 mg total) by mouth daily at 6 PM. What changed: when to take this  Indication: High Amount of Fats in the Blood   blood glucose meter kit and supplies Pt requesting ONE TOUCH. Use up to 3 times daily as directed. (FOR ICD-10 E10.9, E11.9).  Indication: Diabetes Type 2   calcitRIOL 0.25 MCG capsule Commonly known as: ROCALTROL Take 0.25 mcg by mouth 3 (three) times a week.  Indication: Per PCP   DIALYVITE TABLET Tabs Take 1 tablet by mouth at bedtime.  Indication: Supplement   docusate sodium 100 MG capsule Commonly known as: COLACE Take 100 mg by mouth daily.  Indication: Constipation   glucose blood test strip Use as directed once a day.  Dx code E11.9  Indication: Diabetes   NIFEdipine 60 MG 24 hr tablet Commonly known as: ADALAT CC Take 1 tablet (60 mg total) by mouth daily. Start taking on: June 12, 2019 What changed: when to take this  Indication: High Blood Pressure Disorder   ONE TOUCH ULTRA 2 w/Device Kit Use as directed once a day.  E11.9  Indication: Diabetes Type 2   OneTouch Delica Lancets 56O Misc 1 each by Does not apply route daily. Dx code E11.9  Indication: Diabetes   pioglitazone 30 MG tablet Commonly known as: ACTOS Take 1 tablet (30 mg total) by mouth daily.  Indication: Type 2 Diabetes   polyvinyl alcohol 1.4 % ophthalmic solution Commonly known as: LIQUIFILM TEARS Place 1 drop into both eyes as needed for dry eyes.  Indication: Eye irritation   risperiDONE 1 MG tablet Commonly known as: RISPERDAL Take 1 tablet (1 mg total) by mouth at bedtime.  Indication: Psychosis   risperiDONE 0.5 MG tablet Commonly known as:  RISPERDAL Take 1 tablet (0.5 mg total) by mouth daily. Start taking on: June 12, 2019  Indication: Psychosis   sertraline 100 MG tablet Commonly known as: ZOLOFT Take 1 tablet (100 mg total) by mouth daily. Start taking on: June 12, 2019 What changed: how much to take  Indication: Major Depressive Disorder   sevelamer carbonate 800 MG tablet Commonly known as: RENVELA Take 2 tablets (1,600 mg total) by mouth 3 (three) times daily with meals. What changed:   how much to take  when to take this  additional instructions  Indication: High Amount of Phosphate in the Blood      Follow-up Information    pt declined referral Follow up.   Why: Pt declined stating plans to follow up with his neurologist. Contact information: Pt declined referral          Follow-up recommendations:  Activity:  Activity as tolerated Diet:  Heart healthy diet or as indicated by dialysis Other:  Follow-up with outpatient neurologic care  Comments: Prescription given at discharge  Signed: Alethia Berthold, MD 06/11/2019, 4:19 PM

## 2019-06-11 NOTE — B[facility] Group Notes (Signed)
LCSW Group Therapy Note   06/11/2019 1:00 PM  Type of Therapy and Topic:  Group Therapy:  Overcoming Obstacles   Participation Level:  Did Not Attend   Description of Group:    In this group patients will be encouraged to explore what they see as obstacles to their own wellness and recovery. They will be guided to discuss their thoughts, feelings, and behaviors related to these obstacles. The group will process together ways to cope with barriers, with attention given to specific choices patients can make. Each patient will be challenged to identify changes they are motivated to make in order to overcome their obstacles. This group will be process-oriented, with patients participating in exploration of their own experiences as well as giving and receiving support and challenge from other group members.   Therapeutic Goals: 1. Patient will identify personal and current obstacles as they relate to admission. 2. Patient will identify barriers that currently interfere with their wellness or overcoming obstacles.  3. Patient will identify feelings, thought process and behaviors related to these barriers. 4. Patient will identify two changes they are willing to make to overcome these obstacles:      Summary of Patient Progress X   Therapeutic Modalities:   Cognitive Behavioral Therapy Solution Focused Therapy Motivational Interviewing Relapse Prevention Therapy  Assunta Curtis, MSW, LCSW 06/11/2019 12:11 PM

## 2019-06-11 NOTE — Progress Notes (Signed)
  [facility] Adult Case Management Discharge Plan :  Will you be returning to the same living situation after discharge:  Yes,  pt plans to return to his home. At discharge, do you have transportation home?: Yes,  pt reports his wife will provide transportation. Do you have the ability to pay for your medications: Yes,  [facility]  Release of information consent forms completed and in the chart;  Patient's signature needed at discharge.  Patient to Follow up at: Follow-up Information    pt declined referral Follow up.   Why: Pt declined stating plans to follow up with his neurologist. Contact information: Pt declined referral          Next level of care provider has access to [place] and Suicide Prevention discussed: Yes,  SPE completed with wife  Have you used any form of tobacco in the last 30 days? (Cigarettes, Smokeless Tobacco, Cigars, and/or Pipes): No  Has patient been referred to the Quitline?: Patient refused referral  Patient has been referred for addiction treatment: Pt. refused referral  Rozann Lesches, LCSW 06/11/2019, 9:15 AM

## 2019-06-11 NOTE — BHH Counselor (Signed)
CSW staff met with the patient to discuss discharge.  Patient again declined aftercare recommendations made by [facility] staff, stating that he plans to follow up with his neurologist.  Assunta Curtis, MSW, LCSW 06/11/2019 9:14 AM

## 2019-06-11 NOTE — Progress Notes (Signed)
[facility]  ROUNDING NOTE   Subjective:   Seen and examined on hemodialysis treatment. Tolerating treatment well. Seated in chair.    HEMODIALYSIS FLOWSHEET:  Blood Flow Rate (mL/min): 400 mL/min Arterial Pressure (mmHg): -160 mmHg Venous Pressure (mmHg): 140 mmHg Transmembrane Pressure (mmHg): 80 mmHg Ultrafiltration Rate (mL/min): 670 mL/min Dialysate Flow Rate (mL/min): 600 ml/min Conductivity: Machine : 14 Conductivity: Machine : 14 Dialysis Fluid Bolus: Normal Saline Bolus Amount (mL): 250 mL    Objective:  Vital signs in last 24 hours:  Temp:  [98.8 F (37.1 C)-98.9 F (37.2 C)] 98.8 F (37.1 C) (01/18 1100) Pulse Rate:  [64-87] 71 (01/18 1345) Resp:  [18] 18 (01/18 1345) BP: (115-157)/(50-95) 138/58 (01/18 1345) SpO2:  [99 %-100 %] 100 % (01/18 1345) Weight:  [76.2 kg] 76.2 kg (01/18 1048)  Weight change:  Filed Weights   06/08/19 0930 06/08/19 1230 06/11/19 1048  Weight: 75.1 kg 74.3 kg 76.2 kg    Intake/Output: I/O last 3 completed shifts: In: 290 [P.O.:290] Out: -    Intake/Output this shift:  No intake/output data recorded.  Physical Exam: General: NAD, sitting in chair  Head: Normocephalic, atraumatic. Moist oral mucosal membranes  Eyes: Anicteric,    Neck: Supple, trachea midline  Lungs:  Clear to auscultation  Heart: Regular rate and rhythm  Abdomen:  Soft, nontender,   Extremities: no peripheral edema.  Neurologic: Nonfocal, moving all four extremities  Skin: No lesions  Access: Left AVF    Basic Metabolic Panel: Recent Labs  Lab 06/06/19 1004 06/08/19 0949  NA 139 137  K 3.9 4.2  CL 93* 95*  CO2 27 25  GLUCOSE 234* 128*  BUN 96* 66*  CREATININE 10.03* 7.06*  CALCIUM 9.5 9.0  PHOS 9.8* 6.5*    Liver Function Tests: Recent Labs  Lab 06/06/19 1004 06/08/19 0949  ALBUMIN 4.0 4.0   No results for input(s): LIPASE, AMYLASE in the last 168 hours. Recent Labs  Lab 06/06/19 1817  AMMONIA 21     CBC: Recent Labs  Lab 06/08/19 0949  WBC 5.0  HGB 12.0*  HCT 36.9*  MCV 100.5*  PLT 150    Cardiac Enzymes: No results for input(s): CKTOTAL, CKMB, CKMBINDEX, TROPONINI in the last 168 hours.  BNP: Invalid input(s): POCBNP  CBG: Recent Labs  Lab 06/06/19 1640 06/07/19 0649 06/07/19 1123 06/07/19 1624 06/08/19 0659  GLUCAP 100* 93 137* 85 74    Microbiology: Results for orders placed or performed during the hospital encounter of 06/01/19  Respiratory Panel by RT PCR (Flu A&B, Covid) - Nasopharyngeal Swab     Status: None   Collection Time: 06/01/19  9:52 PM   Specimen: Nasopharyngeal Swab  Result Value Ref Range Status   SARS Coronavirus 2 by RT PCR NEGATIVE NEGATIVE Final    Comment: (NOTE) SARS-CoV-2 target nucleic acids are NOT DETECTED. The SARS-CoV-2 RNA is generally detectable in upper respiratoy specimens during the acute phase of infection. The lowest concentration of SARS-CoV-2 viral copies this assay can detect is 131 copies/mL. A negative result does not preclude SARS-Cov-2 infection and should not be used as the sole basis for treatment or other patient management decisions. A negative result may occur with  improper specimen collection/handling, submission of specimen other than nasopharyngeal swab, presence of viral mutation(s) within the areas targeted by this assay, and inadequate number of viral copies (<131 copies/mL). A negative result must be combined with clinical observations, patient history, and epidemiological information. The expected result is Negative. Fact  Sheet for Patients:  PinkCheek.be Fact Sheet for Healthcare Providers:  GravelBags.it This test is not yet ap proved or cleared by the [place] FDA and  has been authorized for detection and/or diagnosis of SARS-CoV-2 by FDA under an Emergency Use Authorization (EUA). This EUA will remain  in effect (meaning this  test can be used) for the duration of the COVID-19 declaration under Section 564(b)(1) of the Act, 21 U.S.C. section 360bbb-3(b)(1), unless the authorization is terminated or revoked sooner.    Influenza A by PCR NEGATIVE NEGATIVE Final   Influenza B by PCR NEGATIVE NEGATIVE Final    Comment: (NOTE) The Xpert Xpress SARS-CoV-2/FLU/RSV assay is intended as an aid in  the diagnosis of influenza from Nasopharyngeal swab specimens and  should not be used as a sole basis for treatment. Nasal washings and  aspirates are unacceptable for Xpert Xpress SARS-CoV-2/FLU/RSV  testing. Fact Sheet for Patients: PinkCheek.be Fact Sheet for Healthcare Providers: GravelBags.it This test is not yet approved or cleared by the [place] FDA and  has been authorized for detection and/or diagnosis of SARS-CoV-2 by  FDA under an Emergency Use Authorization (EUA). This EUA will remain  in effect (meaning this test can be used) for the duration of the  Covid-19 declaration under Section 564(b)(1) of the Act, 21  U.S.C. section 360bbb-3(b)(1), unless the authorization is  terminated or revoked. Performed at [facility], [place] [address], [place],  [address]     Coagulation Studies: No results for input(s): LABPROT, INR in the last 72 hours.  Urinalysis: No results for input(s): COLORURINE, LABSPEC, PHURINE, GLUCOSEU, HGBUR, BILIRUBINUR, KETONESUR, PROTEINUR, UROBILINOGEN, NITRITE, LEUKOCYTESUR in the last 72 hours.  Invalid input(s): APPERANCEUR    Imaging: No results found.   Medications:   . sodium chloride    . sodium chloride     . allopurinol  100 mg Oral Daily  . aspirin  81 mg Oral Daily  . atorvastatin  40 mg Oral q1800  . calcitRIOL  0.25 mcg Oral Once per day on Mon Wed Fri  . docusate sodium  100 mg Oral BID  . heparin  2,000 Units Dialysis Once in dialysis  . heparin  2,000 Units Dialysis Once  in dialysis  . NIFEdipine  60 mg Oral Daily  . pioglitazone  30 mg Oral Daily  . risperiDONE  0.5 mg Oral Daily  . risperiDONE  1 mg Oral QHS  . sertraline  100 mg Oral Daily  . sevelamer carbonate  1,600 mg Oral TID WC   sodium chloride, sodium chloride, acetaminophen, alteplase, heparin, lidocaine (PF), lidocaine-prilocaine, pentafluoroprop-tetrafluoroeth  Assessment/ Plan:  [name] is a 74 y.o. white male with end stage renal disease on home hemodialysis, hepatitis C, hypertension, hearing loss with hearing aide, diabetes mellitus type II, depression, who was admitted to [facility] on 06/05/2019 for depression  [facility]) Home Hemodialysis  1. End Stage Renal Disease: seen and examined on hemodialysis treatment.  Placed on MWF schedule while inpatient.  Potential for discharge today Patient will resume Home HD with assistance from wife  2. Anemia of chronic kidney disease:   Lab Results  Component Value Date   HGB 12.0 (L) 06/08/2019  no indication for ESA at present  3. Secondary Hyperparathyroidism:  - sevelamer with meals.  - calcitriol Continue home regimen. No changes made   LOS: 6 Scott Jones 1/18/20212:00 PM

## 2019-06-11 NOTE — Plan of Care (Signed)
  Problem: Education: Goal: Knowledge of  General Education information/materials will improve Outcome: Progressing Goal: Emotional status will improve Outcome: Progressing Goal: Mental status will improve Outcome: Progressing Goal: Verbalization of understanding the information provided will improve Outcome: Progressing  D: Patient has been medication compliant. Denies SI, HI and AVH. Wife visited. States that she can pick patient up this afternoon if he is discharged today. A: Continue to monitor for safety R: Safety maintained.

## 2019-06-11 NOTE — Progress Notes (Signed)
Recreation Therapy Notes  Date: 06/11/2019  Time: 9:30 am  Location: Craft room  Behavioral response: Appropriate  Intervention Topic: Problem Solving   Discussion/Intervention:  Group content on today was focused on problem solving. The group described what problem solving is. Patients expressed how problems affect them and how they deal with problems. Individuals identified healthy ways to deal with problems. Patients explained what normally happens to them when they do not deal with problems. The group expressed reoccurring problems for them. The group participated in the intervention "Ways to Solve problems" where patients were given a chance to explore different ways to solve problems.  Clinical Observations/Feedback:  Patient came to group and defined problem solving as resolving conflict. He explained that he tries to solve his problems by being calm. Individual participated in the intervention during group and was social with peers and staff.  Sherryn Pollino LRT/CTRS         Mercedez Boule 06/11/2019 11:46 AM

## 2019-06-11 NOTE — Progress Notes (Signed)
Patient denies SI/HI, denies A/V hallucinations. Patient verbalizes understanding of discharge instructions, follow up care and prescriptions. Patient given all belongings from [facility] locker. Patient escorted out by staff, transported by family. 

## 2019-06-13 ENCOUNTER — Encounter: Payer: Self-pay | Admitting: *Deleted

## 2019-06-14 ENCOUNTER — Telehealth: Payer: Self-pay

## 2019-06-14 ENCOUNTER — Ambulatory Visit: Payer: PPO | Admitting: Neurology

## 2019-06-18 ENCOUNTER — Telehealth: Payer: Self-pay | Admitting: Family Medicine

## 2019-06-18 NOTE — Telephone Encounter (Signed)
Scott Jones dropped off Death Certificate to be filled out by provider. Document given to CMA. Please call [facility] when document done to pick up at [phone].

## 2019-06-18 NOTE — Telephone Encounter (Signed)
Called again, did not reach and no message available  We will send letter to the house

## 2019-06-18 NOTE — Telephone Encounter (Signed)
I have called them to let them know its ready for pick up

## 2019-06-21 ENCOUNTER — Encounter (HOSPITAL_COMMUNITY): Payer: Self-pay | Admitting: *Deleted

## 2019-06-21 NOTE — Progress Notes (Signed)
Patient had been referred to pulmonary rehab by the [facility], after reviewing his chart patient expired, will close referral.  I called Cleone Slim at [phone] at the [facility] in [place] to alert them of patient's passing.

## 2019-06-25 NOTE — Telephone Encounter (Signed)
Called again, did not reach. Left message for Barnetta Chapel on [place]

## 2019-06-25 NOTE — Telephone Encounter (Signed)
Returned call to wife and offered condolences for her loss. She stated that because her husband was to be seen today as a new patient, she wanted to know if Dr Jannifer Franklin received MRI report ordered by psychiatrist. I advised her he received a packet of information but not a disk. She stated she still wanted Dr Jannifer Franklin to read the MRI and give her a report. I advised her the ordering MD can give her the results. She stated she "wasn't sure". She then put her daughter on the phone. Daughter asked if Dr Jannifer Franklin would be willing to get MRI disk and give his opinion. She stated Vascular dementia was mentioned but they want to know. I advised I'll send Dr Jannifer Franklin this note. She  verbalized understanding, appreciation.

## 2019-06-25 NOTE — Telephone Encounter (Signed)
PCP spoke with them to give verbal order for death certificate.

## 2019-06-25 NOTE — Telephone Encounter (Signed)
I called the wife.  The MRI of the brain was reviewed, this shows nonspecific cortical atrophy, minimal white matter changes are seen.  The study could be consistent with an Alzheimer's process.  The patient was to be seen here at this office, but he has since passed away.

## 2019-06-25 NOTE — Telephone Encounter (Signed)
[name] from [facility] in needing Dr. Lorelei Pont  To do a  verbal authorization to sign a deaf certificate for her patient. Please give [name]  A call back as soon as possible at [phone] She is waiting there with the body. She can not remove the body until she get's the verbal authorization thanks.

## 2019-06-25 NOTE — Telephone Encounter (Signed)
Patient wife called to inform her husband passed today and would like to know if our office received the MRI scans that were sent over from the hospital. Please follow up

## 2019-06-25 NOTE — Telephone Encounter (Signed)
I spoke with Ms. Wynetta Emery, [facility] representative.  The body can be released, I will sign death cert.  Called Barnetta Chapel but could not reach her at this time

## 2019-06-25 DEATH — deceased

## 2020-08-31 IMAGING — DX LUMBAR SPINE - COMPLETE 4+ VIEW
5 series · 5 of 5 positions shown · non-contrast
Comparison: Lumbar radiographs 03/28/2017

CLINICAL DATA: Acute midline thoracic back pain.  Rule out fracture

EXAM:
LUMBAR SPINE - COMPLETE 4+ VIEW

[l-spine ap]
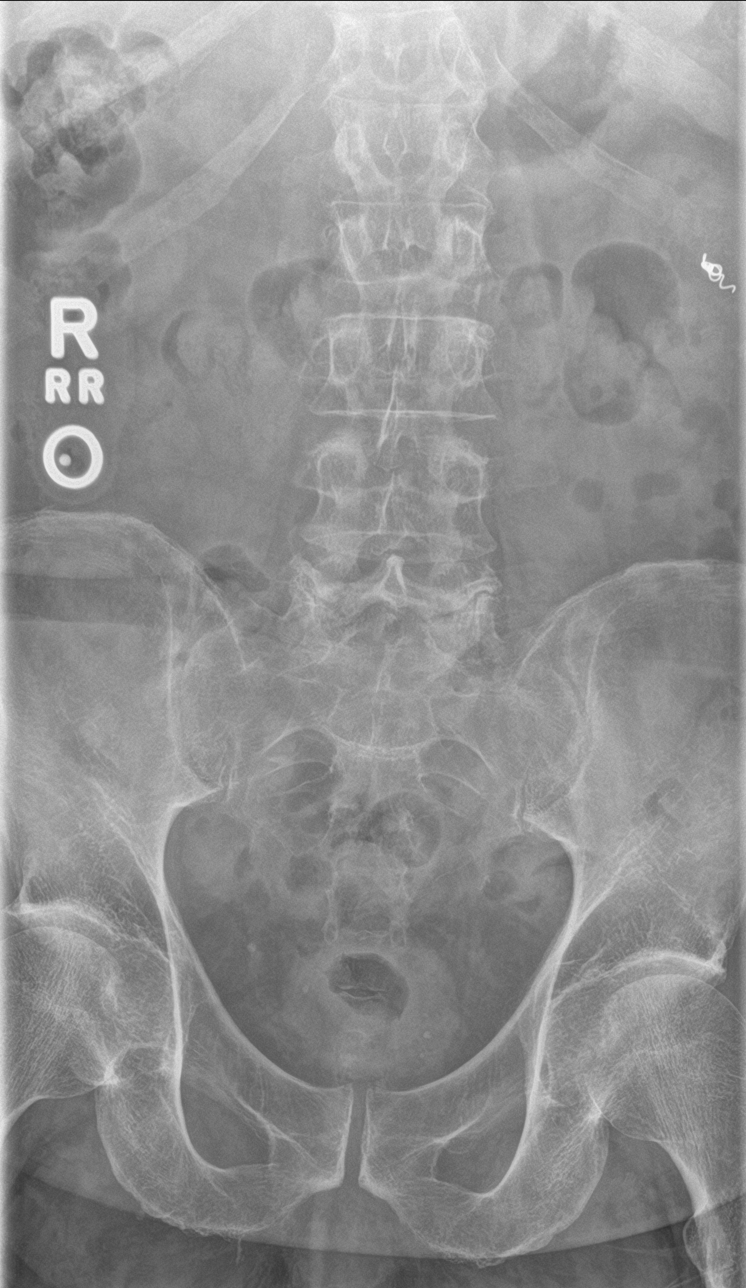

[l-spine obl (1 of 2)]
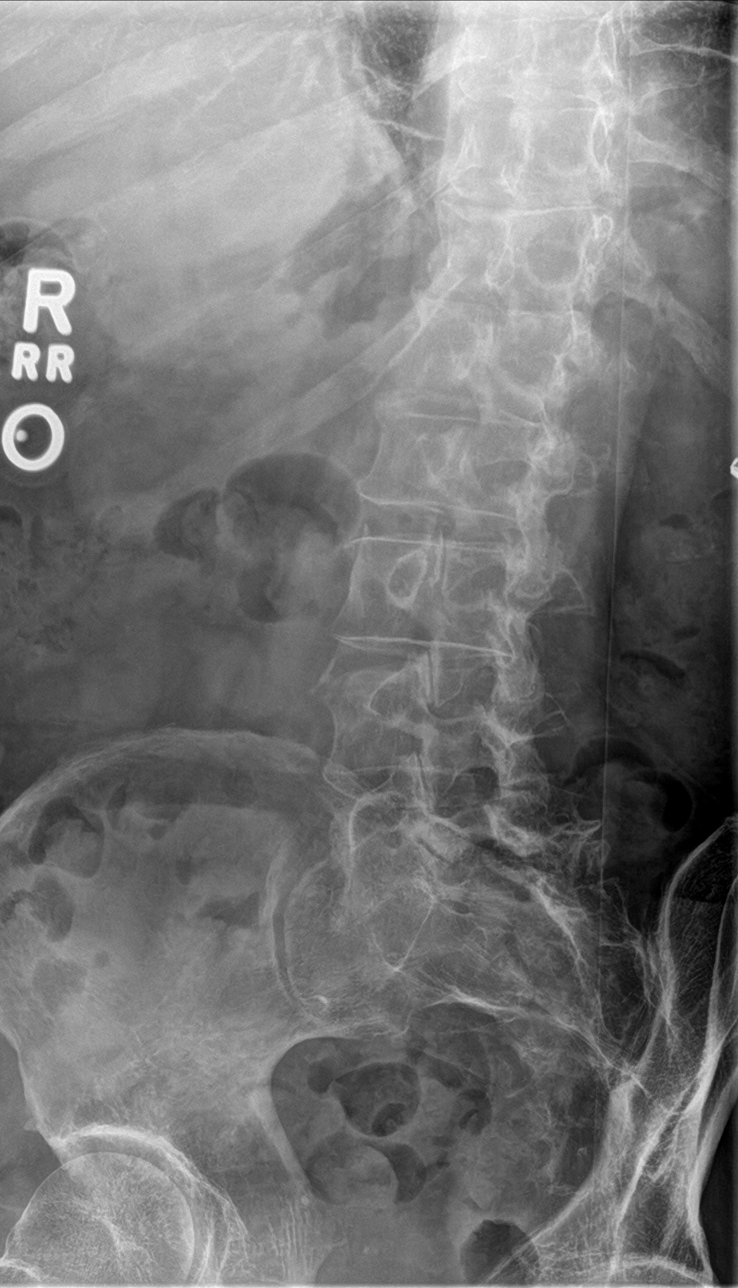

[l-spine obl (2 of 2)]
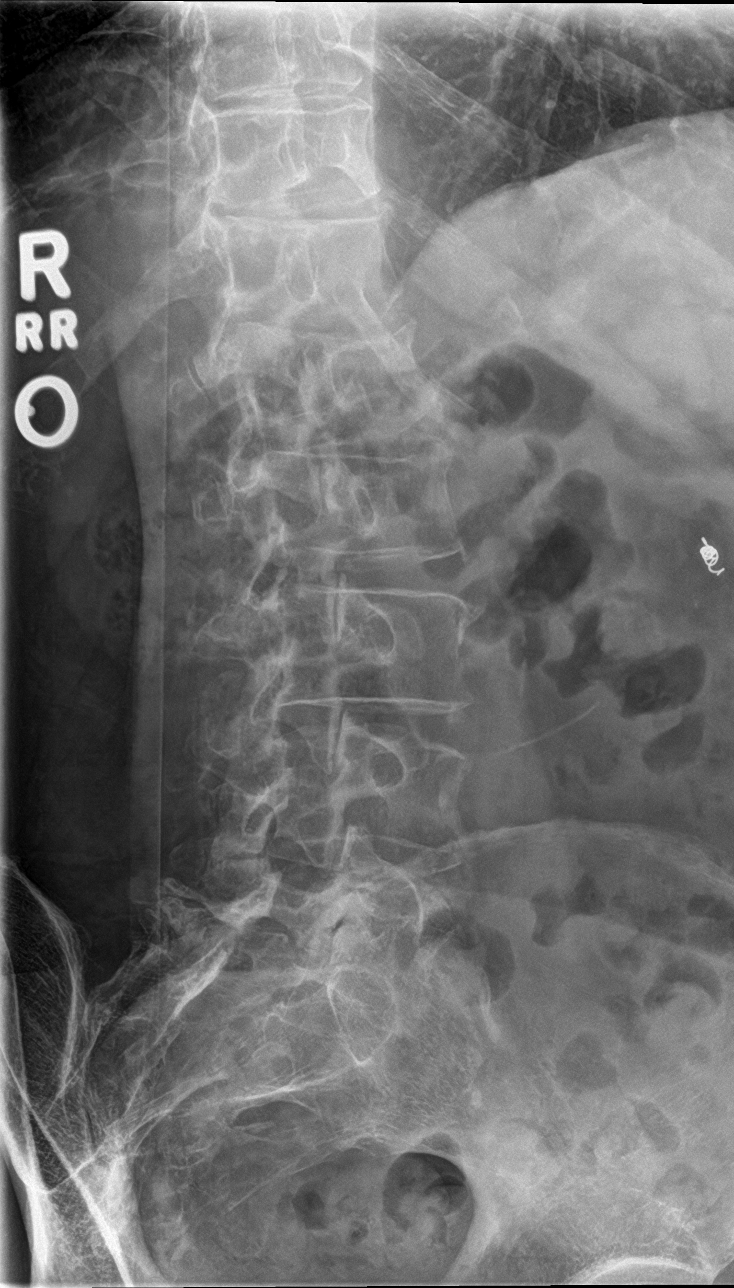

[l-spine lat]
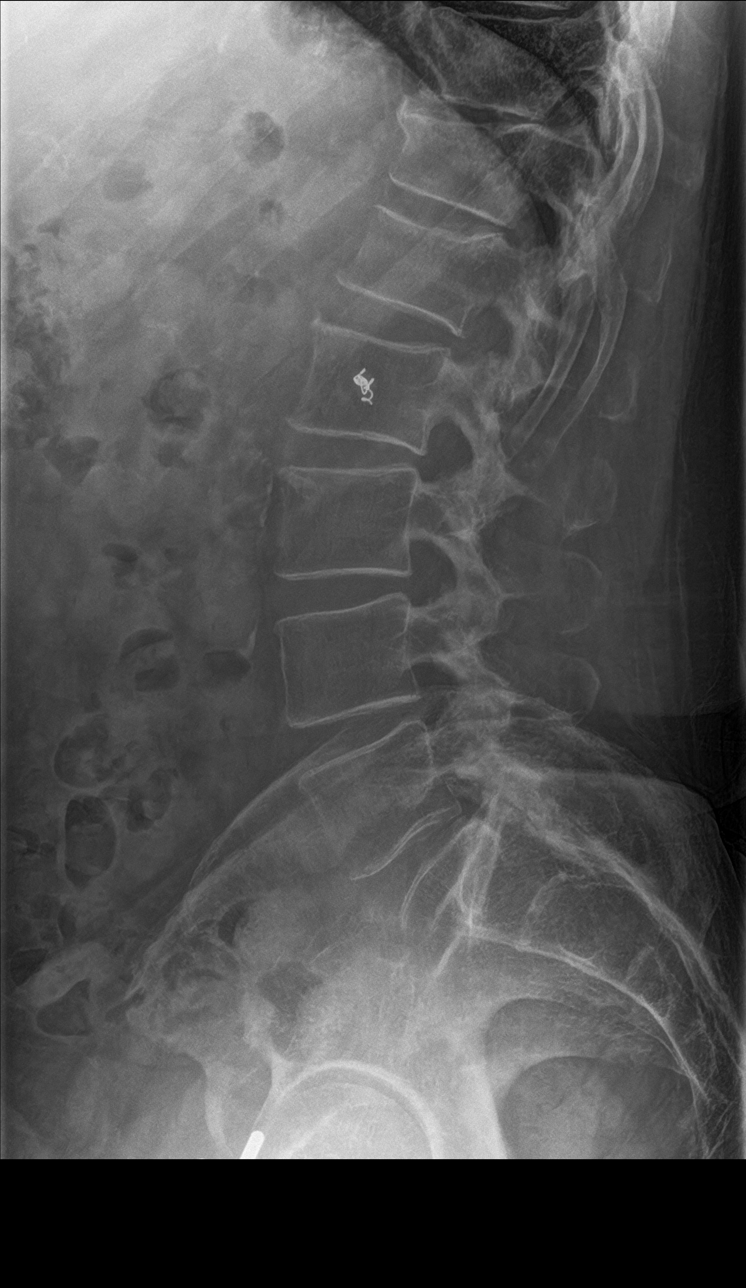

[l-spine spot]
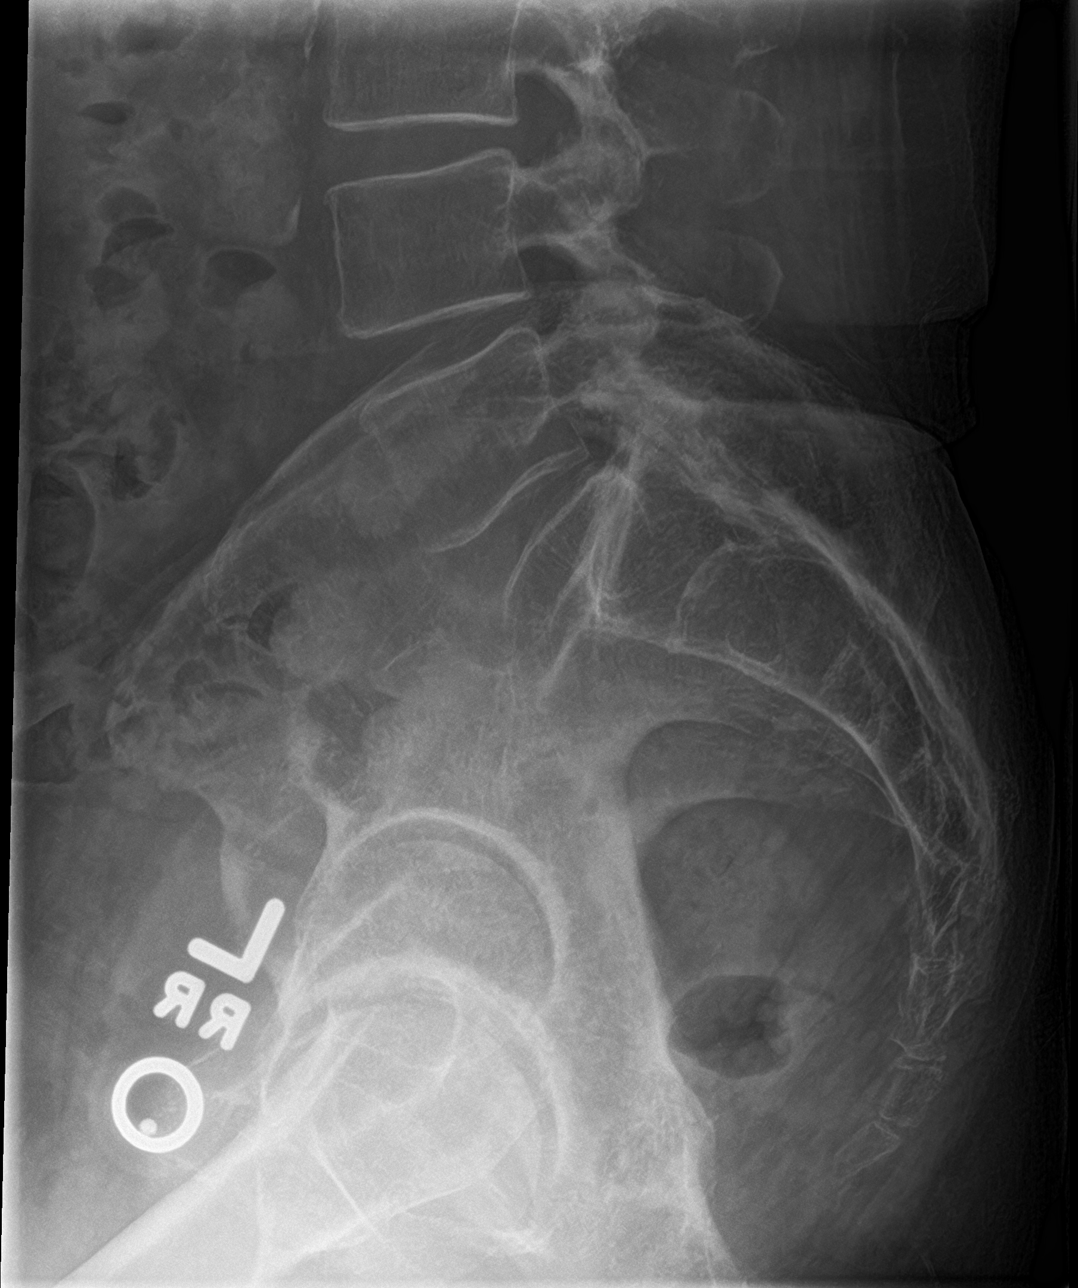

[5 of 5 positions shown; findings below may reference images not displayed]

FINDINGS: Chronic compression fracture T12 unchanged. New compression fracture
superior endplate of L1. Mild fracture of L1 which is likely
chronic. No other fracture. Generalized osteopenia.

Normal alignment.  No significant disc space narrowing.

Metal coils in the left upper abdomen possibly in the kidney.
IMPRESSION: Mild superior endplate compression fracture L1 likely acute

Chronic compression fracture T12

## 2020-08-31 IMAGING — DX THORACIC SPINE 2 VIEWS
3 series · 3 of 3 positions shown · non-contrast
Comparison: Chest two-view 04/03/2018

CLINICAL DATA: Acute midline thoracic pain.

EXAM:
THORACIC SPINE 2 VIEWS

[t-spine ap]
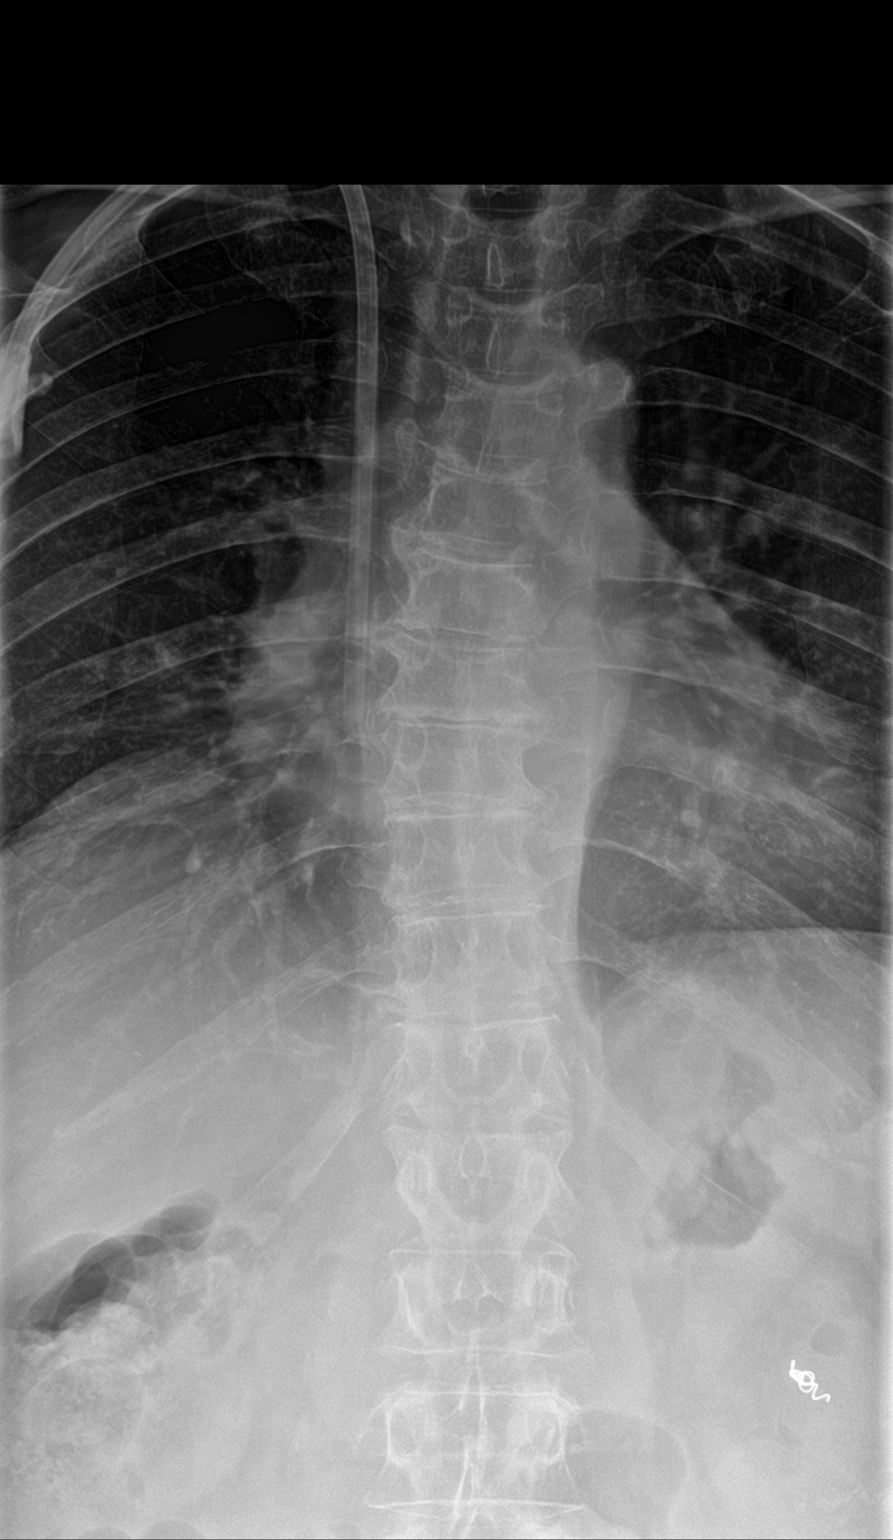

[t-spine lat]
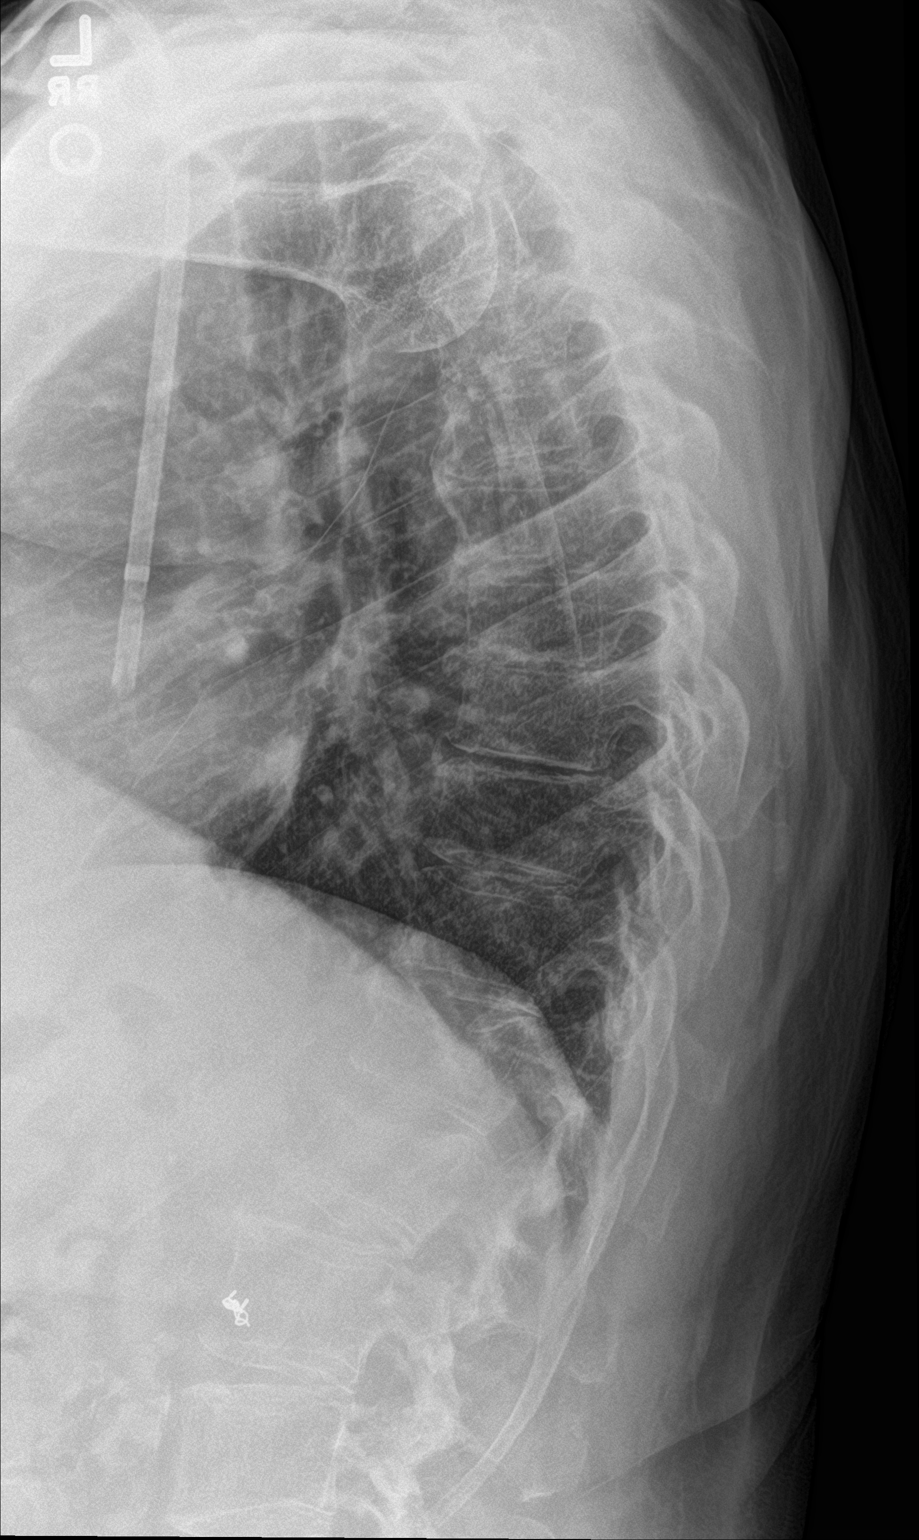

[t-spine swimmers]
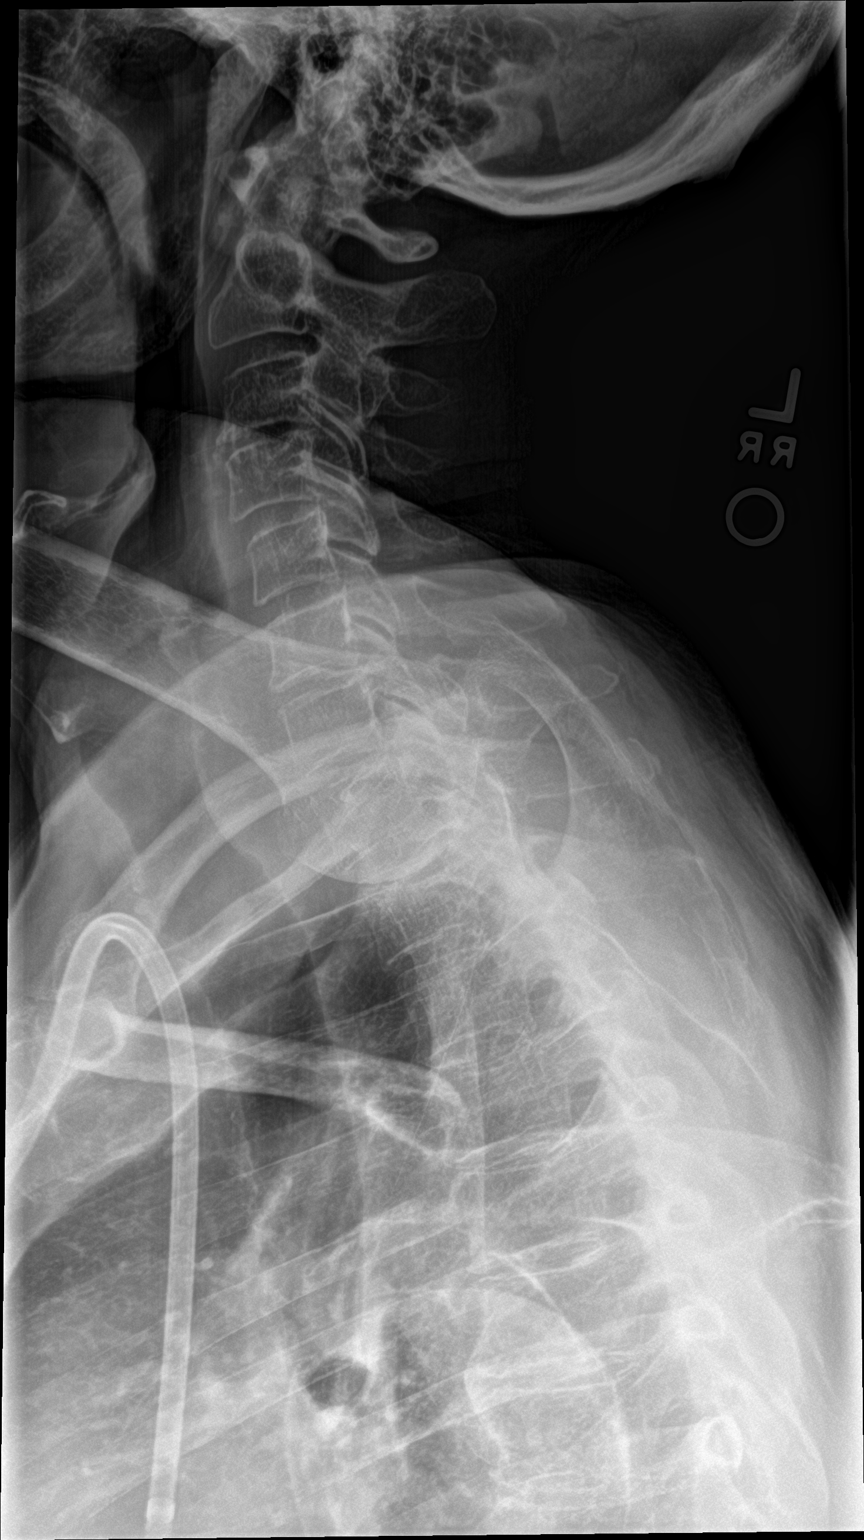

[3 of 3 positions shown; findings below may reference images not displayed]

FINDINGS: Chronic compression fracture T12. Compression fracture superior
endplate of L1 was not present previously. See lumbar spine report.
No other thoracic fracture.

Right jugular central venous catheter tip in the right atrium.
IMPRESSION: Chronic compression fracture T12

New fracture L1.
